# Patient Record
Sex: Male | Born: 1950 | Race: Black or African American | Hispanic: No | Marital: Married | Smoking: Former smoker
Health system: Southern US, Community
[De-identification: ages and names within clinical notes are randomized; demographics above are authoritative.]

## PROBLEM LIST (undated history)

## (undated) DIAGNOSIS — I10 Essential (primary) hypertension: Secondary | ICD-10-CM

## (undated) DIAGNOSIS — E119 Type 2 diabetes mellitus without complications: Secondary | ICD-10-CM

## (undated) DIAGNOSIS — I639 Cerebral infarction, unspecified: Secondary | ICD-10-CM

## (undated) DIAGNOSIS — R569 Unspecified convulsions: Secondary | ICD-10-CM

## (undated) HISTORY — PX: OTHER SURGICAL HISTORY: SHX169

---

## 2002-06-01 ENCOUNTER — Emergency Department (HOSPITAL_COMMUNITY): Admission: EM | Admit: 2002-06-01 | Discharge: 2002-06-01 | Payer: Self-pay | Admitting: Emergency Medicine

## 2002-06-11 ENCOUNTER — Emergency Department (HOSPITAL_COMMUNITY): Admission: EM | Admit: 2002-06-11 | Discharge: 2002-06-11 | Payer: Self-pay | Admitting: *Deleted

## 2002-06-22 ENCOUNTER — Emergency Department (HOSPITAL_COMMUNITY): Admission: EM | Admit: 2002-06-22 | Discharge: 2002-06-23 | Payer: Self-pay | Admitting: Emergency Medicine

## 2002-06-23 ENCOUNTER — Encounter: Payer: Self-pay | Admitting: Emergency Medicine

## 2002-06-26 ENCOUNTER — Ambulatory Visit (HOSPITAL_COMMUNITY): Admission: RE | Admit: 2002-06-26 | Discharge: 2002-06-26 | Payer: Self-pay | Admitting: Family Medicine

## 2002-06-26 ENCOUNTER — Encounter: Payer: Self-pay | Admitting: Family Medicine

## 2002-08-12 ENCOUNTER — Ambulatory Visit (HOSPITAL_COMMUNITY): Admission: RE | Admit: 2002-08-12 | Discharge: 2002-08-12 | Payer: Self-pay | Admitting: Gastroenterology

## 2003-06-23 ENCOUNTER — Emergency Department (HOSPITAL_COMMUNITY): Admission: EM | Admit: 2003-06-23 | Discharge: 2003-06-23 | Payer: Self-pay | Admitting: Emergency Medicine

## 2003-10-24 ENCOUNTER — Other Ambulatory Visit: Payer: Self-pay

## 2008-09-10 ENCOUNTER — Ambulatory Visit: Payer: Self-pay | Admitting: Gastroenterology

## 2009-10-30 ENCOUNTER — Emergency Department: Payer: Self-pay | Admitting: Internal Medicine

## 2013-02-26 ENCOUNTER — Ambulatory Visit: Payer: Self-pay | Admitting: Family

## 2013-05-16 ENCOUNTER — Ambulatory Visit: Payer: Self-pay | Admitting: Family

## 2013-09-10 ENCOUNTER — Ambulatory Visit: Payer: Self-pay | Admitting: Family

## 2013-10-28 DIAGNOSIS — R519 Headache, unspecified: Secondary | ICD-10-CM | POA: Insufficient documentation

## 2013-10-28 DIAGNOSIS — R55 Syncope and collapse: Secondary | ICD-10-CM | POA: Insufficient documentation

## 2013-10-28 DIAGNOSIS — Z8673 Personal history of transient ischemic attack (TIA), and cerebral infarction without residual deficits: Secondary | ICD-10-CM | POA: Insufficient documentation

## 2014-05-02 DIAGNOSIS — R569 Unspecified convulsions: Secondary | ICD-10-CM | POA: Insufficient documentation

## 2014-06-20 ENCOUNTER — Other Ambulatory Visit: Payer: Self-pay | Admitting: Family Medicine

## 2014-06-20 DIAGNOSIS — R55 Syncope and collapse: Secondary | ICD-10-CM

## 2014-06-23 ENCOUNTER — Other Ambulatory Visit: Payer: Self-pay

## 2014-06-23 ENCOUNTER — Ambulatory Visit: Payer: Medicare (Managed Care) | Attending: Family Medicine

## 2014-06-23 DIAGNOSIS — R55 Syncope and collapse: Secondary | ICD-10-CM | POA: Insufficient documentation

## 2015-06-14 ENCOUNTER — Inpatient Hospital Stay
Admission: EM | Admit: 2015-06-14 | Discharge: 2015-06-18 | DRG: 640 | Disposition: A | Discharge status: Home / Self Care | Payer: Medicare (Managed Care) | Attending: Internal Medicine | Admitting: Internal Medicine

## 2015-06-14 ENCOUNTER — Encounter: Payer: Self-pay | Admitting: Emergency Medicine

## 2015-06-14 ENCOUNTER — Emergency Department: Payer: Medicare (Managed Care)

## 2015-06-14 ENCOUNTER — Inpatient Hospital Stay: Payer: Medicare (Managed Care)

## 2015-06-14 DIAGNOSIS — I10 Essential (primary) hypertension: Secondary | ICD-10-CM | POA: Diagnosis present

## 2015-06-14 DIAGNOSIS — R0602 Shortness of breath: Secondary | ICD-10-CM

## 2015-06-14 DIAGNOSIS — N179 Acute kidney failure, unspecified: Secondary | ICD-10-CM | POA: Diagnosis present

## 2015-06-14 DIAGNOSIS — Z79899 Other long term (current) drug therapy: Secondary | ICD-10-CM | POA: Diagnosis not present

## 2015-06-14 DIAGNOSIS — R748 Abnormal levels of other serum enzymes: Secondary | ICD-10-CM | POA: Diagnosis present

## 2015-06-14 DIAGNOSIS — E119 Type 2 diabetes mellitus without complications: Secondary | ICD-10-CM | POA: Diagnosis present

## 2015-06-14 DIAGNOSIS — Z8673 Personal history of transient ischemic attack (TIA), and cerebral infarction without residual deficits: Secondary | ICD-10-CM

## 2015-06-14 DIAGNOSIS — Z809 Family history of malignant neoplasm, unspecified: Secondary | ICD-10-CM | POA: Diagnosis not present

## 2015-06-14 DIAGNOSIS — A419 Sepsis, unspecified organism: Secondary | ICD-10-CM | POA: Diagnosis present

## 2015-06-14 DIAGNOSIS — G8929 Other chronic pain: Secondary | ICD-10-CM | POA: Diagnosis present

## 2015-06-14 DIAGNOSIS — G9341 Metabolic encephalopathy: Secondary | ICD-10-CM | POA: Diagnosis present

## 2015-06-14 DIAGNOSIS — Z7901 Long term (current) use of anticoagulants: Secondary | ICD-10-CM | POA: Diagnosis not present

## 2015-06-14 DIAGNOSIS — I482 Chronic atrial fibrillation: Secondary | ICD-10-CM | POA: Diagnosis present

## 2015-06-14 DIAGNOSIS — Z833 Family history of diabetes mellitus: Secondary | ICD-10-CM

## 2015-06-14 DIAGNOSIS — G500 Trigeminal neuralgia: Secondary | ICD-10-CM | POA: Diagnosis present

## 2015-06-14 DIAGNOSIS — I4892 Unspecified atrial flutter: Secondary | ICD-10-CM | POA: Diagnosis present

## 2015-06-14 DIAGNOSIS — R410 Disorientation, unspecified: Secondary | ICD-10-CM

## 2015-06-14 DIAGNOSIS — R4182 Altered mental status, unspecified: Secondary | ICD-10-CM | POA: Diagnosis not present

## 2015-06-14 DIAGNOSIS — E860 Dehydration: Principal | ICD-10-CM | POA: Diagnosis present

## 2015-06-14 HISTORY — DX: Type 2 diabetes mellitus without complications: E11.9

## 2015-06-14 HISTORY — DX: Essential (primary) hypertension: I10

## 2015-06-14 HISTORY — DX: Cerebral infarction, unspecified: I63.9

## 2015-06-14 LAB — COMPREHENSIVE METABOLIC PANEL
ALBUMIN: 4 g/dL (ref 3.5–5.0)
ALK PHOS: 74 U/L (ref 38–126)
ALT: 13 U/L — AB (ref 17–63)
ALT: 15 U/L — AB (ref 17–63)
AST: 20 U/L (ref 15–41)
AST: 23 U/L (ref 15–41)
Albumin: 4.3 g/dL (ref 3.5–5.0)
Alkaline Phosphatase: 66 U/L (ref 38–126)
Anion gap: 12 (ref 5–15)
Anion gap: 11 (ref 5–15)
BILIRUBIN TOTAL: 0.5 mg/dL (ref 0.3–1.2)
BUN: 30 mg/dL — AB (ref 6–20)
BUN: 33 mg/dL — AB (ref 6–20)
CHLORIDE: 102 mmol/L (ref 101–111)
CO2: 27 mmol/L (ref 22–32)
CO2: 26 mmol/L (ref 22–32)
CREATININE: 1.57 mg/dL — AB (ref 0.61–1.24)
Calcium: 10 mg/dL (ref 8.9–10.3)
Calcium: 9.5 mg/dL (ref 8.9–10.3)
Chloride: 103 mmol/L (ref 101–111)
Creatinine, Ser: 1.49 mg/dL — ABNORMAL HIGH (ref 0.61–1.24)
GFR calc Af Amer: 52 mL/min — ABNORMAL LOW (ref >60)
GFR calc Af Amer: 55 mL/min — ABNORMAL LOW (ref >60)
GFR, EST NON AFRICAN AMERICAN: 45 mL/min — AB (ref >60)
GFR, EST NON AFRICAN AMERICAN: 48 mL/min — AB (ref >60)
Glucose, Bld: 178 mg/dL — ABNORMAL HIGH (ref 65–99)
Glucose, Bld: 196 mg/dL — ABNORMAL HIGH (ref 65–99)
POTASSIUM: 3.8 mmol/L (ref 3.5–5.1)
Potassium: 4.1 mmol/L (ref 3.5–5.1)
SODIUM: 139 mmol/L (ref 135–145)
Sodium: 142 mmol/L (ref 135–145)
TOTAL PROTEIN: 8.4 g/dL — AB (ref 6.5–8.1)
Total Bilirubin: 0.5 mg/dL (ref 0.3–1.2)
Total Protein: 7.8 g/dL (ref 6.5–8.1)

## 2015-06-14 LAB — LACTIC ACID, PLASMA
LACTIC ACID, VENOUS: 3.3 mmol/L — AB (ref 0.5–2.0)
Lactic Acid, Venous: 1.5 mmol/L (ref 0.5–2.0)

## 2015-06-14 LAB — CBC WITH DIFFERENTIAL/PLATELET
BASOS ABS: 0.1 10*3/uL (ref 0–0.1)
BASOS PCT: 1 %
Basophils Absolute: 0.1 10*3/uL (ref 0–0.1)
Basophils Relative: 1 %
EOS ABS: 0 10*3/uL (ref 0–0.7)
EOS PCT: 1 %
Eosinophils Absolute: 0.1 10*3/uL (ref 0–0.7)
Eosinophils Relative: 0 %
HCT: 45.8 % (ref 40.0–52.0)
HCT: 43.7 % (ref 40.0–52.0)
HEMOGLOBIN: 14.6 g/dL (ref 13.0–18.0)
Hemoglobin: 14 g/dL (ref 13.0–18.0)
LYMPHS ABS: 0.5 10*3/uL — AB (ref 1.0–3.6)
Lymphocytes Relative: 15 %
Lymphocytes Relative: 8 %
Lymphs Abs: 1.3 10*3/uL (ref 1.0–3.6)
MCH: 29.2 pg (ref 26.0–34.0)
MCH: 29.3 pg (ref 26.0–34.0)
MCHC: 31.8 g/dL — ABNORMAL LOW (ref 32.0–36.0)
MCHC: 32 g/dL (ref 32.0–36.0)
MCV: 91.3 fL (ref 80.0–100.0)
MCV: 92.3 fL (ref 80.0–100.0)
MONO ABS: 0.4 10*3/uL (ref 0.2–1.0)
MONO ABS: 0.9 10*3/uL (ref 0.2–1.0)
Monocytes Relative: 10 %
Monocytes Relative: 6 %
NEUTROS PCT: 85 %
Neutro Abs: 6.1 10*3/uL (ref 1.4–6.5)
Neutro Abs: 5.8 10*3/uL (ref 1.4–6.5)
Neutrophils Relative %: 73 %
PLATELETS: 162 10*3/uL (ref 150–440)
Platelets: 199 10*3/uL (ref 150–440)
RBC: 4.96 MIL/uL (ref 4.40–5.90)
RBC: 4.79 MIL/uL (ref 4.40–5.90)
RDW: 15.6 % — ABNORMAL HIGH (ref 11.5–14.5)
RDW: 15.7 % — AB (ref 11.5–14.5)
WBC: 6.8 10*3/uL (ref 3.8–10.6)
WBC: 8.5 10*3/uL (ref 3.8–10.6)

## 2015-06-14 LAB — BLOOD GAS, ARTERIAL
Acid-Base Excess: 0.9 mmol/L (ref 0.0–3.0)
Allens test (pass/fail): POSITIVE — AB
Bicarbonate: 24.2 mEq/L (ref 21.0–28.0)
FIO2: 0.21
O2 Saturation: 97.4 %
Patient temperature: 37
pCO2 arterial: 34 mmHg (ref 32.0–48.0)
pH, Arterial: 7.46 — ABNORMAL HIGH (ref 7.350–7.450)
pO2, Arterial: 90 mmHg (ref 83.0–108.0)

## 2015-06-14 LAB — URINE DRUG SCREEN, QUALITATIVE (ARMC ONLY)
AMPHETAMINES, UR SCREEN: NOT DETECTED
BARBITURATES, UR SCREEN: NOT DETECTED
BENZODIAZEPINE, UR SCRN: NOT DETECTED
CANNABINOID 50 NG, UR ~~LOC~~: NOT DETECTED
Cocaine Metabolite,Ur ~~LOC~~: POSITIVE — AB
MDMA (Ecstasy)Ur Screen: NOT DETECTED
Methadone Scn, Ur: NOT DETECTED
OPIATE, UR SCREEN: NOT DETECTED
PHENCYCLIDINE (PCP) UR S: NOT DETECTED
Tricyclic, Ur Screen: POSITIVE — AB

## 2015-06-14 LAB — URINALYSIS COMPLETE WITH MICROSCOPIC (ARMC ONLY)
BILIRUBIN URINE: NEGATIVE
GLUCOSE, UA: NEGATIVE mg/dL
Hgb urine dipstick: NEGATIVE
KETONES UR: NEGATIVE mg/dL
Nitrite: NEGATIVE
PROTEIN: 100 mg/dL — AB
SPECIFIC GRAVITY, URINE: 1.02 (ref 1.005–1.030)
pH: 5 (ref 5.0–8.0)

## 2015-06-14 LAB — PROTIME-INR
INR: 1.37
INR: 1.44
INR: 1.43
Prothrombin Time: 17 seconds — ABNORMAL HIGH (ref 11.4–15.0)
Prothrombin Time: 17.6 seconds — ABNORMAL HIGH (ref 11.4–15.0)
Prothrombin Time: 17.5 seconds — ABNORMAL HIGH (ref 11.4–15.0)

## 2015-06-14 LAB — APTT: APTT: 30 s (ref 24–36)

## 2015-06-14 LAB — PROCALCITONIN: PROCALCITONIN: 0.12 ng/mL

## 2015-06-14 LAB — ETHANOL: Alcohol, Ethyl (B): <5 mg/dL (ref <5)

## 2015-06-14 LAB — CK: Total CK: 54 U/L (ref 49–397)

## 2015-06-14 LAB — GLUCOSE, CAPILLARY: Glucose-Capillary: 177 mg/dL — ABNORMAL HIGH (ref 65–99)

## 2015-06-14 LAB — TROPONIN I: TROPONIN I: 0.09 ng/mL — AB (ref <0.031)

## 2015-06-14 MED ORDER — FELODIPINE ER 5 MG PO TB24
10.0000 mg | ORAL_TABLET | Freq: Two times a day (BID) | ORAL | Status: DC
  Administered 2015-06-16 – 2015-06-18 (×5): 10 mg via ORAL
  Filled 2015-06-14: qty 1
  Filled 2015-06-14 (×2): qty 2
  Filled 2015-06-14 (×3): qty 1
  Filled 2015-06-14 (×4): qty 2

## 2015-06-14 MED ORDER — SODIUM CHLORIDE 0.9 % IV SOLN
INTRAVENOUS | Status: DC
  Administered 2015-06-14 – 2015-06-17 (×7): via INTRAVENOUS

## 2015-06-14 MED ORDER — METOPROLOL SUCCINATE ER 50 MG PO TB24
150.0000 mg | ORAL_TABLET | Freq: Every day | ORAL | Status: DC
  Administered 2015-06-16 – 2015-06-17 (×2): 150 mg via ORAL
  Filled 2015-06-14 (×2): qty 1

## 2015-06-14 MED ORDER — ONDANSETRON HCL 4 MG/2ML IJ SOLN: 4.0000 mg | Freq: Four times a day (QID) | INTRAMUSCULAR | Status: DC | PRN

## 2015-06-14 MED ORDER — DEXTROSE 5 % IV SOLN
1.0000 g | INTRAVENOUS | Status: DC
  Administered 2015-06-14: 1 g via INTRAVENOUS
  Filled 2015-06-14 (×2): qty 10

## 2015-06-14 MED ORDER — DEXTROSE 5 % IV SOLN: 1.0000 g | INTRAVENOUS | Status: DC

## 2015-06-14 MED ORDER — HYDRALAZINE HCL 20 MG/ML IJ SOLN
10.0000 mg | Freq: Four times a day (QID) | INTRAMUSCULAR | Status: DC | PRN
  Administered 2015-06-14: 10 mg via INTRAVENOUS
  Filled 2015-06-14: qty 1

## 2015-06-14 MED ORDER — ISOSORBIDE DINITRATE 20 MG PO TABS
40.0000 mg | ORAL_TABLET | Freq: Two times a day (BID) | ORAL | Status: DC
  Administered 2015-06-15 – 2015-06-18 (×6): 40 mg via ORAL
  Filled 2015-06-14 (×10): qty 2

## 2015-06-14 MED ORDER — RENA-VITE PO TABS
1.0000 | ORAL_TABLET | Freq: Every day | ORAL | Status: DC
  Administered 2015-06-16 – 2015-06-18 (×3): 1 via ORAL
  Filled 2015-06-14 (×5): qty 1

## 2015-06-14 MED ORDER — LORAZEPAM 2 MG/ML IJ SOLN
1.0000 mg | Freq: Once | INTRAMUSCULAR | Status: AC
  Administered 2015-06-14: 1 mg via INTRAVENOUS
  Filled 2015-06-14: qty 1

## 2015-06-14 MED ORDER — LORAZEPAM 2 MG/ML IJ SOLN
2.0000 mg | Freq: Four times a day (QID) | INTRAMUSCULAR | Status: DC | PRN
  Administered 2015-06-14 – 2015-06-15 (×3): 2 mg via INTRAVENOUS
  Filled 2015-06-14 (×3): qty 1

## 2015-06-14 MED ORDER — SODIUM CHLORIDE 0.9 % IV BOLUS (SEPSIS): 500.0000 mL | INTRAVENOUS | Status: AC

## 2015-06-14 MED ORDER — CITALOPRAM HYDROBROMIDE 10 MG PO TABS
10.0000 mg | ORAL_TABLET | Freq: Every day | ORAL | Status: DC
  Administered 2015-06-16 – 2015-06-18 (×3): 10 mg via ORAL
  Filled 2015-06-14 (×3): qty 1

## 2015-06-14 MED ORDER — WARFARIN - PHARMACIST DOSING INPATIENT
Freq: Every day | Status: DC
  Administered 2015-06-15 – 2015-06-17 (×2)

## 2015-06-14 MED ORDER — VANCOMYCIN HCL IN DEXTROSE 1-5 GM/200ML-% IV SOLN
1000.0000 mg | Freq: Once | INTRAVENOUS | Status: AC
  Administered 2015-06-14: 1000 mg via INTRAVENOUS
  Filled 2015-06-14: qty 200

## 2015-06-14 MED ORDER — ACETAMINOPHEN 325 MG PO TABS
650.0000 mg | ORAL_TABLET | Freq: Four times a day (QID) | ORAL | Status: DC | PRN
  Administered 2015-06-15 – 2015-06-16 (×2): 650 mg via ORAL
  Filled 2015-06-14 (×2): qty 2

## 2015-06-14 MED ORDER — SODIUM CHLORIDE 0.9 % IV BOLUS (SEPSIS): 1000.0000 mL | INTRAVENOUS | Status: AC

## 2015-06-14 MED ORDER — HALOPERIDOL LACTATE 5 MG/ML IJ SOLN
5.0000 mg | Freq: Once | INTRAMUSCULAR | Status: AC
  Administered 2015-06-14: 5 mg via INTRAVENOUS

## 2015-06-14 MED ORDER — HYDRALAZINE HCL 50 MG PO TABS
75.0000 mg | ORAL_TABLET | Freq: Two times a day (BID) | ORAL | Status: DC
  Administered 2015-06-15 – 2015-06-18 (×6): 75 mg via ORAL
  Filled 2015-06-14 (×6): qty 1

## 2015-06-14 MED ORDER — DEXTROSE 5 % IV SOLN: 2.0000 g | Freq: Once | INTRAVENOUS | Status: DC

## 2015-06-14 MED ORDER — TIMOLOL MALEATE 0.5 % OP SOLN
1.0000 [drp] | Freq: Every day | OPHTHALMIC | Status: DC
  Administered 2015-06-14 – 2015-06-17 (×4): 1 [drp] via OPHTHALMIC
  Filled 2015-06-14: qty 5

## 2015-06-14 MED ORDER — MAGNESIUM OXIDE 400 (241.3 MG) MG PO TABS
400.0000 mg | ORAL_TABLET | Freq: Every day | ORAL | Status: DC
  Administered 2015-06-16 – 2015-06-18 (×3): 400 mg via ORAL
  Filled 2015-06-14 (×3): qty 1

## 2015-06-14 MED ORDER — HALOPERIDOL LACTATE 5 MG/ML IJ SOLN: 1.0000 mg | Freq: Four times a day (QID) | INTRAMUSCULAR | Status: DC | PRN

## 2015-06-14 MED ORDER — ISOSORB DINITRATE-HYDRALAZINE 20-37.5 MG PO TABS
2.0000 | ORAL_TABLET | Freq: Two times a day (BID) | ORAL | Status: DC
  Filled 2015-06-14 (×3): qty 2

## 2015-06-14 MED ORDER — ACETAMINOPHEN 650 MG RE SUPP: 650.0000 mg | Freq: Four times a day (QID) | RECTAL | Status: DC | PRN

## 2015-06-14 MED ORDER — NORTRIPTYLINE HCL 10 MG PO CAPS
20.0000 mg | ORAL_CAPSULE | Freq: Every day | ORAL | Status: DC
  Administered 2015-06-16 – 2015-06-17 (×2): 20 mg via ORAL
  Filled 2015-06-14 (×5): qty 2

## 2015-06-14 MED ORDER — HALOPERIDOL LACTATE 5 MG/ML IJ SOLN
2.5000 mg | Freq: Four times a day (QID) | INTRAMUSCULAR | Status: DC | PRN
  Administered 2015-06-14: 2.5 mg via INTRAVENOUS
  Filled 2015-06-14: qty 1

## 2015-06-14 MED ORDER — ONDANSETRON HCL 4 MG PO TABS: 4.0000 mg | ORAL_TABLET | Freq: Four times a day (QID) | ORAL | Status: DC | PRN

## 2015-06-14 MED ORDER — AMLODIPINE BESYLATE 5 MG PO TABS
5.0000 mg | ORAL_TABLET | Freq: Every day | ORAL | Status: DC
  Administered 2015-06-16 – 2015-06-17 (×2): 5 mg via ORAL
  Filled 2015-06-14 (×2): qty 1

## 2015-06-14 MED ORDER — LORAZEPAM 2 MG/ML IJ SOLN
1.0000 mg | Freq: Once | INTRAMUSCULAR | Status: AC
  Administered 2015-06-14: 1 mg via INTRAVENOUS

## 2015-06-14 MED ORDER — CARBAMAZEPINE 200 MG PO TABS
200.0000 mg | ORAL_TABLET | Freq: Three times a day (TID) | ORAL | Status: DC
  Administered 2015-06-15 – 2015-06-18 (×8): 200 mg via ORAL
  Filled 2015-06-14 (×15): qty 1

## 2015-06-14 MED ORDER — ACETAMINOPHEN 650 MG RE SUPP
975.0000 mg | Freq: Once | RECTAL | Status: AC
  Administered 2015-06-14: 975 mg via RECTAL
  Filled 2015-06-14: qty 1

## 2015-06-14 MED ORDER — PIPERACILLIN-TAZOBACTAM 3.375 G IVPB
3.3750 g | Freq: Three times a day (TID) | INTRAVENOUS | Status: DC
  Administered 2015-06-14 – 2015-06-16 (×7): 3.375 g via INTRAVENOUS
  Filled 2015-06-14 (×9): qty 50

## 2015-06-14 MED ORDER — SODIUM CHLORIDE 0.9 % IV BOLUS (SEPSIS)
1000.0000 mL | INTRAVENOUS | Status: AC
  Administered 2015-06-14 (×2): 1000 mL via INTRAVENOUS

## 2015-06-14 MED ORDER — ATORVASTATIN CALCIUM 20 MG PO TABS
40.0000 mg | ORAL_TABLET | Freq: Every day | ORAL | Status: DC
  Administered 2015-06-16 – 2015-06-18 (×3): 40 mg via ORAL
  Filled 2015-06-14 (×3): qty 2

## 2015-06-14 MED ORDER — SODIUM CHLORIDE 0.9% FLUSH
3.0000 mL | Freq: Two times a day (BID) | INTRAVENOUS | Status: DC
  Administered 2015-06-14 – 2015-06-18 (×5): 3 mL via INTRAVENOUS

## 2015-06-14 MED ORDER — HALOPERIDOL LACTATE 5 MG/ML IJ SOLN
INTRAMUSCULAR | Status: AC
  Administered 2015-06-14: 5 mg via INTRAVENOUS
  Filled 2015-06-14: qty 1

## 2015-06-14 MED ORDER — WARFARIN SODIUM 5 MG PO TABS
11.5000 mg | ORAL_TABLET | Freq: Every day | ORAL | Status: DC
  Administered 2015-06-15: 11.5 mg via ORAL
  Filled 2015-06-14: qty 2

## 2015-06-14 MED ORDER — MORPHINE SULFATE (PF) 2 MG/ML IV SOLN
2.0000 mg | INTRAVENOUS | Status: DC | PRN
  Administered 2015-06-14 – 2015-06-15 (×5): 2 mg via INTRAVENOUS
  Filled 2015-06-14 (×5): qty 1

## 2015-06-14 MED ORDER — VANCOMYCIN HCL IN DEXTROSE 1-5 GM/200ML-% IV SOLN
1000.0000 mg | INTRAVENOUS | Status: DC
  Administered 2015-06-14 – 2015-06-16 (×3): 1000 mg via INTRAVENOUS
  Filled 2015-06-14 (×4): qty 200

## 2015-06-14 NOTE — H&P (Signed)
[facility] - [place] at [facility]   PATIENT NAME: Lee Gonzalez    MR#:  [id number]  DATE OF BIRTH:  September 15, 1950  DATE OF ADMISSION:  06/14/2015  PRIMARY CARE PHYSICIAN: No PCP Per Patient   REQUESTING/REFERRING PHYSICIAN:   CHIEF COMPLAINT:   Chief Complaint  Patient presents with  . Altered Mental Status    HISTORY OF PRESENT ILLNESS: Lee Gonzalez  is a 65 y.o. male with a known history of cva,diabetes mellitus,hypertension on oral coumadin as outpatient presented to ER with confusion and change in mental status.This has been going on since yesterday.Patient is disoriented to time,place and person.Responds to painful commands.He was given iv ativan and haldol in ER for agitation.Not much history could be obtained from the patient.He was worked with CT head which showed old cva but no new intracranial abnormalities.In the ER patient was febrile and appeared dehydrated.According to family members he is not drinking any fluids and oral intake is poor too.Sepsis protocol started in ER.  PAST MEDICAL HISTORY:   Past Medical History  Diagnosis Date  . Stroke (HCC)   . Diabetes mellitus without complication (HCC)   . Hypertension     PAST SURGICAL HISTORY: Past Surgical History  Procedure Laterality Date  . None      SOCIAL HISTORY:  Social History  Substance Use Topics  . Smoking status: Never Smoker   . Smokeless tobacco: Not on file  . Alcohol Use: No    FAMILY HISTORY:  Family History  Problem Relation Age of Onset  . Diabetes Mellitus II Father   . Cancer Mother     DRUG ALLERGIES: No Known Allergies  REVIEW OF SYSTEMS:  Could not be obtained secondary to confusion and altered mental state.  MEDICATIONS AT HOME:  Prior to Admission medications   Medication Sig Start Date End Date Taking? Authorizing Provider  amLODipine (NORVASC) 5 MG tablet Take 5 mg by mouth daily.   Yes Historical Provider, MD  atorvastatin (LIPITOR) 40 MG  tablet Take 40 mg by mouth daily.   Yes Historical Provider, MD  b complex vitamins capsule Take 1 capsule by mouth daily.   Yes Historical Provider, MD  carbamazepine (TEGRETOL) 200 MG tablet Take 200 mg by mouth 3 (three) times daily.   Yes Historical Provider, MD  citalopram (CELEXA) 10 MG tablet Take 10 mg by mouth daily.   Yes Historical Provider, MD  clonazePAM (KLONOPIN) 0.5 MG tablet Take 0.5 mg by mouth at bedtime.   Yes Historical Provider, MD  felodipine (PLENDIL) 10 MG 24 hr tablet Take 10 mg by mouth 2 (two) times daily.   Yes Historical Provider, MD  furosemide (LASIX) 20 MG tablet Take 20 mg by mouth every Monday, Wednesday, and Friday.   Yes Historical Provider, MD  isosorbide-hydrALAZINE (BIDIL) 20-37.5 MG tablet Take 2 tablets by mouth 2 (two) times daily.   Yes Historical Provider, MD  loratadine (CLARITIN) 10 MG tablet Take 10 mg by mouth daily.   Yes Historical Provider, MD  magnesium oxide (MAG-OX) 400 (241.3 Mg) MG tablet Take 400 mg by mouth daily.   Yes Historical Provider, MD  metoprolol succinate (TOPROL-XL) 50 MG 24 hr tablet Take 150 mg by mouth daily. Take with or immediately following a meal.   Yes Historical Provider, MD  nortriptyline (PAMELOR) 10 MG capsule Take 20 mg by mouth at bedtime.   Yes Historical Provider, MD  quinapril-hydrochlorothiazide (ACCURETIC) 20-12.5 MG tablet Take 3 tablets by mouth daily.   Yes  Historical Provider, MD  sitaGLIPtin-metformin (JANUMET) 50-1000 MG tablet Take 1 tablet by mouth daily.   Yes Historical Provider, MD  timolol (TIMOPTIC) 0.5 % ophthalmic solution Place 1 drop into both eyes at bedtime.   Yes Historical Provider, MD  warfarin (COUMADIN) 4 MG tablet Take 4 mg by mouth daily. Pt takes with a 7.5mg  tablet.   Yes Historical Provider, MD  warfarin (COUMADIN) 7.5 MG tablet Take 7.5 mg by mouth daily. Pt takes with a  tablet.   Yes Historical Provider, MD      PHYSICAL EXAMINATION:   VITAL SIGNS: Blood pressure 174/94,  pulse 78, temperature 100.8 F (38.2 C), temperature source Rectal, resp. rate 16, height  (1.727 m), weight 68.04 kg (150 lb), SpO2 91 %.  GENERAL:  65 y.o.-year-old patient lying in the bed with decreased responsiveness.  EYES: Pupils equal, round, reactive to light and accommodation. No scleral icterus. Extraocular muscles intact.  HEENT: Head atraumatic, normocephalic. Oropharynx dry and nasopharynx clear.  NECK:  Supple, no jugular venous distention. No thyroid enlargement, no tenderness.  LUNGS: Normal breath sounds bilaterally, scattered rales noted. No use of accessory muscles of respiration.  CARDIOVASCULAR: S1, S2 normal. No murmurs, rubs, or gallops.  ABDOMEN: Soft, nontender, nondistended. Bowel sounds present. No organomegaly or mass.  EXTREMITIES: No pedal edema, cyanosis, or clubbing.  NEUROLOGIC: Not oriented to time,place and person PSYCHIATRIC: Mood could not be assessed. SKIN: No obvious rash, lesion, or ulcer.   LABORATORY PANEL:   CBC  Recent Labs Lab 06/14/15 0047  WBC 8.5  HGB 14.6  HCT 45.8  PLT 199  MCV 92.3  MCH 29.3  MCHC 31.8*  RDW 15.6*  LYMPHSABS 1.3  MONOABS 0.9  EOSABS 0.1  BASOSABS 0.1   ------------------------------------------------------------------------------------------------------------------  Chemistries   Recent Labs Lab 06/14/15 0047  NA 142  K 4.1  CL 103  CO2 27  GLUCOSE 178*  BUN 33*  CREATININE 1.57*  CALCIUM 10.0  AST 23  ALT 15*  ALKPHOS 74  BILITOT 0.5   ------------------------------------------------------------------------------------------------------------------ estimated creatinine clearance is 45.1 mL/min (by C-G formula based on Cr of 1.57). ------------------------------------------------------------------------------------------------------------------ No results for input(s): TSH, T4TOTAL, T3FREE, THYROIDAB in the last 72 hours.  Invalid input(s): FREET3   Coagulation profile  Recent  Labs Lab 06/14/15 0047  INR 1.37   ------------------------------------------------------------------------------------------------------------------- No results for input(s): DDIMER in the last 72 hours. -------------------------------------------------------------------------------------------------------------------  Cardiac Enzymes  Recent Labs Lab 06/14/15 0047  TROPONINI 0.09*   ------------------------------------------------------------------------------------------------------------------ Invalid input(s): POCBNP  ---------------------------------------------------------------------------------------------------------------  Urinalysis    Component Value Date/Time   COLORURINE YELLOW* 06/14/2015 0105   APPEARANCEUR CLEAR* 06/14/2015 0105   LABSPEC 1.020 06/14/2015 0105   PHURINE 5.0 06/14/2015 0105   GLUCOSEU NEGATIVE 06/14/2015 0105   HGBUR NEGATIVE 06/14/2015 0105   BILIRUBINUR NEGATIVE 06/14/2015 0105   KETONESUR NEGATIVE 06/14/2015 0105   PROTEINUR 100* 06/14/2015 0105   NITRITE NEGATIVE 06/14/2015 0105   LEUKOCYTESUR TRACE* 06/14/2015 0105     RADIOLOGY: Ct Head Wo Contrast  06/14/2015  CLINICAL DATA:  Acute onset of altered mental status. Initial encounter. EXAM: CT HEAD WITHOUT CONTRAST TECHNIQUE: Contiguous axial images were obtained from the base of the skull through the vertex without intravenous contrast. COMPARISON:  CT of the head performed 10/30/2009, and MRI of the brain performed 02/26/2013 FINDINGS: There is no evidence of acute infarction, mass lesion, or intra- or extra-axial hemorrhage on CT. Prominence of the ventricles and sulci reflects moderate cortical volume loss. Chronic infarcts are noted at the left  parietal lobe, with associated encephalomalacia. A small chronic lacunar infarct is noted at the left basal ganglia. Mild cerebellar atrophy is noted. The brainstem and fourth ventricle are within normal limits. No mass effect or midline shift  is seen. There is no evidence of fracture; visualized osseous structures are unremarkable in appearance. The orbits are within normal limits. The paranasal sinuses and mastoid air cells are well-aerated. No significant soft tissue abnormalities are seen. IMPRESSION: 1. No acute intracranial pathology seen on CT. 2. Moderate cortical volume loss. 3. Chronic infarcts at the left parietal lobe, with associated encephalomalacia. 4. Small chronic lacunar infarct at the left basal ganglia. Electronically Signed   By: Roanna RaiderJeffery  Chang M.D.   On: 06/14/2015 01:59    EKG: Orders placed or performed during the hospital encounter of 06/14/15  . ED EKG  . ED EKG  . EKG 12-Lead  . EKG 12-Lead    IMPRESSION AND PLAN: 65 yr old male patient with history of cva,hypertension presented to ER with confusion and fever. Admitting Diagnosis 1.Sepsis 2.Altered mental status could be from sepsis 3.Dehydration 4.Mildly elevated troponin could be secondary to demand ischemia 5.Hypertension 6.H/O CVA Treatment Plan : Admit patient to telemetry IV fluid hydration Hold diuretics Start patient on iv vancomycin and iv zosyn antibiotics Cycle troponin to check for ischemia Resume coumadin for anticoagulation Supportive care  All the records are reviewed and case discussed with ED provider. Management plans discussed with the patient, family and they are in agreement.  CODE STATUS:FULL    Code Status Orders        Start     Ordered   06/14/15 0258  Full code   Continuous     06/14/15 0258    Code Status History    Date Active Date Inactive Code Status Order ID Comments User Context   This patient has a current code status but no historical code status.    Advance Directive Documentation        Most Recent Value   Type of Advance Directive  Advance instruction for mental health treatment, Healthcare Power of Attorney   Pre-existing out of facility DNR order (yellow form or pink MOST form)     "MOST"  Form in Place?         TOTAL TIME TAKING CARE OF THIS PATIENT: 53 minutes.    Ihor AustinPavan Ankith Edmonston M.D on 06/14/2015 at 3:00 AM  Between 7am to 6pm - Pager - [phone]  After 6pm go to [email or web address] - password [email or web address] [facility]  [place] [facility] [place] [facility]  Office  [phone]  CC: Primary care physician; No PCP Per Patient

## 2015-06-14 NOTE — Progress Notes (Signed)
Patient admitted from home, with family at side. AMS. Patient non-verbal at this time. Only responding to painful stimuli. Tele verified with Marchelle FolksAmanda, CNA. Skin verified with [place]. On RA. Agitated at times, in relation to excess stimuli (Vital signs being taken, blood draws). Usually calms down when left alone. Dr. Tobi BastosPyreddy notified of continued agitation. Ativan ordered PRN. IV fluids infusing. IV antibiotics given.

## 2015-06-14 NOTE — Progress Notes (Signed)
Current EKG shows QTc of 456. Dr. Loretha BrasilZeylikman notified and stated okay to go forward with haldol as long as QTc below 500. Will recheck EKG daily per MD. MD stated that if 2.5mg  of haldol does not work, change order to 5mg  Q6 hours. Patient is currently resting after ativan, so will wait until haldol is needed.

## 2015-06-14 NOTE — Progress Notes (Signed)
Patient's breathing status has changed in the last 10 minutes. Respirations are in the upper 20's at times and patient's is having apneic periods in breathing. Oxygen saturation is 99-100%, heart rate in the 80's. Lungs sound the same as this AM. Dr. Allena KatzPatel paged and stated to do a stat chest xray and ABG.

## 2015-06-14 NOTE — Progress Notes (Addendum)
Pharmacy Antibiotic Note  Lee Gonzalez is a 65 y.o. male admitted on 06/14/2015 with UTI/urosepsis.  Pharmacy has been consulted for vancomycin and Zosyn dosing. Ceftriaxone was discontinued. Plan: 1. Zosyn 3.375 gm IV Q8H EI.  2. Vancomycin 1 gm IV Q18H with stacked dosing, second dose approximately 9 hours after first, predicted trough 19 mcg/mL. Pharmacy will continue to follow and adjust as needed to maintain trough 15 to 20 mcg/mL.  Vd 47 L, Ke 0.041 hr-1, T1/2 16.7 hr  Height: 5\' 8"  (172.7 cm) Weight: 150 lb (68.04 kg) IBW/kg (Calculated) : 68.4  Temp (24hrs), Avg:100.8 F (38.2 C), Min:100.8 F (38.2 C), Max:100.8 F (38.2 C)   Recent Labs Lab 06/14/15 0047 06/14/15 0112  WBC 8.5  --   CREATININE 1.57*  --   LATICACIDVEN  --  3.3*    Estimated Creatinine Clearance: 45.1 mL/min (by C-G formula based on Cr of 1.57).    No Known Allergies  Thank you for allowing pharmacy to be a part of this patient's care.  Carola FrostNathan A Redina Zeller, Pharm.D., BCPS Clinical Pharmacist 06/14/2015 3:00 AM

## 2015-06-14 NOTE — Progress Notes (Signed)
Patient ID: Girtha Hake, male   DOB: Feb 06, 1951, 65 y.o.   MRN: [id number] [facility] - [place] at [facility]   PATIENT NAME: Lee Gonzalez    MR#:  [id number]  DATE OF BIRTH:  [birth year]  SUBJECTIVE:   Came in with Altered mental status REVIEW OF SYSTEMS:   Review of Systems  Unable to perform ROS: mental acuity   Tolerating Diet:npo Tolerating PT: pending  DRUG ALLERGIES:  No Known Allergies  VITALS:  Blood pressure 193/166, pulse 96, temperature 98.7 F (37.1 C), temperature source Oral, resp. rate 22, height  (1.727 m), weight 65.363 kg (144 lb 1.6 oz), SpO2 98 %.  PHYSICAL EXAMINATION:   Physical Exam  GENERAL:  65 y.o.-year-old patient lying in the bed with no acute distress.  EYES: Pupils equal, round, reactive to light and accommodation. No scleral icterus. Extraocular muscles intact.  HEENT: Head atraumatic, normocephalic. Oropharynx and nasopharynx clear.  NECK:  Supple, no jugular venous distention. No thyroid enlargement, no tenderness.  LUNGS: Normal breath sounds bilaterally, no wheezing, rales, rhonchi. No use of accessory muscles of respiration.  CARDIOVASCULAR: S1, S2 normal. No murmurs, rubs, or gallops.  ABDOMEN: Soft, nontender, nondistended. Bowel sounds present. No organomegaly or mass.  EXTREMITIES: No cyanosis, clubbing or edema b/l.    NEUROLOGIC: Unable to assess due to agitation PSYCHIATRIC:  patient is agitated and restless earlier SKIN: No obvious rash, lesion, or ulcer.   LABORATORY PANEL:  CBC  Recent Labs Lab 06/14/15 0523  WBC 6.8  HGB 14.0  HCT 43.7  PLT 162    Chemistries   Recent Labs Lab 06/14/15 0523  NA 139  K 3.8  CL 102  CO2 26  GLUCOSE 196*  BUN 30*  CREATININE 1.49*  CALCIUM 9.5  AST 20  ALT 13*  ALKPHOS 66  BILITOT 0.5   Cardiac Enzymes  Recent Labs Lab 06/14/15 0047  TROPONINI 0.09*   RADIOLOGY:  Ct Head Wo Contrast  06/14/2015  CLINICAL DATA:  Acute  onset of altered mental status. Initial encounter. EXAM: CT HEAD WITHOUT CONTRAST TECHNIQUE: Contiguous axial images were obtained from the base of the skull through the vertex without intravenous contrast. COMPARISON:  CT of the head performed 10/30/2009, and MRI of the brain performed 02/26/2013 FINDINGS: There is no evidence of acute infarction, mass lesion, or intra- or extra-axial hemorrhage on CT. Prominence of the ventricles and sulci reflects moderate cortical volume loss. Chronic infarcts are noted at the left parietal lobe, with associated encephalomalacia. A small chronic lacunar infarct is noted at the left basal ganglia. Mild cerebellar atrophy is noted. The brainstem and fourth ventricle are within normal limits. No mass effect or midline shift is seen. There is no evidence of fracture; visualized osseous structures are unremarkable in appearance. The orbits are within normal limits. The paranasal sinuses and mastoid air cells are well-aerated. No significant soft tissue abnormalities are seen. IMPRESSION: 1. No acute intracranial pathology seen on CT. 2. Moderate cortical volume loss. 3. Chronic infarcts at the left parietal lobe, with associated encephalomalacia. 4. Small chronic lacunar infarct at the left basal ganglia. Electronically Signed   By: Roanna Raider M.D.   On: 06/14/2015 01:59   Dg Chest Port 1 View  06/14/2015  CLINICAL DATA:  Acute onset of agitation and altered mental status. Initial encounter. EXAM: PORTABLE CHEST 1 VIEW COMPARISON:  None. FINDINGS: The lungs are well-aerated. Vascular congestion is noted. Mild bibasilar opacities may reflect mild interstitial edema. There is  no evidence of pleural effusion or pneumothorax. The cardiomediastinal silhouette is borderline enlarged. No acute osseous abnormalities are seen. IMPRESSION: Vascular congestion and borderline cardiomegaly. Mild bibasilar opacities may reflect mild interstitial edema. Electronically Signed   By: Roanna RaiderJeffery   Chang M.D.   On: 06/14/2015 03:18   ASSESSMENT AND PLAN:   65 yr old male patient with history of cva,hypertension presented to ER with confusion and fever.  1.Sepsis unclear etiology. -Patient's chest x-ray negative. UA negative. -CT head shows old stroke -Neurology consultation for possible LP to rule out meningitis -Empiric Vanco and Zosyn -Lactic acid improved -Continue IV hydration  2.Altered mental status could be from sepsis  3.Dehydration -IV fluids  4.Mildly elevated troponin could be secondary to demand ischemia -No cardiac history  5.Hypertension -When necessary hydralazine  6.H/O CVA Per rectal aspirin Case discussed with Care Management/Social Worker. Management plans discussed with the  family and they are in agreement.  CODE STATUS: DO NOT RESUSCITATE (this [facility] who reviewed the notes and inform me about patient's CODE STATUS )  DVT Prophylaxis: Lovenox  TOTAL TIME TAKING CARE OF THIS PATIENT:40* minutes.  >50% time spent on counselling and coordination of care with neuro, pt's wife and [facility] MD  POSSIBLE D/C IN 2-3 DAYS, DEPENDING ON CLINICAL CONDITION.  Note: This dictation was prepared with Dragon dictation along with smaller phrase technology. Any transcriptional errors that result from this process are unintentional.  Chanler Schreiter M.D on 06/14/2015 at 11:09 AM  Between 7am to 6pm - Pager - [phone]  After 6pm go to [email or web address] - password [email or web address] [email or web address]  [place] [place] [facility]  Office  [phone]  CC: Primary care physician; No PCP Per Patient

## 2015-06-14 NOTE — Progress Notes (Signed)
ANTICOAGULATION CONSULT NOTE - Initial Consult  Pharmacy Consult warfarin Indication: history of CVA  No Known Allergies  Patient Measurements: Height: 5\' 8"  (172.7 cm) Weight: 150 lb (68.04 kg) IBW/kg (Calculated) : 68.4 Heparin Dosing Weight:   Vital Signs: Temp: 100.8 F (38.2 C) (04/30 0116) Temp Source: Rectal (04/30 0116) BP: 174/94 mmHg (04/30 0230) Pulse Rate: 78 (04/30 0200)  Labs:  Recent Labs  06/14/15 0047  HGB 14.6  HCT 45.8  PLT 199  LABPROT 17.0*  INR 1.37  CREATININE 1.57*  CKTOTAL 54  TROPONINI 0.09*    Estimated Creatinine Clearance: 45.1 mL/min (by C-G formula based on Cr of 1.57).   Medical History: Past Medical History  Diagnosis Date  . Stroke (HCC)   . Diabetes mellitus without complication (HCC)   . Hypertension     Medications:  Infusions:  . sodium chloride    . cefTRIAXone (ROCEPHIN)  IV    . sodium chloride 1,000 mL (06/14/15 0211)   Followed by  . sodium chloride      Assessment: 64 yom with PMH CVA on VKA, INR 1.37.  Goal of Therapy:  INR 2-3 Monitor platelets by anticoagulation protocol: Yes   Plan:  Resume home dose warfarin 11.5 mg po daily. Pharmacy will monitor INR daily.  Carola FrostNathan A Jalessa Peyser, Pharm.D., BCPS Clinical Pharmacist 06/14/2015,3:04 AM

## 2015-06-14 NOTE — ED Provider Notes (Signed)
[facility] Emergency Department Provider Note   ____________________________________________  Time seen: Approximately 1:03 AM  I have reviewed the triage vital signs and the nursing notes.   HISTORY  Chief Complaint Altered Mental Status  Limited secondary to altered mental status  HPI Lee Gonzalez is a 65 y.o. male who presents to the ED from home via EMS with a chief complaint of altered mental status. Patient has a history of stroke and atrial fibrillation, on warfarin. Wife states patient takes "pain medicine" (nortriptyline) for chronic pain secondary to trigeminal neuralgia. Wife reports she gave him his nighttime pain medicine (nortriptyline) approximately 9 PM and states he "went crazy". She finally called her children prior to arrival because she was exhausted and could no longer take care of him secondary to his agitated state. States patient was in his usual state of health and has had no recent illnesses. Specifically denies recent fever, chills, chest pain, shortness of breath, abdominal pain, nausea, vomiting, diarrhea, dysuria. Recent travel or trauma.   Past Medical History  Diagnosis Date  . Stroke (HCC)   . Diabetes mellitus without complication (HCC)   . Hypertension     There are no active problems to display for this patient.   History reviewed. No pertinent past surgical history.  Current Outpatient Rx  Name  Route  Sig  Dispense  Refill  . amLODipine (NORVASC) 5 MG tablet   Oral   Take 5 mg by mouth daily.         Marland Kitchen atorvastatin (LIPITOR) 40 MG tablet   Oral   Take 40 mg by mouth daily.         Marland Kitchen b complex vitamins capsule   Oral   Take 1 capsule by mouth daily.         . carbamazepine (TEGRETOL) 200 MG tablet   Oral   Take 200 mg by mouth 3 (three) times daily.         . citalopram (CELEXA) 10 MG tablet   Oral   Take 10 mg by mouth daily.         . clonazePAM (KLONOPIN) 0.5 MG tablet    Oral   Take 0.5 mg by mouth at bedtime.         . felodipine (PLENDIL) 10 MG 24 hr tablet   Oral   Take 10 mg by mouth 2 (two) times daily.         . furosemide (LASIX) 20 MG tablet   Oral   Take 20 mg by mouth every Monday, Wednesday, and Friday.         . isosorbide-hydrALAZINE (BIDIL) 20-37.5 MG tablet   Oral   Take 2 tablets by mouth 2 (two) times daily.         Marland Kitchen loratadine (CLARITIN) 10 MG tablet   Oral   Take 10 mg by mouth daily.         . magnesium oxide (MAG-OX) 400 (241.3 Mg) MG tablet   Oral   Take 400 mg by mouth daily.         . metoprolol succinate (TOPROL-XL) 50 MG 24 hr tablet   Oral   Take 150 mg by mouth daily. Take with or immediately following a meal.         . nortriptyline (PAMELOR) 10 MG capsule   Oral   Take 20 mg by mouth at bedtime.         . quinapril-hydrochlorothiazide (ACCURETIC) 20-12.5 MG tablet  Oral   Take 3 tablets by mouth daily.         . sitaGLIPtin-metformin (JANUMET) 50-1000 MG tablet   Oral   Take 1 tablet by mouth daily.         . timolol (TIMOPTIC) 0.5 % ophthalmic solution   Both Eyes   Place 1 drop into both eyes at bedtime.         Marland Kitchen warfarin (COUMADIN) 4 MG tablet   Oral   Take 4 mg by mouth daily. Pt takes with a 7.5mg  tablet.         . warfarin (COUMADIN) 7.5 MG tablet   Oral   Take 7.5 mg by mouth daily. Pt takes with a  tablet.           Allergies Review of patient's allergies indicates no known allergies.  No family history on file.  Social History Social History  Substance Use Topics  . Smoking status: Never Smoker   . Smokeless tobacco: None  . Alcohol Use: No    Review of Systems  Constitutional: No fever/chills. Eyes: No visual changes. ENT: No sore throat. Cardiovascular: Denies chest pain. Respiratory: Denies shortness of breath. Gastrointestinal: No abdominal pain.  No nausea, no vomiting.  No diarrhea.  No constipation. Genitourinary: Negative for  dysuria. Musculoskeletal: Negative for back pain. Skin: Negative for rash. Neurological: Positive for altered mental status. Negative for headaches, focal weakness or numbness.  10-point ROS otherwise negative.  ____________________________________________   PHYSICAL EXAM:  VITAL SIGNS: ED Triage Vitals  Enc Vitals Group     BP 06/14/15 0053 148/118 mmHg     Pulse Rate 06/14/15 0053 96     Resp 06/14/15 0053 30     Temp --      Temp src --      SpO2 06/14/15 0053 99 %     Weight 06/14/15 0053 150 lb (68.04 kg)     Height 06/14/15 0053  (1.727 m)     Head Cir --      Peak Flow --      Pain Score --      Pain Loc --      Pain Edu? --      Excl. in GC? --     Constitutional: Alert and oriented. Cachectic appearing and in moderate acute distress. Agitated and combative. Eyes: Conjunctivae are normal. PERRL. EOMI. Head: Atraumatic. Nose: No congestion/rhinnorhea. Mouth/Throat: Mucous membranes are moist.  Oropharynx non-erythematous. Neck: No stridor.  Supple neck without meningismus. Cardiovascular: Normal rate, irregular rhythm. Grossly normal heart sounds.  Good peripheral circulation. Respiratory: Normal respiratory effort.  No retractions. Lungs CTAB. Gastrointestinal: Soft and nontender. No distention. No abdominal bruits. No CVA tenderness. Musculoskeletal: No lower extremity tenderness nor edema.  No joint effusions. Neurologic:  Unable to assess secondary to agitation and combativeness. Skin:  Skin is warm, dry and intact. No rash noted. Psychiatric: Unable to assess.  ____________________________________________   LABS (all labs ordered are listed, but only abnormal results are displayed)  Labs Reviewed  CBC WITH DIFFERENTIAL/PLATELET - Abnormal; Notable for the following:    MCHC 31.8 (*)    RDW 15.6 (*)    All other components within normal limits  COMPREHENSIVE METABOLIC PANEL - Abnormal; Notable for the following:    Glucose, Bld 178 (*)    BUN 33  (*)    Creatinine, Ser 1.57 (*)    Total Protein 8.4 (*)    ALT 15 (*)    GFR calc  non Af Amer 45 (*)    GFR calc Af Amer 52 (*)    All other components within normal limits  TROPONIN I - Abnormal; Notable for the following:    Troponin I 0.09 (*)    All other components within normal limits  GLUCOSE, CAPILLARY - Abnormal; Notable for the following:    Glucose-Capillary 177 (*)    All other components within normal limits  PROTIME-INR - Abnormal; Notable for the following:    Prothrombin Time 17.0 (*)    All other components within normal limits  CULTURE, BLOOD (ROUTINE X 2)  CULTURE, BLOOD (ROUTINE X 2)  ETHANOL  LACTIC ACID, PLASMA  LACTIC ACID, PLASMA  URINE DRUG SCREEN, QUALITATIVE ([facility] ONLY)  URINALYSIS COMPLETEWITH MICROSCOPIC ([facility] ONLY)   ____________________________________________  EKG  ED ECG REPORT I, SUNG,JADE J, the attending physician, personally viewed and interpreted this ECG.   Date: 06/14/2015  EKG Time: 0100  Rate: 105  Rhythm: atrial fibrillation, rate 105  Axis: Normal  Intervals:none  ST&T Change: Nonspecific  ____________________________________________  RADIOLOGY  CT head without contrast interpreted per Dr. Cherly Hensenhang: 1. No acute intracranial pathology seen on CT. 2. Moderate cortical volume loss. 3. Chronic infarcts at the left parietal lobe, with associated encephalomalacia. 4. Small chronic lacunar infarct at the left basal ganglia.  Portable chest x-ray (viewed by me, interpreted per Dr. Cherly Hensenhang): Vascular congestion and borderline cardiomegaly. Mild bibasilar opacities may reflect mild interstitial edema. ____________________________________________   PROCEDURES  Procedure(s) performed: None  Critical Care performed: Yes, see critical care note(s)   CRITICAL CARE Performed by: Irean HongSUNG,JADE J   Total critical care time: 30 minutes  Critical care time was exclusive of separately billable procedures and treating other  patients.  Critical care was necessary to treat or prevent imminent or life-threatening deterioration.  Critical care was time spent personally by me on the following activities: development of treatment plan with patient and/or surrogate as well as nursing, discussions with consultants, evaluation of patient's response to treatment, examination of patient, obtaining history from patient or surrogate, ordering and performing treatments and interventions, ordering and review of laboratory studies, ordering and review of radiographic studies, pulse oximetry and re-evaluation of patient's condition.  ____________________________________________   INITIAL IMPRESSION / ASSESSMENT AND PLAN / ED COURSE  Pertinent labs & imaging results that were available during my care of the patient were reviewed by me and considered in my medical decision making (see chart for details).  65 year old male with a history of stroke on warfarin who presents with altered mental status. He is acutely agitated and combative requiring sedation in order to obtain CT head. Will obtain screening lab work including toxicological screen  ----------------------------------------- 2:22 AM on 06/14/2015 -----------------------------------------  Updated spouse of laboratory and imaging results. Will add CK given positive cocaine metabolites in urine drug screen. Discussed with hospitalist to evaluate in the emergency department for admission. Lactate noted; code sepsis was called. We will order antibiotic depending on chest x-ray and urinalysis results. ____________________________________________   FINAL CLINICAL IMPRESSION(S) / ED DIAGNOSES  Final diagnoses:  Sepsis, due to unspecified organism (HCC)  Fever Elevated troponin Altered mental status Cocaine use    NEW MEDICATIONS STARTED DURING THIS VISIT:  New Prescriptions   No medications on file     Note:  This document was prepared using Dragon voice  recognition software and may include unintentional dictation errors.    Irean HongJade J Sung, MD 06/14/15 [phone]

## 2015-06-14 NOTE — Progress Notes (Signed)
Patient has rested at times and has also been agitated in between. Family has been at bedside all day and is able to keep him somewhat calm. NT safety rounder also spent time with the patient keeping him calm. Heart rate has stayed in the 80's to 90's, afib. Given ativan twice and haldol once this shift. EKG will be checked again in the AM for QTc interval. Patient's breathing status has improved since this afternoon. Will continue to monitor.

## 2015-06-14 NOTE — Progress Notes (Addendum)
Dr. Loretha BrasilZeylikman rounded on patient just now. MD stated to try and give haldol instead of ativan. Order changed to 2.5mg . Ordered not to give if QTc over 500 by Dr. Loretha BrasilZeylikman. Patient is currently in afib/aflutter, rate is controlled, but unable to obtain a current QTc. EKG from ED at 1AM showed QTc of 527. Dr. Loretha BrasilZeylikman paged and stated not to give haldol, but okay to give ativan again now due to patient's current agitation. Patient is currently attempting to climb out of bed and is pulling at everything. MD ordered to run another EKG and stated he would be in touch with Dr. Allena KatzPatel. Patient may potentially need to go to step down on precedex. Ativan given now early and will continue to monitor. Respiratory called for EKG.

## 2015-06-14 NOTE — Consult Note (Signed)
CC: confusion.   HPI: Lee Gonzalez is an 65 y.o. male known history of cva,diabetes mellitus,hypertension on oral coumadin (not sure if compliant as subtherapeutic INR) brought in to to ER with confusion and change in mental status.  As per family this has been going on for about 5 days and worsening in the past day.  Pt stopped following commands and is severely agitated.  Febrile on admission but now last temp of 98.7.     Past Medical History  Diagnosis Date  . Stroke (HCC)   . Diabetes mellitus without complication (HCC)   . Hypertension     Past Surgical History  Procedure Laterality Date  . None      Family History  Problem Relation Age of Onset  . Diabetes Mellitus II Father   . Cancer Mother     Social History:  reports that he has never smoked. He does not have any smokeless tobacco history on file. He reports that he does not drink alcohol or use illicit drugs.  No Known Allergies  Medications: I have reviewed the patient's current medications.  ROS: Unable to obtain as not following commands.   Physical Examination: Blood pressure 146/98, pulse 62, temperature 98.7 F (37.1 C), temperature source Oral, resp. rate 22, height  (1.727 m), weight 144 lb 1.6 oz (65.363 kg), SpO2 98 %.  Pt is agitated, not following commands.  Symmetrically moves all his extremities  Pupils 4 mm and symmetrical Positive corneal reflex.    Laboratory Studies:   Basic Metabolic Panel:  Recent Labs Lab 06/14/15 0047 06/14/15 0523  NA 142 139  K 4.1 3.8  CL 103 102  CO2 27 26  GLUCOSE 178* 196*  BUN 33* 30*  CREATININE 1.57* 1.49*  CALCIUM 10.0 9.5    Liver Function Tests:  Recent Labs Lab 06/14/15 0047 06/14/15 0523  AST 23 20  ALT 15* 13*  ALKPHOS 74 66  BILITOT 0.5 0.5  PROT 8.4* 7.8  ALBUMIN 4.3 4.0   No results for input(s): LIPASE, AMYLASE in the last 168 hours. No results for input(s): AMMONIA in the last 168 hours.  CBC:  Recent  Labs Lab 06/14/15 0047 06/14/15 0523  WBC 8.5 6.8  NEUTROABS 6.1 5.8  HGB 14.6 14.0  HCT 45.8 43.7  MCV 92.3 91.3  PLT 199 162    Cardiac Enzymes:  Recent Labs Lab 06/14/15 0047  CKTOTAL 54  TROPONINI 0.09*    BNP: Invalid input(s): POCBNP  CBG:  Recent Labs Lab 06/14/15 0056  GLUCAP 177*    Microbiology: No results found for this or any previous visit.  Coagulation Studies:  Recent Labs  06/14/15 0047 06/14/15 0357 06/14/15 0523  LABPROT 17.0* 17.6* 17.5*  INR 1.37 1.44 1.43    Urinalysis:  Recent Labs Lab 06/14/15 0105  COLORURINE YELLOW*  LABSPEC 1.020  PHURINE 5.0  GLUCOSEU NEGATIVE  HGBUR NEGATIVE  BILIRUBINUR NEGATIVE  KETONESUR NEGATIVE  PROTEINUR 100*  NITRITE NEGATIVE  LEUKOCYTESUR TRACE*    Lipid Panel:  No results found for: CHOL, TRIG, HDL, CHOLHDL, VLDL, LDLCALC  HgbA1C: No results found for: HGBA1C  Urine Drug Screen:     Component Value Date/Time   LABOPIA NONE DETECTED 06/14/2015 0105   COCAINSCRNUR POSITIVE* 06/14/2015 0105   LABBENZ NONE DETECTED 06/14/2015 0105   AMPHETMU NONE DETECTED 06/14/2015 0105   THCU NONE DETECTED 06/14/2015 0105   LABBARB NONE DETECTED 06/14/2015 0105    Alcohol Level:  Recent Labs Lab 06/14/15 0047  ETH <5    Other results: EKG: afib rates 80s, qtc 527.  Imaging: Ct Head Wo Contrast  06/14/2015  CLINICAL DATA:  Acute onset of altered mental status. Initial encounter. EXAM: CT HEAD WITHOUT CONTRAST TECHNIQUE: Contiguous axial images were obtained from the base of the skull through the vertex without intravenous contrast. COMPARISON:  CT of the head performed 10/30/2009, and MRI of the brain performed 02/26/2013 FINDINGS: There is no evidence of acute infarction, mass lesion, or intra- or extra-axial hemorrhage on CT. Prominence of the ventricles and sulci reflects moderate cortical volume loss. Chronic infarcts are noted at the left parietal lobe, with associated encephalomalacia. A  small chronic lacunar infarct is noted at the left basal ganglia. Mild cerebellar atrophy is noted. The brainstem and fourth ventricle are within normal limits. No mass effect or midline shift is seen. There is no evidence of fracture; visualized osseous structures are unremarkable in appearance. The orbits are within normal limits. The paranasal sinuses and mastoid air cells are well-aerated. No significant soft tissue abnormalities are seen. IMPRESSION: 1. No acute intracranial pathology seen on CT. 2. Moderate cortical volume loss. 3. Chronic infarcts at the left parietal lobe, with associated encephalomalacia. 4. Small chronic lacunar infarct at the left basal ganglia. Electronically Signed   By: Roanna RaiderJeffery  Chang M.D.   On: 06/14/2015 01:59   Dg Chest Port 1 View  06/14/2015  CLINICAL DATA:  Acute onset of agitation and altered mental status. Initial encounter. EXAM: PORTABLE CHEST 1 VIEW COMPARISON:  None. FINDINGS: The lungs are well-aerated. Vascular congestion is noted. Mild bibasilar opacities may reflect mild interstitial edema. There is no evidence of pleural effusion or pneumothorax. The cardiomediastinal silhouette is borderline enlarged. No acute osseous abnormalities are seen. IMPRESSION: Vascular congestion and borderline cardiomegaly. Mild bibasilar opacities may reflect mild interstitial edema. Electronically Signed   By: Roanna RaiderJeffery  Chang M.D.   On: 06/14/2015 03:18     Assessment/Plan:  65 y.o. male known history of cva,diabetes mellitus,hypertension on oral coumadin (not sure if compliant as subtherapeutic INR) brought in to to ER with confusion and change in mental status.  As per family this has been going on for about 5 days and worsening in the past day.  Pt stopped following commands and is severely agitated.  Febrile on admission but now last temp of 98.7.    1. Agitation - Pt does not live with wife and according to her, he has his own apartment.  Not sure if taking home  medications - Positive for THC and cocaine. This could be withdrawal as unclear the frequency of use - currently on Haldol/ atvian.   - Last QTC is 527. Unable to use dopamine blockers at this point so Haldol held until qtc below 500.  - Seldom ativan - only one peripheral. If continues to be agitated would consider ICU transfer for precedex gtt and PICC line for ? Withdrawal - dehydration is a component of this - Not convinced this is meningitis as no neck stiffness and no fevers at this point.  No other clear source of infection. - elevated BP, ? PRESS (posterior reversible encephalopathy syndrome)  - EEG in AM - MRI brain ordered - LP tomorrow as well  2. A-fib/A-flutter - coumadin on medication list but not sure is compliant due to subtherapeutic INR.  - Currently rate controlled.  - would hold of anticoagulation today for LP tomorrow. After LP would consider heparin GTT - pt does have significant small vessel dz on CTH  as well as lacunar strokes.  - EKG repeat today for QTC    Significant time spent speaking to family.  D/w primary team and nursing Will follow   06/14/2015, 11:59 AM

## 2015-06-14 NOTE — Progress Notes (Signed)
Patient has been agitated this morning. Still only responding to pain. Patient looked visibly in pain and pulls away when he is touched. BP was >180 systolic due to agitation. Dr. Allena KatzPatel evaluated patient and stated to go ahead and give the 2mg  of ativan ordered. Given and patient is now resting and BP is down to 160's systolic. Wife and granddaughter are at bedside currently and state someone will be with him for most of the day. Patient still not alert enough to take PO medications. MD is aware.

## 2015-06-14 NOTE — Progress Notes (Signed)
Chest xray did not show any change from previous one on admission. ABG was okay. Patient's breathing has improved some now that he has calmed down a little. Still has periods of apnea. Patient appears to be having withdrawal symptoms. He is calling out constantly with incomprehensible speech, breathing faster, and is agitated when touched. When this was mentioned to patient's wife, she was offended and stated "He stays at his apartment sometimes, but he's mostly with me, and he does not do drugs every day. I don't want you mentioning that to anyone." Have not talked about cocaine use around any other family members but the wife, who is aware. Apologized to the wife and updated her on the ABG and chest xray. Will continue to closely monitor.

## 2015-06-15 ENCOUNTER — Inpatient Hospital Stay: Payer: Medicare (Managed Care)

## 2015-06-15 DIAGNOSIS — R4182 Altered mental status, unspecified: Secondary | ICD-10-CM

## 2015-06-15 LAB — PROTIME-INR
INR: 1.5
PROTHROMBIN TIME: 18.2 s — AB (ref 11.4–15.0)

## 2015-06-15 MED ORDER — CHLORHEXIDINE GLUCONATE 0.12 % MT SOLN
15.0000 mL | Freq: Two times a day (BID) | OROMUCOSAL | Status: DC
  Administered 2015-06-15 – 2015-06-17 (×4): 15 mL via OROMUCOSAL
  Filled 2015-06-15 (×4): qty 15

## 2015-06-15 MED ORDER — CETYLPYRIDINIUM CHLORIDE 0.05 % MT LIQD
7.0000 mL | Freq: Two times a day (BID) | OROMUCOSAL | Status: DC
  Administered 2015-06-15: 7 mL via OROMUCOSAL

## 2015-06-15 NOTE — Care Management (Signed)
Found that patient is followed by [facility].  Lee Gonzalez- a nurse from [facility] spoke with CM of attending's concern patient may needs skilled nursing.  Update Lee Sheldonshley on plans for MRI and physical therapy evaluation.  She confirms that patient and his wife live in separate apartments but she is very supportive.  It is reported that patient can be very difficult to live with at times and "pushes family members away."

## 2015-06-15 NOTE — Evaluation (Signed)
Clinical/Bedside Swallow Evaluation Patient Details  Name: Lee Gonzalez MRN: [id number] Date of Birth: [birth year]  Today's Date: 06/15/2015 Time: SLP Start Time (ACUTE ONLY): 1530 SLP Stop Time (ACUTE ONLY): 1630 SLP Time Calculation (min) (ACUTE ONLY): 60 min  Past Medical History:  Past Medical History  Diagnosis Date  . Stroke (HCC)   . Diabetes mellitus without complication (HCC)   . Hypertension    Past Surgical History:  Past Surgical History  Procedure Laterality Date  . None     HPI:      Assessment / Plan / Recommendation Clinical Impression  Pt appeared to adequately tolerate trials of thin liquids and purees w/ no overt s/s of aspiration noted; no oral phase deficits noted w/ these trials. Pt presented w/ increased drowsiness and required min. verbal/tactile cues intermittently to remain fully alert to po trials during session. Due to this and the fact that pt does not wear his dentures while eating, recommend a puree diet consistency at this time until more fully awake at his baseline; noted MRI results. Wife stated pt did not have any s/s of dysphagia at home. Pt at reduced risk for aspiration when fully awake/alert during po's and following general aspiration precautions; recommend meds in Puree w/ NSG. ST will f/u w/ toleration of diet and trials to upgrade tomorrow as appropriate. Wife and pt agreed. Of note, pt ehxibited min. discomfort w/ trials of ice water sec. to oral "nerve pain" resulting from previous CVAs, per Wife.     Aspiration Risk   (reduced w/ precautions and when fully awake)    Diet Recommendation  Dys. 1(puree), thin liquids; aspiration precautions; feeding assistance and monitoring. Must remain fully awake during any po's/meals.  Medication Administration: Whole meds with puree (crushed if necessary/able)    Other  Recommendations Recommended Consults:  (Dietician following) Oral Care Recommendations: Oral care BID;Staff/trained caregiver  to provide oral care   Follow up Recommendations  None (TBD)    Frequency and Duration min 2x/week  1 week       Prognosis Prognosis for Safe Diet Advancement: Good Barriers to Reach Goals:  (drowsy)      Swallow Study   General Date of Onset: 06/14/15 Type of Study: Bedside Swallow Evaluation Previous Swallow Assessment: none Diet Prior to this Study: Regular;Thin liquids (soft foods at home sec. to not wearing dentures) Temperature Spikes Noted: No (wbc 6.8) Respiratory Status: Room air History of Recent Intubation: No Behavior/Cognition: Alert;Cooperative;Pleasant mood;Lethargic/Drowsy (min. ) Oral Cavity Assessment: Dry Oral Care Completed by SLP: Yes Oral Cavity - Dentition: Missing dentition (takes dentures out to eat per Wife) Vision: Functional for self-feeding Self-Feeding Abilities: Able to feed self;Needs assist;Needs set up (assist while drowsy) Patient Positioning: Upright in bed Baseline Vocal Quality: Low vocal intensity;Normal Volitional Cough: Strong Volitional Swallow: Able to elicit    Oral/Motor/Sensory Function Overall Oral Motor/Sensory Function: Within functional limits   Ice Chips Ice chips: Within functional limits Presentation: Spoon (fed; 2 trials)   Thin Liquid Thin Liquid: Within functional limits Presentation: Cup;Self Fed (assisted by SLP; 8 trials)    Nectar Thick Nectar Thick Liquid: Not tested   Honey Thick Honey Thick Liquid: Not tested   Puree Puree: Within functional limits Presentation: Spoon (fed; 5 trials) Other Comments: declined further   Solid   GO   Solid: Not tested Other Comments: too drowsy       Jerilynn SomKatherine Clay Solum, MS, CCC-SLP  Riona Lahti 06/15/2015,4:40 PM

## 2015-06-15 NOTE — Progress Notes (Addendum)
Patient ID: Lee Gonzalez, male   DOB: 03-29-1950, 65 y.o.   MRN: [id number] [facility] - [place] at [facility]   PATIENT NAME: Lee Gonzalez    MR#:  [id number]  DATE OF BIRTH:  18-Jul-1950  SUBJECTIVE:   The patient was admitted for altered mental status. He is more awake today and knows his name only. REVIEW OF SYSTEMS:   Review of Systems  Unable to perform ROS: mental acuity    DRUG ALLERGIES:  No Known Allergies  VITALS:  Blood pressure 128/92, pulse 67, temperature 97.8 F (36.6 C), temperature source Oral, resp. rate 17, height  (1.727 m), weight 65.363 kg (144 lb 1.6 oz), SpO2 98 %.  PHYSICAL EXAMINATION:   Physical Exam  GENERAL:  65 y.o.-year-old patient lying in the bed with no acute distress.  EYES: Pupils equal, round, reactive to light and accommodation. No scleral icterus. Extraocular muscles intact.  HEENT: Head atraumatic, normocephalic. Oropharynx and nasopharynx clear.  NECK:  Supple, no jugular venous distention. No thyroid enlargement, no tenderness.  LUNGS: Normal breath sounds bilaterally, no wheezing, rales, rhonchi. No use of accessory muscles of respiration.  CARDIOVASCULAR: S1, S2 normal. No murmurs, rubs, or gallops.  ABDOMEN: Soft, nontender, nondistended. Bowel sounds present. No organomegaly or mass.  EXTREMITIES: No cyanosis, clubbing or edema b/l.    NEUROLOGIC: Cranial nerves II through XII are intact. Muscle strength 3-4/5 in all extremities. Sensation intact. Gait not checked.  PSYCHIATRIC:  patient is awake but confused. SKIN: No obvious rash, lesion, or ulcer.   LABORATORY PANEL:  CBC  Recent Labs Lab 06/14/15 0523  WBC 6.8  HGB 14.0  HCT 43.7  PLT 162    Chemistries   Recent Labs Lab 06/14/15 0523  NA 139  K 3.8  CL 102  CO2 26  GLUCOSE 196*  BUN 30*  CREATININE 1.49*  CALCIUM 9.5  AST 20  ALT 13*  ALKPHOS 66  BILITOT 0.5   Cardiac Enzymes  Recent Labs Lab  06/14/15 0047  TROPONINI 0.09*   RADIOLOGY:  Dg Chest 1 View  06/14/2015  CLINICAL DATA:  Shortness of breath. EXAM: CHEST 1 VIEW COMPARISON:  June 14, 2015. FINDINGS: Stable cardiomediastinal silhouette. No pneumothorax is noted. Right lung is clear. Mild left basilar opacity is noted concerning for atelectasis or infiltrate with possible mild associated pleural effusion. Bony thorax is unremarkable. IMPRESSION: Mild left basilar opacity is noted concerning for atelectasis or infiltrate with mild associated pleural effusion. Electronically Signed   By: Lupita Raider, M.D.   On: 06/14/2015 15:14   Ct Head Wo Contrast  06/14/2015  CLINICAL DATA:  Acute onset of altered mental status. Initial encounter. EXAM: CT HEAD WITHOUT CONTRAST TECHNIQUE: Contiguous axial images were obtained from the base of the skull through the vertex without intravenous contrast. COMPARISON:  CT of the head performed 10/30/2009, and MRI of the brain performed 02/26/2013 FINDINGS: There is no evidence of acute infarction, mass lesion, or intra- or extra-axial hemorrhage on CT. Prominence of the ventricles and sulci reflects moderate cortical volume loss. Chronic infarcts are noted at the left parietal lobe, with associated encephalomalacia. A small chronic lacunar infarct is noted at the left basal ganglia. Mild cerebellar atrophy is noted. The brainstem and fourth ventricle are within normal limits. No mass effect or midline shift is seen. There is no evidence of fracture; visualized osseous structures are unremarkable in appearance. The orbits are within normal limits. The paranasal sinuses and mastoid air cells  are well-aerated. No significant soft tissue abnormalities are seen. IMPRESSION: 1. No acute intracranial pathology seen on CT. 2. Moderate cortical volume loss. 3. Chronic infarcts at the left parietal lobe, with associated encephalomalacia. 4. Small chronic lacunar infarct at the left basal ganglia. Electronically  Signed   By: Roanna RaiderJeffery  Chang M.D.   On: 06/14/2015 01:59   Dg Chest Port 1 View  06/14/2015  CLINICAL DATA:  Acute onset of agitation and altered mental status. Initial encounter. EXAM: PORTABLE CHEST 1 VIEW COMPARISON:  None. FINDINGS: The lungs are well-aerated. Vascular congestion is noted. Mild bibasilar opacities may reflect mild interstitial edema. There is no evidence of pleural effusion or pneumothorax. The cardiomediastinal silhouette is borderline enlarged. No acute osseous abnormalities are seen. IMPRESSION: Vascular congestion and borderline cardiomegaly. Mild bibasilar opacities may reflect mild interstitial edema. Electronically Signed   By: Roanna RaiderJeffery  Chang M.D.   On: 06/14/2015 03:18   ASSESSMENT AND PLAN:   65 yr old male patient with history of cva,hypertension presented to ER with confusion and fever.  1.Sepsis,  unclear etiology. -Patient's chest x-ray negative. UA negative. -CT head shows old stroke -Neurology consultation for possible LP to rule out meningitis. No LP will be performed this time per Dr. Jodi Mourningeynold, neurologist. Continue Empiric Vanco and Zosyn, follow-up blood cultures. -Lactic acid improved   2.Altered mental status, acute metabolic encephalopath, possible due to sepsis and dehydration.  Improving Aspiration and fall precaution. Follow up MRI of the brain.  3.acute kidney injury with Dehydration -Continue IV hydration, follow-up BMP.  4.Mildly elevated troponin could be secondary to demand ischemia -No cardiac history  5.Hypertension. Resume home HTN medication. -When necessary hydralazine  6.H/O CVA Continue aspirin and Lipitor.  History of A. fib. Coumadin is on hold for lumbar puncture. Resume PTD.  Drug abuse. Toxicology showed cocaine positive.  Case discussed with Care Management/Social Worker. Management plans discussed with his wife and they are in agreement.  CODE STATUS: DO NOT RESUSCITATE (this [facility] who reviewed the notes and inform me about patient's CODE STATUS )   TOTAL TIME TAKING CARE OF THIS PATIENT: 39 minutes.  >50% time spent on counselling and coordination of care with  pt's wife. POSSIBLE D/C IN 2 DAYS, DEPENDING ON CLINICAL CONDITION.  Note: This dictation was prepared with Dragon dictation along with smaller phrase technology. Any transcriptional errors that result from this process are unintentional.  Shaune Pollackhen, Cheila Wickstrom M.D on 06/15/2015 at 1:18 PM  Between 7am to 6pm - Pager - [phone]  After 6pm go to [email or web address] - password [email or web address] [facility]  [facility]  Office  [phone]  CC: Primary care physician; No PCP Per Patient

## 2015-06-15 NOTE — Progress Notes (Signed)
Initial Nutrition Assessment  DOCUMENTATION CODES:   Not applicable  INTERVENTION:  -Await diet progression as medically able -Pt may benefit from addition of nutritional supplement once diet advanced  NUTRITION DIAGNOSIS:   Inadequate oral intake related to acute illness as evidenced by NPO status.  GOAL:   Patient will meet greater than or equal to 90% of their needs  MONITOR:   Diet advancement, Labs, Weight trends  REASON FOR ASSESSMENT:   Consult Assessment of nutrition requirement/status  ASSESSMENT:    65 yo male admitted with AMS, severe agitated and not following commands. Febrile on admission, sepsis with unknown etiology, AKI with dehydration.   Neurology following, CT head negative, MRI and LP pending. Pt not in room on visit today. Per MD notes, mental status improving but pt only knows his name at present. No po intake since admission as pt has been NPO   Past Medical History  Diagnosis Date  . Stroke (HCC)   . Diabetes mellitus without complication (HCC)   . Hypertension    Diet Order:  Diet NPO time specified  Skin:  Reviewed, no issues  Last BM:  06/13/15  Labs: reviewed  Meds: NS at 100 ml/hr, MVI, MagOx   Unable to complete Nutrition-Focused physical exam at this time.   Height:   Ht Readings from Last 1 Encounters:  06/14/15 5\' 8"  (1.727 m)    Weight:   Wt Readings from Last 1 Encounters:  06/14/15 144 lb 1.6 oz (65.363 kg)    BMI:  Body mass index is 21.92 kg/(m^2).  Estimated Nutritional Needs:   Kcal:  1950-2275 kcals   Protein:  72-91 g (1.1-1.4 g/kg)   Fluid:  >2 L per day  EDUCATION NEEDS:   Education needs no appropriate at this time  Romelle StarcherCate Selah Zelman MS, RD, LDN [phone] Pager  [phone] Weekend/On-Call Pager

## 2015-06-15 NOTE — Care Management Important Message (Signed)
Important Message  Patient Details  Name: Lee Gonzalez MRN: [id number] Date of Birth: 11-15-1950   Medicare Important Message Given:  Yes    Marily MemosLisa M Beola Vasallo, RN 06/15/2015, 12:44 PM

## 2015-06-15 NOTE — Progress Notes (Signed)
Patient agitated at times throughout the shift. APpears to be in pain. Notified Dr. Anne HahnWillis early in the shift and got an order for morphine. Medicated multiple times with apparent relief. Wife at side. Patient has been incontinent and still delirious, not following commands or speaking coherently.

## 2015-06-15 NOTE — Clinical Documentation Improvement (Signed)
Internal Medicine Please update your documentation within the medical record to reflect your response to this query. Thank you  Can the diagnosis of altered mental status be further specified?   Encephalopathy - Alcoholic, Anoxic/Hypoxia, Drug Induced/Toxic (specify drug), Hepatic, Hypertensive, Hypoglycemic, Metabolic/Septic, Traumatic/post concussive, Wernicke, Other  Other  Clinically Undetermined  Document any associated diagnoses/conditions.  Supporting Information: 06/14/15 progr note.Marland Kitchen.Marland Kitchen."Came in with Altered mental status..."..."NEUROLOGIC: Unable to assess due to agitation PSYCHIATRIC: patient is agitated and restless earlie"...r  Please exercise your independent, professional judgment when responding. A specific answer is not anticipated or expected.  Thank You,  Toribio Harbourphelia R Syble Picco, RN, BSN, CCDS Certified Clinical Documentation Specialist [place]: Health Information Management [phone]

## 2015-06-15 NOTE — Progress Notes (Signed)
Subjective: Patient improved today.  Still not back to baseline.    Objective: Current vital signs: BP 128/92 mmHg  Pulse 67  Temp(Src) 97.8 F (36.6 C) (Oral)  Resp 17  Ht 5\' 8"  (1.727 m)  Wt 65.363 kg (144 lb 1.6 oz)  BMI 21.92 kg/m2  SpO2 98% Vital signs in last 24 hours: Temp:  [97.8 F (36.6 C)] 97.8 F (36.6 C) (05/01 0347) Pulse Rate:  [60-71] 67 (05/01 0857) Resp:  [17-18] 17 (05/01 0857) BP: (128-165)/(73-98) 128/92 mmHg (05/01 0857) SpO2:  [98 %-100 %] 98 % (05/01 0857)  Intake/Output from previous day: 04/30 0701 - 05/01 0700 In: 3368.3 [I.V.:3018.3; IV Piggyback:350] Out: 0  Intake/Output this shift:   Nutritional status: Diet NPO time specified  Neurologic Exam: Mental Status: Lethargic.  Oriented to place but not year. Follows simple commands, better using left than right.  Speech fluent.   Cranial Nerves: II: Discs flat bilaterally; Seems to have difficulty with right visual field, pupils equal, round, reactive to light and accommodation III,IV, VI: ptosis not present, extra-ocular motions intact bilaterally V,VII: decrease in left NLF, facial light touch sensation normal bilaterally VIII: hearing normal bilaterally IX,X: gag reflex present XI: bilateral shoulder shrug XII: midline tongue extension Motor: Moves all extremities strongly against gravity but requires more prompting to use the right. Sensory: Pinprick and light touch intact throughout, bilaterally Deep Tendon Reflexes: 2+ and symmetric with absent AJ's bilaterally Plantars: Right: upgoing   Left: upgoing Cerebellar: Normal finger-to-nose testing bilaterally Gait: not tested due to safety concerns   Lab Results: Basic Metabolic Panel:  Recent Labs Lab 06/14/15 0047 06/14/15 0523  NA 142 139  K 4.1 3.8  CL 103 102  CO2 27 26  GLUCOSE 178* 196*  BUN 33* 30*  CREATININE 1.57* 1.49*  CALCIUM 10.0 9.5    Liver Function Tests:  Recent Labs Lab 06/14/15 0047 06/14/15 0523   AST 23 20  ALT 15* 13*  ALKPHOS 74 66  BILITOT 0.5 0.5  PROT 8.4* 7.8  ALBUMIN 4.3 4.0   No results for input(s): LIPASE, AMYLASE in the last 168 hours. No results for input(s): AMMONIA in the last 168 hours.  CBC:  Recent Labs Lab 06/14/15 0047 06/14/15 0523  WBC 8.5 6.8  NEUTROABS 6.1 5.8  HGB 14.6 14.0  HCT 45.8 43.7  MCV 92.3 91.3  PLT 199 162    Cardiac Enzymes:  Recent Labs Lab 06/14/15 0047  CKTOTAL 54  TROPONINI 0.09*    Lipid Panel: No results for input(s): CHOL, TRIG, HDL, CHOLHDL, VLDL, LDLCALC in the last 168 hours.  CBG:  Recent Labs Lab 06/14/15 0056  GLUCAP 177*    Microbiology: Results for orders placed or performed during the hospital encounter of 06/14/15  Blood Culture (routine x 2)     Status: None (Preliminary result)   Collection Time: 06/14/15  2:22 AM  Result Value Ref Range Status   Specimen Description BLOOD LEFT ASSIST CONTROL  Final   Special Requests BOTTLES DRAWN AEROBIC AND ANAEROBIC 5CCAERO,5CCANA  Final   Culture NO GROWTH < 12 HOURS  Final   Report Status PENDING  Incomplete  Blood Culture (routine x 2)     Status: None (Preliminary result)   Collection Time: 06/14/15  2:30 AM  Result Value Ref Range Status   Specimen Description BLOOD RIGHT HAND  Final   Special Requests BOTTLES DRAWN AEROBIC AND ANAEROBIC 5CCAERO,5CCANA  Final   Culture NO GROWTH < 12 HOURS  Final  Report Status PENDING  Incomplete    Coagulation Studies:  Recent Labs  06/14/15 0047 06/14/15 0357 06/14/15 0523 06/15/15 0430  LABPROT 17.0* 17.6* 17.5* 18.2*  INR 1.37 1.44 1.43 1.50    Imaging: Dg Chest 1 View  06/14/2015  CLINICAL DATA:  Shortness of breath. EXAM: CHEST 1 VIEW COMPARISON:  June 14, 2015. FINDINGS: Stable cardiomediastinal silhouette. No pneumothorax is noted. Right lung is clear. Mild left basilar opacity is noted concerning for atelectasis or infiltrate with possible mild associated pleural effusion. Bony thorax is  unremarkable. IMPRESSION: Mild left basilar opacity is noted concerning for atelectasis or infiltrate with mild associated pleural effusion. Electronically Signed   By: Lupita Raider, M.D.   On: 06/14/2015 15:14   Ct Head Wo Contrast  06/14/2015  CLINICAL DATA:  Acute onset of altered mental status. Initial encounter. EXAM: CT HEAD WITHOUT CONTRAST TECHNIQUE: Contiguous axial images were obtained from the base of the skull through the vertex without intravenous contrast. COMPARISON:  CT of the head performed 10/30/2009, and MRI of the brain performed 02/26/2013 FINDINGS: There is no evidence of acute infarction, mass lesion, or intra- or extra-axial hemorrhage on CT. Prominence of the ventricles and sulci reflects moderate cortical volume loss. Chronic infarcts are noted at the left parietal lobe, with associated encephalomalacia. A small chronic lacunar infarct is noted at the left basal ganglia. Mild cerebellar atrophy is noted. The brainstem and fourth ventricle are within normal limits. No mass effect or midline shift is seen. There is no evidence of fracture; visualized osseous structures are unremarkable in appearance. The orbits are within normal limits. The paranasal sinuses and mastoid air cells are well-aerated. No significant soft tissue abnormalities are seen. IMPRESSION: 1. No acute intracranial pathology seen on CT. 2. Moderate cortical volume loss. 3. Chronic infarcts at the left parietal lobe, with associated encephalomalacia. 4. Small chronic lacunar infarct at the left basal ganglia. Electronically Signed   By: Roanna Raider M.D.   On: 06/14/2015 01:59   Dg Chest Port 1 View  06/14/2015  CLINICAL DATA:  Acute onset of agitation and altered mental status. Initial encounter. EXAM: PORTABLE CHEST 1 VIEW COMPARISON:  None. FINDINGS: The lungs are well-aerated. Vascular congestion is noted. Mild bibasilar opacities may reflect mild interstitial edema. There is no evidence of pleural effusion or  pneumothorax. The cardiomediastinal silhouette is borderline enlarged. No acute osseous abnormalities are seen. IMPRESSION: Vascular congestion and borderline cardiomegaly. Mild bibasilar opacities may reflect mild interstitial edema. Electronically Signed   By: Roanna Raider M.D.   On: 06/14/2015 03:18    Medications:  Scheduled: . amLODipine  5 mg Oral Daily  . atorvastatin  40 mg Oral Daily  . carbamazepine  200 mg Oral TID  . citalopram  10 mg Oral Daily  . felodipine  10 mg Oral BID  . isosorbide dinitrate  40 mg Oral BID   And  . hydrALAZINE  75 mg Oral BID  . magnesium oxide  400 mg Oral Daily  . metoprolol succinate  150 mg Oral Daily  . multivitamin  1 tablet Oral Daily  . nortriptyline  20 mg Oral QHS  . piperacillin-tazobactam  3.375 g Intravenous Q8H  . sodium chloride flush  3 mL Intravenous Q12H  . timolol  1 drop Both Eyes QHS  . vancomycin  1,000 mg Intravenous Q18H  . warfarin  11.5 mg Oral q1800  . Warfarin - Pharmacist Dosing Inpatient   Does not apply q1800    Assessment/Plan: Patient  improved.  On Zosyn and Vancomycin.  To have LP today but PT elevated and with clinical improvement would not perform at this time.  Head CT personally reviewed and shows no acute changes.  Multiple chronic infarcts noted.    Recommendations: 1.  MRI brain without contrast   LOS: 1 day   Thana Farr, MD Neurology [phone] 06/15/2015  11:17 AM

## 2015-06-15 NOTE — Progress Notes (Signed)
ANTICOAGULATION CONSULT NOTE - Initial Consult  Pharmacy Consult warfarin Indication: history of CVA, a fib  No Known Allergies  Patient Measurements: Height: 5\' 8"  (172.7 cm) Weight: 144 lb 1.6 oz (65.363 kg) IBW/kg (Calculated) : 68.4 Heparin Dosing Weight:   Vital Signs: Temp: 97.8 F (36.6 C) (05/01 0347) Temp Source: Oral (05/01 0347) BP: 128/92 mmHg (05/01 0857) Pulse Rate: 67 (05/01 0857)  Labs:  Recent Labs  06/14/15 0047 06/14/15 0357 06/14/15 0523 06/15/15 0430  HGB 14.6  --  14.0  --   HCT 45.8  --  43.7  --   PLT 199  --  162  --   APTT  --   --  30  --   LABPROT 17.0* 17.6* 17.5* 18.2*  INR 1.37 1.44 1.43 1.50  CREATININE 1.57*  --  1.49*  --   CKTOTAL 54  --   --   --   TROPONINI 0.09*  --   --   --     Estimated Creatinine Clearance: 45.7 mL/min (by C-G formula based on Cr of 1.49).   Medical History: Past Medical History  Diagnosis Date  . Stroke (HCC)   . Diabetes mellitus without complication (HCC)   . Hypertension     Medications:  Infusions:  . sodium chloride 100 mL/hr at 06/15/15 1420    Assessment: 64 yom with PMH CVA on VKA, INR 1.37.  Patient prescribed warfarin 11.5mg  daily at home. Previously on hold for LP, will resume at this time.  Goal of Therapy:  INR 2-3 Monitor platelets by anticoagulation protocol: Yes   Plan:  Resume home dose warfarin 11.5 mg po daily. Pharmacy will monitor INR daily.  Garlon HatchetJody Jakeira Seeman, PharmD Clinical Pharmacist   06/15/2015,3:21 PM

## 2015-06-15 NOTE — Evaluation (Signed)
Physical Therapy Evaluation Patient Details Name: Lee Gonzalez MRN: [id number] DOB: 1950/08/20 Today's Date: 06/15/2015   History of Present Illness  Patient is a 65 y/o male that presents with AMS, he was found to test positive for illicit substances. 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   Clinical Impression  Patient noted to have decreased responsiveness and orientation at home and was brought to [facility]. Thus far imaging has not revealed any acute changes, patient more oriented today however still quite lethargic (eyes half open throughout). He is able to follow simple commands, but does not make any attempts at stepping forward when prompted. Patient also noted to have increasing physical assistance needs as he is typically able to ambulate without assistance. No sensory deficits identified, nor any focal motor deficits though this was limited given his cognitive status. He appears to require SNF at this time to increase his independence with mobility.     Follow Up Recommendations SNF    Equipment Recommendations       Recommendations for Other Services       Precautions / Restrictions Precautions Precautions: Fall Restrictions Weight Bearing Restrictions: No      Mobility  Bed Mobility Overal bed mobility: Needs Assistance Bed Mobility: Supine to Sit;Sit to Supine     Supine to sit: Min assist Sit to supine: Min guard   General bed mobility comments: Patient is rather slow with transfer, requires some assistance to manage LEs.   Transfers Overall transfer level: Needs assistance Equipment used: Rolling walker (2 wheeled) Transfers: Sit to/from Stand Sit to Stand: Min assist         General transfer comment: Patient performed slow transfer with cuing for hand placement. He required 2 attempts and physical assist on second attempt.   Ambulation/Gait             General Gait Details:  Asked patient to ambulate, however he marched in place even with physical pull provided to RW.   Stairs            Wheelchair Mobility    Modified Rankin (Stroke Patients Only)       Balance Overall balance assessment: Needs assistance Sitting-balance support: Bilateral upper extremity supported Sitting balance-Leahy Scale: Fair   Postural control: Posterior lean Standing balance support: Bilateral upper extremity supported Standing balance-Leahy Scale: Fair                               Pertinent Vitals/Pain Pain Assessment: No/denies pain    Home Living Family/patient expects to be discharged to:: Private residence Living Arrangements: Alone Available Help at Discharge: Family;Available PRN/intermittently Type of Home: Apartment Home Access:  (Curb)     Home Layout: One level        Prior Function Level of Independence: Independent         Comments: Patient has been independent with mobility at baseline.      Hand Dominance        Extremity/Trunk Assessment   Upper Extremity Assessment: Overall WFL for tasks assessed           Lower Extremity Assessment: Overall WFL for tasks assessed         Communication   Communication: Expressive difficulties (Appears slow in speech, but responds appropriately. )  Cognition Arousal/Alertness: Lethargic Behavior During Therapy: WFL for tasks assessed/performed Overall Cognitive Status: Impaired/Different from baseline  Area of Impairment: Orientation;Following commands;Safety/judgement;Awareness       Following Commands: Follows one step commands inconsistently       General Comments: Unclear of deficits as he is slow in speech and rather lethargic.     General Comments General comments (skin integrity, edema, etc.): Sensation WNL through testing.    Exercises        Assessment/Plan    PT Assessment Patient needs continued PT services  PT Diagnosis Difficulty  walking;Generalized weakness   PT Problem List Decreased strength;Decreased cognition;Decreased activity tolerance;Decreased balance;Decreased knowledge of use of DME;Decreased safety awareness;Decreased mobility  PT Treatment Interventions DME instruction;Gait training;Therapeutic activities;Therapeutic exercise;Stair training;Balance training   PT Goals (Current goals can be found in the Care Plan section) Acute Rehab PT Goals Patient Stated Goal: To get stronger before going home  PT Goal Formulation: With family Time For Goal Achievement: 06/29/15 Potential to Achieve Goals: Good    Frequency Min 2X/week   Barriers to discharge Decreased caregiver support Patient lives alone with wife nearby     Co-evaluation               End of Session Equipment Utilized During Treatment: Gait belt Activity Tolerance: Patient tolerated treatment well Patient left: in bed;with bed alarm set;with call bell/phone within reach;with family/visitor present Nurse Communication: Mobility status         Time: 1740-1800 PT Time Calculation (min) (ACUTE ONLY): 20 min   Charges:   PT Evaluation $PT Eval Moderate Complexity: 1 Procedure     PT G Codes:       Kerin RansomPatrick A McNamara, PT, DPT    06/15/2015, 6:31 PM

## 2015-06-16 DIAGNOSIS — G9341 Metabolic encephalopathy: Secondary | ICD-10-CM

## 2015-06-16 LAB — BASIC METABOLIC PANEL
Anion gap: 8 (ref 5–15)
BUN: 17 mg/dL (ref 6–20)
CALCIUM: 8.9 mg/dL (ref 8.9–10.3)
CHLORIDE: 107 mmol/L (ref 101–111)
CO2: 25 mmol/L (ref 22–32)
CREATININE: 1.2 mg/dL (ref 0.61–1.24)
GFR calc non Af Amer: >60 mL/min (ref >60)
GLUCOSE: 192 mg/dL — AB (ref 65–99)
Potassium: 4.2 mmol/L (ref 3.5–5.1)
Sodium: 140 mmol/L (ref 135–145)

## 2015-06-16 LAB — URINE CULTURE: SPECIAL REQUESTS: NORMAL

## 2015-06-16 LAB — CARBAMAZEPINE LEVEL, TOTAL: Carbamazepine Lvl: 3.8 ug/mL — ABNORMAL LOW (ref 4.0–12.0)

## 2015-06-16 LAB — PROTIME-INR
INR: 1.92
PROTHROMBIN TIME: 21.9 s — AB (ref 11.4–15.0)

## 2015-06-16 MED ORDER — WARFARIN SODIUM 5 MG PO TABS
7.5000 mg | ORAL_TABLET | Freq: Every day | ORAL | Status: DC
  Administered 2015-06-16 – 2015-06-17 (×2): 7.5 mg via ORAL
  Filled 2015-06-16 (×2): qty 2

## 2015-06-16 NOTE — Progress Notes (Signed)
Physical Therapy Treatment Patient Details Name: Lee Gonzalez MRN: [id number] DOB: [birth year] Today's Date: 06/16/2015    History of Present Illness Patient is a 65 y/o male that presents with AMS, he was found to test positive for illicit substances. 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     PT Comments    Pt received sitting on edge of bed with speech therapist.  Pt was able to return to supine and sit on other side of bed with increased time and use of rails and verbal cues.  Pt was able to ambulate today 40' x 1 then 60' x 1.  Gait quality progressed during session but he continues with very short step length with decreased step height.  Verbal cues given to increase length and height with only minor corrections and quickly reverts back to gait pattern once cues are removed.  No loss of balance noted but he remains unsafe to ambulate without assistance.  Pt with decreased visual scanning at times and needed verbal cues to avoid objects 50% of the times.      Follow Up Recommendations  SNF     Equipment Recommendations       Recommendations for Other Services       Precautions / Restrictions Precautions Precautions: Fall Restrictions Weight Bearing Restrictions: No    Mobility  Bed Mobility Overal bed mobility: Modified Independent (rails) Bed Mobility: Supine to Sit;Sit to Supine     Supine to sit: Min guard Sit to supine: Min guard   General bed mobility comments: improved.  increased time needed with vc's but no physical assist needed.  used rails  Transfers Overall transfer level: Needs assistance Equipment used: Rolling walker (2 wheeled) Transfers: Sit to/from Stand Sit to Stand: Min guard         General transfer comment: slow with poor hand placements and safety.  Ambulation/Gait Ambulation/Gait assistance: Min assist Ambulation Distance (Feet): 50 Feet (slow but without  lob) Assistive device: Rolling walker (2 wheeled) Gait Pattern/deviations: Step-through pattern;Decreased stride length;Shuffle;Decreased step length - right;Decreased step length - left Gait velocity: decreased Gait velocity interpretation: <1.8 ft/sec, indicative of risk for recurrent falls General Gait Details: Very slow gait with short steps, dec step height.  minimal correction with verbal and visual cues.   Stairs            Wheelchair Mobility    Modified Rankin (Stroke Patients Only)       Balance Overall balance assessment: Needs assistance Sitting-balance support: Feet supported Sitting balance-Leahy Scale: Fair     Standing balance support: Bilateral upper extremity supported Standing balance-Leahy Scale: Fair                      Cognition Arousal/Alertness: Awake/alert Behavior During Therapy: WFL for tasks assessed/performed Overall Cognitive Status: Impaired/Different from baseline Area of Impairment: Following commands;Safety/judgement                    Exercises      General Comments        Pertinent Vitals/Pain Pain Assessment: No/denies pain    Home Living                      Prior Function            PT Goals (current goals can now be found in the care plan section) Acute Rehab PT Goals Patient  Stated Goal: To get stronger before going home  Progress towards PT goals: Progressing toward goals    Frequency  Min 2X/week    PT Plan Current plan remains appropriate    Co-evaluation             End of Session Equipment Utilized During Treatment: Gait belt Activity Tolerance: Patient tolerated treatment well Patient left: in bed;with call bell/phone within reach;with bed alarm set;with family/visitor present     Time: [id number] PT Time Calculation (min) (ACUTE ONLY): 24 min  Charges:  $Gait Training: 8-22 mins $Therapeutic Activity: 8-22 mins                    G Codes:      Danielle DessSarah Mikaili Flippin,  PTA 06/16/2015, 10:28 AM

## 2015-06-16 NOTE — Clinical Social Work Note (Signed)
PT has recommended STR. No CSW consult placed however due to patient being a [facility], CSW contacted [facility] to inform them of the recommendation and to see what they were willing to cover. [facility] and their provider spoke with the family and they have decided for patient to return to his wife's home and for him to come to their center 5 days a week for rehab. RN CM has been updated. York SpanielMonica Markham Dumlao MSW,LCSW [phone]

## 2015-06-16 NOTE — Progress Notes (Signed)
ANTICOAGULATION CONSULT NOTE - Initial Consult  Pharmacy Consult warfarin Indication: history of CVA, a fib  Allergies  Allergen Reactions  . Percocet [Oxycodone-Acetaminophen] Other (See Comments)    Reaction: unknown (mild)    Patient Measurements: Height: 5\' 8"  (172.7 cm) Weight: 144 lb 1.6 oz (65.363 kg) IBW/kg (Calculated) : 68.4  Vital Signs: Temp: 97.4 F (36.3 C) (05/02 0539) Temp Source: Oral (05/02 0539) BP: 159/85 mmHg (05/02 0539) Pulse Rate: 68 (05/02 0539)  Labs:  Recent Labs  06/14/15 0047  06/14/15 0523 06/15/15 0430 06/16/15 0513  HGB 14.6  --  14.0  --   --   HCT 45.8  --  43.7  --   --   PLT 199  --  162  --   --   APTT  --   --  30  --   --   LABPROT 17.0*  < > 17.5* 18.2* 21.9*  INR 1.37  < > 1.43 1.50 1.92  CREATININE 1.57*  --  1.49*  --  1.20  CKTOTAL 54  --   --   --   --   TROPONINI 0.09*  --   --   --   --   < > = values in this interval not displayed.  Estimated Creatinine Clearance: 56.8 mL/min (by C-G formula based on Cr of 1.2).   Medical History: Past Medical History  Diagnosis Date  . Stroke (HCC)   . Diabetes mellitus without complication (HCC)   . Hypertension     Medications:  Infusions:  . sodium chloride 100 mL/hr at 06/16/15 0531    Assessment: 64 yom with PMH CVA on VKA, INR 1.37.  Patient prescribed warfarin 11.5mg  daily at home. Previously on hold for LP, resumed 5/1  Question of compliance with warfarin PTA, INR subtherapeutic on admission. [facility] patient "sporadic" about taking warfarin and INR checks.   4/30 - INR: 1.43, coumadin held 5/01 - INR: 1.50, coumadin 11.5mg  given 5/02 - INR 1.92  Goal of Therapy:  INR 2-3 Monitor platelets by anticoagulation protocol: Yes   Plan:  Given home dose of 11.5mg  yesterday, will reduce to 7.5mg  for now and monitor due to concomitant antibiotics and question as to if patient is actually taking this dose of 11.5mg  at home. Recheck INR with AM  labs.  Garlon HatchetJody Alyona Romack, PharmD Clinical Pharmacist   06/16/2015,11:05 AM

## 2015-06-16 NOTE — Progress Notes (Signed)
Speech Language Pathology Treatment: Dysphagia  Patient Details Name: Lee Gonzalez MRN: [id number] DOB: 05-17-1950 Today's Date: 06/16/2015 Time: 1000-1040 SLP Time Calculation (min) (ACUTE ONLY): 40 min  Assessment / Plan / Recommendation Clinical Impression  Pt appeared to adequate tolerate trials of thin lqiuids and Dys. 3(soft solids) w/ no overt s/s of aspiration noted; no decline in respiratory status or wet vocal quality noted during/post po trials. Noted min. increased mastication time/effort; suspect d/t lack of dentition. Using small, single bites and verbal cues for full mastication effort w/ soft solids w/ general aspiration precautions, pt appeared to adequately tolerate trials of soft solid foods w/ appropriate oral clearing; SLP monitored oral clearing as well and suggested alternating foods/liquids to aid any clearing. Due to pt's current presentation including recent drowsiness and Cognitive decline, as well as lack of dentition when eating meals, recommend a Dys. 3 w/ thin liquids w/ aspiration precautions; meds in puree; monitoring at all meals in order to reduce risk for aspiration.    HPI        SLP Plan  Continue with current plan of care     Recommendations  Diet recommendations: Dysphagia 3 (mechanical soft);Thin liquid Liquids provided via: Cup;Straw Medication Administration: Whole meds with puree (as necessary) Supervision: Patient able to self feed;Intermittent supervision to cue for compensatory strategies Compensations: Minimize environmental distractions;Slow rate;Small sips/bites;Follow solids with liquid Postural Changes and/or Swallow Maneuvers: Seated upright 90 degrees             Oral Care Recommendations: Oral care BID;Staff/trained caregiver to provide oral care Follow up Recommendations: None Plan: Continue with current plan of care     GO               Jerilynn SomKatherine Watson, MS, CCC-SLP  Watson,Katherine 06/16/2015, 3:31 PM

## 2015-06-16 NOTE — Progress Notes (Signed)
Patient ID: Lee Gonzalez, male   DOB: [birth year], 65 y.o.   MRN: [id number] [facility] [place] [facility]   PATIENT NAME: Lee PolkaFrank Pickup    MR#:  [id number]  DATE OF BIRTH:  [birth year]  SUBJECTIVE:   The patient was admitted for altered mental status. He is more awake today and knows his name and year but not location. He got PT this am. REVIEW OF SYSTEMS:   Review of Systems  Unable to perform ROS: mental acuity    DRUG ALLERGIES:   Allergies  Allergen Reactions  . Percocet [Oxycodone-Acetaminophen] Other (See Comments)    Reaction: unknown (mild)    VITALS:  Blood pressure 125/79, pulse 72, temperature 98 F (36.7 C), temperature source Oral, resp. rate 18, height 5\' 8"  (1.727 m), weight 65.363 kg (144 lb 1.6 oz), SpO2 99 %.  PHYSICAL EXAMINATION:   Physical Exam  GENERAL:  65 y.o.-year-old patient lying in the bed with no acute distress.  EYES: Pupils equal, round, reactive to light and accommodation. No scleral icterus. Extraocular muscles intact.  HEENT: Head atraumatic, normocephalic. Oropharynx and nasopharynx clear.  NECK:  Supple, no jugular venous distention. No thyroid enlargement, no tenderness.  LUNGS: Normal breath sounds bilaterally, no wheezing, rales, rhonchi. No use of accessory muscles of respiration.  CARDIOVASCULAR: S1, S2 normal. No murmurs, rubs, or gallops.  ABDOMEN: Soft, nontender, nondistended. Bowel sounds present. No organomegaly or mass.  EXTREMITIES: No cyanosis, clubbing or edema b/l.    NEUROLOGIC: Cranial nerves II through XII are intact. Muscle strength 3-4/5 in all extremities. Sensation intact. Gait not checked.  PSYCHIATRIC:  patient is awake but confused. SKIN: No obvious rash, lesion, or ulcer.   LABORATORY PANEL:  CBC  Recent Labs Lab 06/14/15 0523  WBC 6.8  HGB 14.0  HCT 43.7  PLT 162    Chemistries   Recent Labs Lab 06/14/15 0523 06/16/15 0513  NA 139 140  K 3.8 4.2  CL  102 107  CO2 26 25  GLUCOSE 196* 192*  BUN 30* 17  CREATININE 1.49* 1.20  CALCIUM 9.5 8.9  AST 20  --   ALT 13*  --   ALKPHOS 66  --   BILITOT 0.5  --    Cardiac Enzymes  Recent Labs Lab 06/14/15 0047  TROPONINI 0.09*   RADIOLOGY:  Dg Chest 1 View  06/14/2015  CLINICAL DATA:  Shortness of breath. EXAM: CHEST 1 VIEW COMPARISON:  June 14, 2015. FINDINGS: Stable cardiomediastinal silhouette. No pneumothorax is noted. Right lung is clear. Mild left basilar opacity is noted concerning for atelectasis or infiltrate with possible mild associated pleural effusion. Bony thorax is unremarkable. IMPRESSION: Mild left basilar opacity is noted concerning for atelectasis or infiltrate with mild associated pleural effusion. Electronically Signed   By: Lupita RaiderJames  Green Jr, M.D.   On: 06/14/2015 15:14   Mr Brain Wo Contrast  06/15/2015  CLINICAL DATA:  Confusion and mental status change since yesterday. Agitation. Disorientation. Sepsis. Diabetes and hypertension. Anticoagulation with Coumadin. EXAM: MRI HEAD WITHOUT CONTRAST TECHNIQUE: Multiplanar, multiecho pulse sequences of the brain and surrounding structures were obtained without intravenous contrast. COMPARISON:  CT head 06/14/2015.  MR head 09/02/2013. FINDINGS: The patient was unable to remain motionless for the exam. Small or subtle lesions could be overlooked. No evidence for acute infarction, hemorrhage, mass lesion, hydrocephalus, or extra-axial fluid. Global atrophy. Moderate chronic microvascular ischemic change. Chronic LEFT frontal and LEFT parietal infarcts with encephalomalacia and gliosis. Small RIGHT frontal infarct  which was acute in 2015. No midline shift. Chronic deep white matter lacunar infarcts, most notably in the LEFT thalamus and LEFT basal ganglia. Flow voids are maintained throughout the carotid, basilar, and vertebral arteries. There are no areas of chronic hemorrhage. Pituitary, pineal, and cerebellar tonsils unremarkable. No  upper cervical lesions. Large incidental occipital scalp lipoma, eccentric to the RIGHT. No sinus or mastoid disease. IMPRESSION: Atrophy and small vessel disease.  No acute intracranial findings. Electronically Signed   By: Elsie Stain M.D.   On: 06/15/2015 14:28   ASSESSMENT AND PLAN:   65 yr old male patient with history of cva,hypertension presented to ER with confusion and fever.  1.Sepsis,  unclear etiology. -Patient's chest x-ray negative. UA negative. -CT head shows old stroke -Neurology consultation for possible LP to rule out meningitis. No LP will be performed this time per Dr. Jodi Mourning, neurologist. He has been treated with Empiric Vanco and Zosyn, Negative blood cultures so far. -Lactic acid improved No evidence of source of infection. Discontinue antibiotics.  2.Altered mental status, acute metabolic encephalopath, possible due to sepsis and dehydration.  Improving Aspiration and fall precaution. MRI of the brain did not show any new infarction.  3.acute kidney injury with Dehydration Improved with IV hydration.  4.Mildly elevated troponin could be secondary to demand ischemia -No cardiac history  5.Hypertension. Resume home HTN medication. -When necessary hydralazine  6.H/O CVA Continue aspirin and Lipitor.  History of A. fib. Coumadin is on hold for lumbar puncture. Resumed PTD.  Drug abuse. Toxicology showed cocaine positive.   PT consult suggested skilled nursing facility placement. Case discussed with Care Management/Social Worker. Management plans discussed with his wife and she is in agreement.  CODE STATUS: DO NOT RESUSCITATE (this was discussed with [place] [facility] on-call physician who reviewed the notes and inform me about patient's CODE STATUS )   TOTAL TIME TAKING CARE OF THIS PATIENT: 37 minutes.  >50% time spent on counselling and coordination of care with  pt's wife. POSSIBLE D/C IN 2 DAYS, DEPENDING ON CLINICAL CONDITION.  Note: This  dictation was prepared with Dragon dictation along with smaller phrase technology. Any transcriptional errors that result from this process are unintentional.  Shaune Pollack M.D on 06/16/2015 at 12:23 PM  Between 7am to 6pm - Pager - [phone]  After 6pm go to [email or web address] - password [email or web address] [facility]  [facility]  Office  [phone]  CC: Primary care physician; No PCP Per Patient

## 2015-06-16 NOTE — Progress Notes (Signed)
Subjective: Patient continues to improve.  Wife reports that he is about 50% better.  Still not walking as he was before and unable to remember everything.    Objective: Current vital signs: BP 159/85 mmHg  Pulse 68  Temp(Src) 97.4 F (36.3 C) (Oral)  Resp 18  Ht 5\' 8"  (1.727 m)  Wt 65.363 kg (144 lb 1.6 oz)  BMI 21.92 kg/m2  SpO2 99% Vital signs in last 24 hours: Temp:  [97.4 F (36.3 C)-97.6 F (36.4 C)] 97.4 F (36.3 C) (05/02 0539) Pulse Rate:  [68-81] 68 (05/02 0539) Resp:  [18] 18 (05/02 0539) BP: (123-159)/(73-86) 159/85 mmHg (05/02 0539) SpO2:  [95 %-99 %] 99 % (05/02 0539)  Intake/Output from previous day: 05/01 0701 - 05/02 0700 In: 2993.7 [I.V.:2693.7; IV Piggyback:300] Out: 125 [Urine:125] Intake/Output this shift: Total I/O In: 240 [P.O.:240] Out: 1000 [Urine:1000] Nutritional status: DIET - DYS 1 Room service appropriate?: Yes with Assist; Fluid consistency:: Thin  Neurologic Exam: Mental Status: Alert. Oriented to place and year but not month.  Does know the season. Follows simple commands, better using left than right. Speech fluent.  Cranial Nerves: II: Discs flat bilaterally; Seems to have difficulty with right visual field but no field cuts noted, pupils equal, round, reactive to light and accommodation III,IV, VI: ptosis not present, extra-ocular motions intact bilaterally V,VII: decrease in left NLF, facial light touch sensation normal bilaterally VIII: hearing normal bilaterally IX,X: gag reflex present XI: bilateral shoulder shrug XII: midline tongue extension Motor: Moves all extremities strongly against gravity but requires more prompting to use the right.    Lab Results: Basic Metabolic Panel:  Recent Labs Lab 06/14/15 0047 06/14/15 0523 06/16/15 0513  NA 142 139 140  K 4.1 3.8 4.2  CL 103 102 107  CO2 27 26 25   GLUCOSE 178* 196* 192*  BUN 33* 30* 17  CREATININE 1.57* 1.49* 1.20  CALCIUM 10.0 9.5 8.9    Liver Function  Tests:  Recent Labs Lab 06/14/15 0047 06/14/15 0523  AST 23 20  ALT 15* 13*  ALKPHOS 74 66  BILITOT 0.5 0.5  PROT 8.4* 7.8  ALBUMIN 4.3 4.0   No results for input(s): LIPASE, AMYLASE in the last 168 hours. No results for input(s): AMMONIA in the last 168 hours.  CBC:  Recent Labs Lab 06/14/15 0047 06/14/15 0523  WBC 8.5 6.8  NEUTROABS 6.1 5.8  HGB 14.6 14.0  HCT 45.8 43.7  MCV 92.3 91.3  PLT 199 162    Cardiac Enzymes:  Recent Labs Lab 06/14/15 0047  CKTOTAL 54  TROPONINI 0.09*    Lipid Panel: No results for input(s): CHOL, TRIG, HDL, CHOLHDL, VLDL, LDLCALC in the last 168 hours.  CBG:  Recent Labs Lab 06/14/15 0056  GLUCAP 177*    Microbiology: Results for orders placed or performed during the hospital encounter of 06/14/15  Urine culture     Status: None (Preliminary result)   Collection Time: 06/14/15  1:05 AM  Result Value Ref Range Status   Specimen Description URINE, RANDOM  Final   Special Requests Normal  Final   Culture INSIGNIFICANT GROWTH  Final   Report Status PENDING  Incomplete  Blood Culture (routine x 2)     Status: None (Preliminary result)   Collection Time: 06/14/15  2:22 AM  Result Value Ref Range Status   Specimen Description BLOOD LEFT ASSIST CONTROL  Final   Special Requests BOTTLES DRAWN AEROBIC AND ANAEROBIC 5CCAERO,5CCANA  Final   Culture NO GROWTH 2  DAYS  Final   Report Status PENDING  Incomplete  Blood Culture (routine x 2)     Status: None (Preliminary result)   Collection Time: 06/14/15  2:30 AM  Result Value Ref Range Status   Specimen Description BLOOD RIGHT HAND  Final   Special Requests BOTTLES DRAWN AEROBIC AND ANAEROBIC 5CCAERO,5CCANA  Final   Culture NO GROWTH 2 DAYS  Final   Report Status PENDING  Incomplete    Coagulation Studies:  Recent Labs  06/14/15 0047 06/14/15 0357 06/14/15 0523 06/15/15 0430 06/16/15 0513  LABPROT 17.0* 17.6* 17.5* 18.2* 21.9*  INR 1.37 1.44 1.43 1.50 1.92     Imaging: Dg Chest 1 View  06/14/2015  CLINICAL DATA:  Shortness of breath. EXAM: CHEST 1 VIEW COMPARISON:  June 14, 2015. FINDINGS: Stable cardiomediastinal silhouette. No pneumothorax is noted. Right lung is clear. Mild left basilar opacity is noted concerning for atelectasis or infiltrate with possible mild associated pleural effusion. Bony thorax is unremarkable. IMPRESSION: Mild left basilar opacity is noted concerning for atelectasis or infiltrate with mild associated pleural effusion. Electronically Signed   By: Lupita RaiderJames  Green Jr, M.D.   On: 06/14/2015 15:14   Mr Brain Wo Contrast  06/15/2015  CLINICAL DATA:  Confusion and mental status change since yesterday. Agitation. Disorientation. Sepsis. Diabetes and hypertension. Anticoagulation with Coumadin. EXAM: MRI HEAD WITHOUT CONTRAST TECHNIQUE: Multiplanar, multiecho pulse sequences of the brain and surrounding structures were obtained without intravenous contrast. COMPARISON:  CT head 06/14/2015.  MR head 09/02/2013. FINDINGS: The patient was unable to remain motionless for the exam. Small or subtle lesions could be overlooked. No evidence for acute infarction, hemorrhage, mass lesion, hydrocephalus, or extra-axial fluid. Global atrophy. Moderate chronic microvascular ischemic change. Chronic LEFT frontal and LEFT parietal infarcts with encephalomalacia and gliosis. Small RIGHT frontal infarct which was acute in 2015. No midline shift. Chronic deep white matter lacunar infarcts, most notably in the LEFT thalamus and LEFT basal ganglia. Flow voids are maintained throughout the carotid, basilar, and vertebral arteries. There are no areas of chronic hemorrhage. Pituitary, pineal, and cerebellar tonsils unremarkable. No upper cervical lesions. Large incidental occipital scalp lipoma, eccentric to the RIGHT. No sinus or mastoid disease. IMPRESSION: Atrophy and small vessel disease.  No acute intracranial findings. Electronically Signed   By: Elsie StainJohn T Curnes  M.D.   On: 06/15/2015 14:28    Medications:  I have reviewed the patient's current medications. Scheduled: . amLODipine  5 mg Oral Daily  . antiseptic oral rinse  7 mL Mouth Rinse q12n4p  . atorvastatin  40 mg Oral Daily  . carbamazepine  200 mg Oral TID  . chlorhexidine  15 mL Mouth Rinse BID  . citalopram  10 mg Oral Daily  . felodipine  10 mg Oral BID  . isosorbide dinitrate  40 mg Oral BID   And  . hydrALAZINE  75 mg Oral BID  . magnesium oxide  400 mg Oral Daily  . metoprolol succinate  150 mg Oral Daily  . multivitamin  1 tablet Oral Daily  . nortriptyline  20 mg Oral QHS  . piperacillin-tazobactam  3.375 g Intravenous Q8H  . sodium chloride flush  3 mL Intravenous Q12H  . timolol  1 drop Both Eyes QHS  . vancomycin  1,000 mg Intravenous Q18H  . warfarin  11.5 mg Oral q1800  . Warfarin - Pharmacist Dosing Inpatient   Does not apply q1800    Assessment/Plan: Patient improving daily.  MRI of the brain personally reviewed and shows  no acute changes.  Chronic left sided infarcts noted which may explain the mild deficits noted on the right that may be more apparent now that he is ill.  Presentation may have been related to drug use/infection.  Would continue to defer LP.  May restart Coumadin.  May bridge with heparin if necessary.    Recommendations: 1.  Tegretol level   LOS: 2 days   Thana Farr, MD Neurology [phone] 06/16/2015  10:21 AM

## 2015-06-16 NOTE — Progress Notes (Signed)
D/C telemetry.  Patient oriented to person and place.  No complaints of pain.  Able to ambulate with PT today to the nurses station.  IVF infusing with no complications.  Meds crushed in applesauce.

## 2015-06-17 LAB — PROTIME-INR
INR: 2.15
Prothrombin Time: 23.8 seconds — ABNORMAL HIGH (ref 11.4–15.0)

## 2015-06-17 MED ORDER — METOPROLOL SUCCINATE ER 100 MG PO TB24
100.0000 mg | ORAL_TABLET | Freq: Every day | ORAL | Status: DC
  Administered 2015-06-18: 100 mg via ORAL
  Filled 2015-06-17: qty 1

## 2015-06-17 MED ORDER — AMLODIPINE BESYLATE 10 MG PO TABS
10.0000 mg | ORAL_TABLET | Freq: Every day | ORAL | Status: DC
  Administered 2015-06-18: 10 mg via ORAL
  Filled 2015-06-17: qty 1

## 2015-06-17 NOTE — Progress Notes (Signed)
ANTICOAGULATION CONSULT NOTE - Initial Consult  Pharmacy Consult warfarin Indication: history of CVA, a fib  Allergies  Allergen Reactions  . Percocet [Oxycodone-Acetaminophen] Other (See Comments)    Reaction: unknown (mild)    Patient Measurements: Height: 5\' 8"  (172.7 cm) Weight: 144 lb 1.6 oz (65.363 kg) IBW/kg (Calculated) : 68.4  Vital Signs: Temp: 97.5 F (36.4 C) (05/03 0435) Temp Source: Oral (05/03 0435) BP: 169/109 mmHg (05/03 1028) Pulse Rate: 66 (05/03 1036)  Labs:  Recent Labs  06/15/15 0430 06/16/15 0513 06/17/15 0444  LABPROT 18.2* 21.9* 23.8*  INR 1.50 1.92 2.15  CREATININE  --  1.20  --     Estimated Creatinine Clearance: 56.8 mL/min (by C-G formula based on Cr of 1.2).   Medical History: Past Medical History  Diagnosis Date  . Stroke (HCC)   . Diabetes mellitus without complication (HCC)   . Hypertension     Medications:  Infusions:     Assessment: [age] yom with PMH CVA on VKA, INR 1.37.  Patient prescribed warfarin 11.5mg  daily at home. Previously on hold for LP, resumed 5/1  Question of compliance with warfarin PTA, INR subtherapeutic on admission. [facility] patient "sporadic" about taking warfarin and INR checks.   4/30 - INR: 1.43, coumadin held 5/01 - INR: 1.50, coumadin 11.5mg  given 5/02 - INR 1.92, 7.5mg  5/03 - INR: 2.15  Goal of Therapy:  INR 2-3 Monitor platelets by anticoagulation protocol: Yes   Plan:  Given home dose of 11.5mg  on 5/3, reduced to 7.5mg  for now and monitor due to concomitant antibiotics and question as to if patient is actually taking this dose of 11.5mg  at home. Recheck INR with AM labs.  Garlon HatchetJody Ronny Ruddell, PharmD Clinical Pharmacist   06/17/2015,11:54 AM

## 2015-06-17 NOTE — Care Management Important Message (Signed)
Important Message  Patient Details  Name: Girtha HakeFrank Lewis Zietlow MRN: [id number] Date of Birth: 05-Feb-1951   Medicare Important Message Given:  Yes    Olegario MessierKathy A Kristan Votta 06/17/2015, 2:16 PM

## 2015-06-17 NOTE — Discharge Instructions (Signed)
Information on my medicine - Coumadin   (Warfarin)  This medication education was reviewed with me or my healthcare representative as part of my discharge preparation.  The pharmacist that spoke with me during my hospital stay was:  Martyn MalayBarefoot,Makale Pindell C, [facility]  Why was Coumadin prescribed for you? Coumadin was prescribed for you because you have a blood clot or a medical condition that can cause an increased risk of forming blood clots. Blood clots can cause serious health problems by blocking the flow of blood to the heart, lung, or brain. Coumadin can prevent harmful blood clots from forming. As a reminder your indication for Coumadin is:   Stroke Prevention Because Of Atrial Fibrillation  What test will check on my response to Coumadin? While on Coumadin (warfarin) you will need to have an INR test regularly to ensure that your dose is keeping you in the desired range. The INR (international normalized ratio) number is calculated from the result of the laboratory test called prothrombin time (PT).  If an INR APPOINTMENT HAS NOT ALREADY BEEN MADE FOR YOU please schedule an appointment to have this lab work done by your health care provider within 7 days. Your INR goal is usually a number between:  2 to 3 or your provider may give you a more narrow range like 2-2.5.  Ask your health care provider during an office visit what your goal INR is.  What  do you need to  know  About  COUMADIN? Take Coumadin (warfarin) exactly as prescribed by your healthcare provider about the same time each day.  DO NOT stop taking without talking to the doctor who prescribed the medication.  Stopping without other blood clot prevention medication to take the place of Coumadin may increase your risk of developing a new clot or stroke.  Get refills before you run out.  What do you do if you miss a dose? If you miss a dose, take it as soon as you remember on the same day then continue your regularly scheduled regimen the next  day.  Do not take two doses of Coumadin at the same time.  Important Safety Information A possible side effect of Coumadin (Warfarin) is an increased risk of bleeding. You should call your healthcare provider right away if you experience any of the following: ? Bleeding from an injury or your nose that does not stop. ? Unusual colored urine (red or dark brown) or unusual colored stools (red or black). ? Unusual bruising for unknown reasons. ? A serious fall or if you hit your head (even if there is no bleeding).  Some foods or medicines interact with Coumadin (warfarin) and might alter your response to warfarin. To help avoid this: ? Eat a balanced diet, maintaining a consistent amount of Vitamin K. ? Notify your provider about major diet changes you plan to make. ? Avoid alcohol or limit your intake to 1 drink for women and 2 drinks for men per day. (1 drink is 5 oz. wine, 12 oz. beer, or 1.5 oz. liquor.)  Make sure that ANY health care provider who prescribes medication for you knows that you are taking Coumadin (warfarin).  Also make sure the healthcare provider who is monitoring your Coumadin knows when you have started a new medication including herbals and non-prescription products.  Coumadin (Warfarin)  Major Drug Interactions  Increased Warfarin Effect Decreased Warfarin Effect  Alcohol (large quantities) Antibiotics (esp. Septra/Bactrim, Flagyl, Cipro) Amiodarone (Cordarone) Aspirin (ASA) Cimetidine (Tagamet) Megestrol (Megace) NSAIDs (ibuprofen, naproxen,  etc.) °Piroxicam (Feldene) °Propafenone (Rythmol SR) °Propranolol (Inderal) °Isoniazid (INH) °Posaconazole (Noxafil) Barbiturates (Phenobarbital) °Carbamazepine (Tegretol) °Chlordiazepoxide (Librium) °Cholestyramine (Questran) °Griseofulvin °Oral Contraceptives °Rifampin °Sucralfate (Carafate) °Vitamin K  ° °Coumadin® (Warfarin) Major Herbal Interactions  °Increased Warfarin Effect Decreased Warfarin Effect   °Garlic °Ginseng °Ginkgo biloba Coenzyme Q10 °Green tea °St. John’s wort   ° °Coumadin® (Warfarin) FOOD Interactions  °Eat a consistent number of servings per week of foods HIGH in Vitamin K °(1 serving = ½ cup)  °Collards (cooked, or boiled & drained) °Kale (cooked, or boiled & drained) °Mustard greens (cooked, or boiled & drained) °Parsley *serving size only = ¼ cup °Spinach (cooked, or boiled & drained) °Swiss chard (cooked, or boiled & drained) °Turnip greens (cooked, or boiled & drained)  °Eat a consistent number of servings per week of foods MEDIUM-HIGH in Vitamin K °(1 serving = 1 cup)  °Asparagus (cooked, or boiled & drained) °Broccoli (cooked, boiled & drained, or raw & chopped) °Brussel sprouts (cooked, or boiled & drained) *serving size only = ½ cup °Lettuce, raw (green leaf, endive, romaine) °Spinach, raw °Turnip greens, raw & chopped  ° °These websites have more information on Coumadin (warfarin):  www.coumadin.com; °www.ahrq.gov/consumer/coumadin.htm; ° ° ° °

## 2015-06-17 NOTE — Progress Notes (Signed)
Patient ID: Lee Gonzalez, male   DOB: 1950-11-04, 65 y.o.   MRN: [id number] [facility] [place] [facility]   PATIENT NAME: Lee Gonzalez    MR#:  [id number]  DATE OF BIRTH:  1950-11-04  SUBJECTIVE:   The patient was admitted for altered mental status. And he is awake alert oriented.  Wife  Reports some confusion on and off. REVIEW OF SYSTEMS:   Review of Systems  Alert awake oriented.  Cardiovascular: No chest pain, no shortness of breath.  Pulmonary no shortness of breath, no cough.  GI patient has no nausea, no vomiting, diarrhea. Tolerating the diet. Neurological ; no headache. DRUG ALLERGIES:   Allergies  Allergen Reactions  . Percocet [Oxycodone-Acetaminophen] Other (See Comments)    Reaction: unknown (mild)    VITALS:  Blood pressure 169/109, pulse 66, temperature 97.5 F (36.4 C), temperature source Oral, resp. rate 19, height 5\' 8"  (1.727 m), weight 65.363 kg (144 lb 1.6 oz), SpO2 100 %.  PHYSICAL EXAMINATION:   Physical Exam  GENERAL:  65 y.o.-year-old patient lying in the bed with no acute distress.  EYES: Pupils equal, round, reactive to light and accommodation. No scleral icterus. Extraocular muscles intact.  HEENT: Head atraumatic, normocephalic. Oropharynx and nasopharynx clear.  NECK:  Supple, no jugular venous distention. No thyroid enlargement, no tenderness.  LUNGS: Normal breath sounds bilaterally, no wheezing, rales, rhonchi. No use of accessory muscles of respiration.  CARDIOVASCULAR: S1, S2 normal. No murmurs, rubs, or gallops.  ABDOMEN: Soft, nontender, nondistended. Bowel sounds present. No organomegaly or mass.  EXTREMITIES: No cyanosis, clubbing or edema b/l.    NEUROLOGIC: Cranial nerves II through XII are intact. Muscle strength 3-4/5 in all extremities. Sensation intact. Gait not checked.  PSYCHIATRIC:  patient is awake ,alert,oriented,but slow to respond,.. SKIN: No obvious rash, lesion, or ulcer.    LABORATORY PANEL:  CBC  Recent Labs Lab 06/14/15 0523  WBC 6.8  HGB 14.0  HCT 43.7  PLT 162    Chemistries   Recent Labs Lab 06/14/15 0523 06/16/15 0513  NA 139 140  K 3.8 4.2  CL 102 107  CO2 26 25  GLUCOSE 196* 192*  BUN 30* 17  CREATININE 1.49* 1.20  CALCIUM 9.5 8.9  AST 20  --   ALT 13*  --   ALKPHOS 66  --   BILITOT 0.5  --    Cardiac Enzymes  Recent Labs Lab 06/14/15 0047  TROPONINI 0.09*   RADIOLOGY:  Mr Brain Wo Contrast  06/15/2015  CLINICAL DATA:  Confusion and mental status change since yesterday. Agitation. Disorientation. Sepsis. Diabetes and hypertension. Anticoagulation with Coumadin. EXAM: MRI HEAD WITHOUT CONTRAST TECHNIQUE: Multiplanar, multiecho pulse sequences of the brain and surrounding structures were obtained without intravenous contrast. COMPARISON:  CT head 06/14/2015.  MR head 09/02/2013. FINDINGS: The patient was unable to remain motionless for the exam. Small or subtle lesions could be overlooked. No evidence for acute infarction, hemorrhage, mass lesion, hydrocephalus, or extra-axial fluid. Global atrophy. Moderate chronic microvascular ischemic change. Chronic LEFT frontal and LEFT parietal infarcts with encephalomalacia and gliosis. Small RIGHT frontal infarct which was acute in 2015. No midline shift. Chronic deep white matter lacunar infarcts, most notably in the LEFT thalamus and LEFT basal ganglia. Flow voids are maintained throughout the carotid, basilar, and vertebral arteries. There are no areas of chronic hemorrhage. Pituitary, pineal, and cerebellar tonsils unremarkable. No upper cervical lesions. Large incidental occipital scalp lipoma, eccentric to the RIGHT. No sinus or  mastoid disease. IMPRESSION: Atrophy and small vessel disease.  No acute intracranial findings. Electronically Signed   By: Elsie Stain M.D.   On: 06/15/2015 14:28   ASSESSMENT AND PLAN:   65 yr old male patient with history of cva,hypertension presented to  ER with confusion and fever.  1.Sepsis,  unclear etiology. -Patient's chest x-ray negative. UA negative. -CT head shows old stroke - no Indication for lumbar puncture. He has been treated with Empiric Vanco and Zosyn, Negative blood cultures so far. -Lactic acid improved No evidence of source of infection. Discontinued antibiotics.  2.Altered mental status, acute metabolic encephalopath, possible due to sepsis and dehydration.  Improving Aspiration and fall precaution. MRI of the brain did not show any new infarction.  3.acute kidney injury with Dehydration Improved with IV hydration. Decrease IV fluids.   4.Mildly elevated troponin could be secondary to demand ischemia -No cardiac history  5.Hypertension. Resume home HTN medication. -When necessary hydralazine  6.H/O CVA Continue aspirin and Lipitor.  History of A. fib. Coumadin is on hold for lumbar puncture. Resumed PTD.  Drug abuse. Toxicology showed cocaine positive.   PT consult suggested skilled nursing facility placement.but wife refused and wants to take him and attend  [facility],likley d/c am Case discussed with Care Management/Social Worker. Management plans discussed with his wife and she is in agreement.  CODE STATUS: DO NOT RESUSCITATE (this was discussed with [place] healthcare on-call physician who reviewed the notes and inform me about patient's CODE STATUS )   TOTAL TIME TAKING CARE OF THIS PATIENT: 20 minutes.   POSSIBLE D/C IN 2 DAYS, DEPENDING ON CLINICAL CONDITION.  Note: This dictation was prepared with Dragon dictation along with smaller phrase technology. Any transcriptional errors that result from this process are unintentional.  Kavion Mancinas M.D on 06/17/2015 at 11:37 AM  Between 7am to 6pm - Pager - [phone]  After 6pm go to [email or web address] - password [email or web address]  [facility]  Office  [phone]  CC: Primary care physician; No PCP Per Patient

## 2015-06-17 NOTE — Progress Notes (Signed)
Subjective: Patient continues to have some confusion but improved.  Has walked with therapy using a RW.  Objective: Current vital signs: BP 138/91 mmHg  Pulse 74  Temp(Src) 97.9 F (36.6 C) (Oral)  Resp 18  Ht  (1.727 m)  Wt 65.363 kg (144 lb 1.6 oz)  BMI 21.92 kg/m2  SpO2 97% Vital signs in last 24 hours: Temp:  [97.5 F (36.4 C)-97.9 F (36.6 C)] 97.9 F (36.6 C) (05/03 1154) Pulse Rate:  [52-74] 74 (05/03 1154) Resp:  [18-20] 18 (05/03 1154) BP: (138-173)/(87-109) 138/91 mmHg (05/03 1154) SpO2:  [97 %-100 %] 97 % (05/03 1154)  Intake/Output from previous day: 05/02 0701 - 05/03 0700 In: 1520 [P.O.:720; I.V.:800] Out: 2600 [Urine:2600] Intake/Output this shift: Total I/O In: 570 [P.O.:120; I.V.:450] Out: 0  Nutritional status: DIET DYS 3 Room service appropriate?: Yes with Assist; Fluid consistency:: Thin  Neurologic Exam: Mental Status: Alert. Oriented to place and year but not month. Follows simple commands. Speech fluent.  Cranial Nerves: II: Discs flat bilaterally; Seems to have difficulty with right visual field but no field cuts noted, pupils equal, round, reactive to light and accommodation III,IV, VI: ptosis not present, extra-ocular motions intact bilaterally V,VII: decrease in left NLF, facial light touch sensation normal bilaterally VIII: hearing normal bilaterally IX,X: gag reflex present XI: bilateral shoulder shrug XII: midline tongue extension Motor: Moves all extremities strongly against gravity.   Lab Results: Basic Metabolic Panel:  Recent Labs Lab 06/14/15 0047 06/14/15 0523 06/16/15 0513  NA 142 139 140  K 4.1 3.8 4.2  CL 103 102 107  CO2 GLUCOSE 178* 196* 192*  BUN 33* 30* 17  CREATININE 1.57* 1.49* 1.20  CALCIUM 10.0 9.5 8.9    Liver Function Tests:  Recent Labs Lab 06/14/15 0047 06/14/15 0523  AST 23 20  ALT 15* 13*  ALKPHOS 74 66  BILITOT 0.5 0.5  PROT 8.4* 7.8  ALBUMIN 4.3 4.0   No results  for input(s): LIPASE, AMYLASE in the last 168 hours. No results for input(s): AMMONIA in the last 168 hours.  CBC:  Recent Labs Lab 06/14/15 0047 06/14/15 0523  WBC 8.5 6.8  NEUTROABS 6.1 5.8  HGB 14.6 14.0  HCT 45.8 43.7  MCV 92.3 91.3  PLT 199 162    Cardiac Enzymes:  Recent Labs Lab 06/14/15 0047  CKTOTAL 54  TROPONINI 0.09*    Lipid Panel: No results for input(s): CHOL, TRIG, HDL, CHOLHDL, VLDL, LDLCALC in the last 168 hours.  CBG:  Recent Labs Lab 06/14/15 0056  GLUCAP 177*    Microbiology: Results for orders placed or performed during the hospital encounter of 06/14/15  Urine culture     Status: Abnormal   Collection Time: 06/14/15  1:05 AM  Result Value Ref Range Status   Specimen Description URINE, RANDOM  Final   Special Requests Normal  Final   Culture MULTIPLE SPECIES PRESENT, SUGGEST RECOLLECTION (A)  Final   Report Status 06/16/2015 FINAL  Final  Blood Culture (routine x 2)     Status: None (Preliminary result)   Collection Time: 06/14/15  2:22 AM  Result Value Ref Range Status   Specimen Description BLOOD LEFT ASSIST CONTROL  Final   Special Requests BOTTLES DRAWN AEROBIC AND ANAEROBIC 5CCAERO,5CCANA  Final   Culture NO GROWTH 3 DAYS  Final   Report Status PENDING  Incomplete  Blood Culture (routine x 2)     Status: None (Preliminary result)   Collection Time: 06/14/15  2:30 AM  Result Value Ref Range Status   Specimen Description BLOOD RIGHT HAND  Final   Special Requests BOTTLES DRAWN AEROBIC AND ANAEROBIC 5CCAERO,5CCANA  Final   Culture NO GROWTH 3 DAYS  Final   Report Status PENDING  Incomplete    Coagulation Studies:  Recent Labs  06/15/15 0430 06/16/15 0513 06/17/15 0444  LABPROT 18.2* 21.9* 23.8*  INR 1.50 1.92 2.15    Imaging: Mr Brain Wo Contrast  06/15/2015  CLINICAL DATA:  Confusion and mental status change since yesterday. Agitation. Disorientation. Sepsis. Diabetes and hypertension. Anticoagulation with Coumadin.  EXAM: MRI HEAD WITHOUT CONTRAST TECHNIQUE: Multiplanar, multiecho pulse sequences of the brain and surrounding structures were obtained without intravenous contrast. COMPARISON:  CT head 06/14/2015.  MR head 09/02/2013. FINDINGS: The patient was unable to remain motionless for the exam. Small or subtle lesions could be overlooked. No evidence for acute infarction, hemorrhage, mass lesion, hydrocephalus, or extra-axial fluid. Global atrophy. Moderate chronic microvascular ischemic change. Chronic LEFT frontal and LEFT parietal infarcts with encephalomalacia and gliosis. Small RIGHT frontal infarct which was acute in 2015. No midline shift. Chronic deep white matter lacunar infarcts, most notably in the LEFT thalamus and LEFT basal ganglia. Flow voids are maintained throughout the carotid, basilar, and vertebral arteries. There are no areas of chronic hemorrhage. Pituitary, pineal, and cerebellar tonsils unremarkable. No upper cervical lesions. Large incidental occipital scalp lipoma, eccentric to the RIGHT. No sinus or mastoid disease. IMPRESSION: Atrophy and small vessel disease.  No acute intracranial findings. Electronically Signed   By: Elsie StainJohn T Curnes M.D.   On: 06/15/2015 14:28    Medications:  I have reviewed the patient's current medications. Scheduled: . [START ON 06/18/2015] amLODipine  10 mg Oral Daily  . antiseptic oral rinse  7 mL Mouth Rinse q12n4p  . atorvastatin  40 mg Oral Daily  . carbamazepine  200 mg Oral TID  . chlorhexidine  15 mL Mouth Rinse BID  . citalopram  10 mg Oral Daily  . felodipine  10 mg Oral BID  . isosorbide dinitrate  40 mg Oral BID   And  . hydrALAZINE  75 mg Oral BID  . magnesium oxide  400 mg Oral Daily  . [START ON 06/18/2015] metoprolol succinate  100 mg Oral Daily  . multivitamin  1 tablet Oral Daily  . nortriptyline  20 mg Oral QHS  . sodium chloride flush  3 mL Intravenous Q12H  . timolol  1 drop Both Eyes QHS  . warfarin  7.5 mg Oral q1800  . Warfarin -  Pharmacist Dosing Inpatient   Does not apply q1800    Assessment/Plan: Patient slowly improving.  Does complain of facial pain.  Now back on Tegretol.  Level subtherapeutic at 3.8.  Pain may be improved by adjustment in dose but would not do this while mental status and patient not closer to baseline.  May be done on an outpatient basis.     LOS: 3 days   Thana FarrLeslie Makhiya Coburn, MD Neurology [phone] 06/17/2015  12:26 PM

## 2015-06-17 NOTE — Progress Notes (Signed)
PT Cancellation Note  Patient Details Name: Lee HakeFrank Lewis Gonzalez MRN: [id number] DOB: [birth year]   Cancelled Treatment:    Reason Eval/Treat Not Completed: Patient declined, no reason specified. Treatment attempted; sister in the room. Despite strong encouragement with benefits of participating with PT, pt continues to refuse all PT treatment today, stating "I just don't want to." Pt looks tired, but denies any other complaints. Re attempt treatment tomorrow.    Kristeen MissHeidi Elizabeth Bishop, [place] 06/17/2015, 1:57 PM

## 2015-06-17 NOTE — Progress Notes (Signed)
Speech Language Pathology Treatment: Dysphagia  Patient Details Name: Lee Gonzalez MRN: [id number] DOB: 1950-09-02 Today's Date: 06/17/2015 Time: 9324-1991 SLP Time Calculation (min) (ACUTE ONLY): 45 min  Assessment / Plan / Recommendation Clinical Impression  Pt appears to be tolerating his current diet consistency of Dys. 3 (mech soft) w/ thin liquids w/ no reported overt s/s of aspiration by staff, or by pt. Pt consumed po trials w/ no significant oral phase deficits noted except min. increased mastication time/effort sec. to edentulous status, and helped to feed self w/ setup assistance. Pt's alertness is improving and pt is more engaged in conversation w/ others; behavior is improved and he requires less cueing. Pt appears to be at his baseline w/ toleration of an oral diet per presentation and pt/family report. Rec. Continue w/ soft solids and meds in puree as needed; general aspiration precautions. No further skilled ST services indicated at this time. NSG to reconsult if any change in status.   HPI  Pt is a 65 y.o. male with a known history of cva,diabetes mellitus,hypertension on oral coumadin as outpatient presented to ER with confusion and change in mental status. Per MD note, Drug abuse w/ Toxicology report showing cocaine positive. This has been going on since yesterday.Patient is disoriented to time,place and person.Responds to painful commands.He was given iv ativan and haldol in ER for agitation.Not much history could be obtained from the patient.He was worked with CT head which showed old cva but no new intracranial abnormalities.In the ER patient was febrile and appeared dehydrated.According to family members he is not drinking any fluids and oral intake is poor too.      SLP Plan  All goals met     Recommendations  Diet recommendations: Dysphagia 3 (mechanical soft);Thin liquid Liquids provided via: Cup;Straw Medication Administration: Whole meds with puree (as  needed) Supervision: Patient able to self feed (setup assistance; support ) Compensations: Minimize environmental distractions;Slow rate;Small sips/bites;Follow solids with liquid Postural Changes and/or Swallow Maneuvers: Seated upright 90 degrees;Upright 30-60 min after meal             Oral Care Recommendations: Oral care BID;Staff/trained caregiver to provide oral care Follow up Recommendations: None Plan: All goals met     GO                Watson,Katherine 06/17/2015, 3:46 PM

## 2015-06-17 NOTE — Care Management (Signed)
Spoke with wife to confirm discharge plan. She is in agreement with to take patient home with [facility]. They do not want SNF. Plan is home in 1-2 days.

## 2015-06-18 LAB — CBC
HEMATOCRIT: 42.3 % (ref 40.0–52.0)
HEMOGLOBIN: 13.8 g/dL (ref 13.0–18.0)
MCH: 29.2 pg (ref 26.0–34.0)
MCHC: 32.5 g/dL (ref 32.0–36.0)
MCV: 89.9 fL (ref 80.0–100.0)
Platelets: 173 10*3/uL (ref 150–440)
RBC: 4.7 MIL/uL (ref 4.40–5.90)
RDW: 15.2 % — AB (ref 11.5–14.5)
WBC: 3.7 10*3/uL — ABNORMAL LOW (ref 3.8–10.6)

## 2015-06-18 LAB — PROTIME-INR
INR: 2.04
Prothrombin Time: 22.9 seconds — ABNORMAL HIGH (ref 11.4–15.0)

## 2015-06-18 MED ORDER — AMLODIPINE BESYLATE 10 MG PO TABS: 10.0000 mg | ORAL_TABLET | Freq: Every day | ORAL | Status: DC

## 2015-06-18 NOTE — Care Management (Signed)
[facility] aware of discharge.

## 2015-06-18 NOTE — Progress Notes (Signed)
Discharge instructions explained to pt and pts wife/ verbalized an understanding/ iv removed/ DNR form returned to pt/ will transport off unit via wheelchair.

## 2015-06-18 NOTE — Progress Notes (Signed)
Patient ID: Lee Gonzalez, male   DOB: 1950-04-22, 65 y.o.   MRN: [id number] [facility] [place] [facility]   PATIENT NAME: Lee Gonzalez    MR#:  [id number]  DATE OF BIRTH:  10-30-1950  SUBJECTIVE:   The patient was admitted for altered mental status. And he is awake alert oriented. Stable for discharge. REVIEW OF SYSTEMS:   Review of Systems  Alert awake oriented.  Cardiovascular: No chest pain, no shortness of breath.  Pulmonary no shortness of breath, no cough.  GI patient has no nausea, no vomiting, diarrhea. Tolerating the diet. Neurological ; no headache. DRUG ALLERGIES:   Allergies  Allergen Reactions  . Percocet [Oxycodone-Acetaminophen] Other (See Comments)    Reaction: unknown (mild)    VITALS:  Blood pressure 137/97, pulse 67, temperature 97.8 F (36.6 C), temperature source Oral, resp. rate 19, height  (1.727 m), weight 65.363 kg (144 lb 1.6 oz), SpO2 98 %.  PHYSICAL EXAMINATION:   Physical Exam  GENERAL:  65 y.o.-year-old patient lying in the bed with no acute distress.  EYES: Pupils equal, round, reactive to light and accommodation. No scleral icterus. Extraocular muscles intact.  HEENT: Head atraumatic, normocephalic. Oropharynx and nasopharynx clear.  NECK:  Supple, no jugular venous distention. No thyroid enlargement, no tenderness.  LUNGS: Normal breath sounds bilaterally, no wheezing, rales, rhonchi. No use of accessory muscles of respiration.  CARDIOVASCULAR: S1, S2 normal. No murmurs, rubs, or gallops.  ABDOMEN: Soft, nontender, nondistended. Bowel sounds present. No organomegaly or mass.  EXTREMITIES: No cyanosis, clubbing or edema b/l.    NEUROLOGIC: Cranial nerves II through XII are intact. Muscle strength 3-4/5 in all extremities. Sensation intact. Gait not checked.  PSYCHIATRIC:  patient is awake ,alert,oriented,but slow to respond,.. SKIN: No obvious rash, lesion, or ulcer.   LABORATORY PANEL:   CBC  Recent Labs Lab 06/18/15 0441  WBC 3.7*  HGB 13.8  HCT 42.3  PLT 173    Chemistries   Recent Labs Lab 06/14/15 0523 06/16/15 0513  NA 139 140  K 3.8 4.2  CL 102 107  CO2 26 25  GLUCOSE 196* 192*  BUN 30* 17  CREATININE 1.49* 1.20  CALCIUM 9.5 8.9  AST 20  --   ALT 13*  --   ALKPHOS 66  --   BILITOT 0.5  --    Cardiac Enzymes  Recent Labs Lab 06/14/15 0047  TROPONINI 0.09*   RADIOLOGY:  No results found. ASSESSMENT AND PLAN:   65 yr old male patient with history of cva,hypertension presented to ER with confusion and fever.  1.Sepsis,  unclear etiology. -Patient's chest x-ray negative. UA negative. -CT head shows old stroke - no Indication for lumbar puncture. He has been treated with Empiric Vanco and Zosyn, Negative blood cultures so far. -Lactic acid improved No evidence of source of infection. Discontinued antibiotics.  2.Altered mental status, acute metabolic encephalopath, possible due to sepsis and dehydration.  Improving Aspiration and fall precaution. MRI of the brain did not show any new infarction.  3.acute kidney injury with Dehydration Improved with IV hydration.  4.Mildly elevated troponin could be secondary to demand ischemia -No cardiac history  5.Hypertension. Resume home HTN medication. -  6.H/O CVA Continue aspirin and Lipitor.  History of A. fib. Resume coumadin Drug abuse. Toxicology showed cocaine positive.   PT consult suggested skilled nursing facility placement.but wife refused and wants to take him and attend  [facility] rehab Discharge home today. Case discussed  with Care Management/Social Worker. Management plans discussed with his wife and she is in agreement.  CODE STATUS: DO NOT RESUSCITATE ([facility] who reviewed the notes and inform me about patient's CODE STATUS )   TOTAL TIME TAKING CARE OF THIS PATIENT: 66 minutes.   POSSIBLE D/C IN 66 DAYS,  DEPENDING ON CLINICAL CONDITION.  Note: This dictation was prepared with Dragon dictation along with smaller phrase technology. Any transcriptional errors that result from this process are unintentional.  Aymen Widrig M.D on 06/18/2015 at 5:26 PM  Between 7am to 6pm - Pager - [phone]  After 6pm go to [email or web address] - password [email or web address] [facility]  [facility]  Office  [phone]  CC: Primary care physician; No PCP Per Patient

## 2015-06-19 LAB — CULTURE, BLOOD (ROUTINE X 2): CULTURE: NO GROWTH

## 2015-06-19 NOTE — Discharge Summary (Signed)
Lee Gonzalez, is a 65 y.o. male  DOB May 02, 1950  MRN [id number].  Admission date:  06/14/2015  Admitting Physician  Ihor Austin, MD  Discharge Date:  06/18/2015   Primary MD  No PCP Per Patient  Recommendations for primary care physician for things to follow:   Follow-up with [facility] program physician in one week   Admission Diagnosis  Sepsis, due to unspecified organism [facility]) [A41.9]   Discharge Diagnosis  Sepsis, due to unspecified organism [facility]) [A41.9]    Principal Problem:   Altered mental status Active Problems:   Sepsis [facility])      Past Medical History  Diagnosis Date  . Stroke (HCC)   . Diabetes mellitus without complication (HCC)   . Hypertension     Past Surgical History  Procedure Laterality Date  . None         History of present illness and  Hospital Course:     Kindly see H&P for history of present illness and admission details, please review complete Labs, Consult reports and Test reports for all details in brief  HPI  from the history and physical done on the day of admission  65 year old male patient with history of diabetes mellitus type 2, CVA, hypertension on now warfarin admitted because of altered mental status. Admitted for sepsis, dehydration.  Hospital Course  Altered mental status with metabolic encephalopathy secondary to dehydration: Patient  diuretics are held, started on IV hydration, started on empiric vancomycin, Zosyn. CT  head did not show any acute changes. Patient chest x-ray did not show pneumonia, urine cultures are negative. Patient is seen by neurology and Dr.Reynolds  did not think patient needed any LP. Patient  vancomycin, Zosyn have been stopped as there is no evidence of infection. Mental Status improved with IV hydration 2.facial pain patient is on  Tegretol. #3 essential hypertension: Patient to home medications are restarted. 4.Chronic atrial fibrillation ; when he came Coumadin was held in preparation  for possible LP but we started the Coumadin after the LP plan is not pursued. Advised to continue Coumadin. 5.History of CVA patient is on aspirin, Lipitor 6.Deconditioning physical therapy recommended SNIF but patient followed by the pace program and the wife wanted to take him home and have a rehabilitation by [facility]ACE programme 7.Diabetes mellitus type 2: Patient is on metformin, Januvia.   Discharge Condition: stable   Follow UP  Follow-up Information    Follow up with [facility] programme physician.        Discharge Instructions  and  Discharge Medications        Medication List    TAKE these medications        amLODipine 10 MG tablet  Commonly known as:  NORVASC  Take 1 tablet (10 mg total) by mouth daily.     atorvastatin 40 MG tablet  Commonly known as:  LIPITOR  Take 40 mg by mouth daily.     b complex vitamins capsule  Take 1 capsule by mouth daily.     carbamazepine 200 MG tablet  Commonly known as:  TEGRETOL  Take 200 mg by mouth 3 (three) times daily.     citalopram 10 MG tablet  Commonly known as:  CELEXA  Take 10 mg by mouth daily.     clonazePAM 0.5 MG tablet  Commonly known as:  KLONOPIN  Take 0.5 mg by mouth at bedtime.     felodipine 10 MG 24 hr tablet  Commonly known as:  PLENDIL  Take  10 mg by mouth 2 (two) times daily.     furosemide 20 MG tablet  Commonly known as:  LASIX  Take 20 mg by mouth every Monday, Wednesday, and Friday.     isosorbide-hydrALAZINE 20-37.5 MG tablet  Commonly known as:  BIDIL  Take 2 tablets by mouth 2 (two) times daily.     loratadine 10 MG tablet  Commonly known as:  CLARITIN  Take 10 mg by mouth daily.     magnesium oxide 400 (241.3 Mg) MG tablet  Commonly known as:  MAG-OX  Take 400 mg by mouth daily.     metoprolol succinate 50 MG 24 hr tablet   Commonly known as:  TOPROL-XL  Take 150 mg by mouth daily. Take with or immediately following a meal.     nortriptyline 10 MG capsule  Commonly known as:  PAMELOR  Take 20 mg by mouth at bedtime.     quinapril-hydrochlorothiazide 20-12.5 MG tablet  Commonly known as:  ACCURETIC  Take 3 tablets by mouth daily.     sitaGLIPtin-metformin 50-1000 MG tablet  Commonly known as:  JANUMET  Take 1 tablet by mouth daily.     timolol 0.5 % ophthalmic solution  Commonly known as:  TIMOPTIC  Place 1 drop into both eyes at bedtime.     Vitamin D (Ergocalciferol) 50000 units Caps capsule  Commonly known as:  DRISDOL  Take 50,000 Units by mouth every 30 (thirty) days.     warfarin 4 MG tablet  Commonly known as:  COUMADIN  Take 4 mg by mouth daily. Pt takes with a 7.5mg  tablet.     warfarin 7.5 MG tablet  Commonly known as:  COUMADIN  Take 7.5 mg by mouth daily. Pt takes with a  tablet.          Diet and Activity recommendation: See Discharge Instructions above   Consults obtained - neurology   Major procedures and Radiology Reports - PLEASE review detailed and final reports for all details, in brief -      Dg Chest 1 View  06/14/2015  CLINICAL DATA:  Shortness of breath. EXAM: CHEST 1 VIEW COMPARISON:  June 14, 2015. FINDINGS: Stable cardiomediastinal silhouette. No pneumothorax is noted. Right lung is clear. Mild left basilar opacity is noted concerning for atelectasis or infiltrate with possible mild associated pleural effusion. Bony thorax is unremarkable. IMPRESSION: Mild left basilar opacity is noted concerning for atelectasis or infiltrate with mild associated pleural effusion. Electronically Signed   By: Lupita Raider, M.D.   On: 06/14/2015 15:14   Ct Head Wo Contrast  06/14/2015  CLINICAL DATA:  Acute onset of altered mental status. Initial encounter. EXAM: CT HEAD WITHOUT CONTRAST TECHNIQUE: Contiguous axial images were obtained from the base of the skull through  the vertex without intravenous contrast. COMPARISON:  CT of the head performed 10/30/2009, and MRI of the brain performed 02/26/2013 FINDINGS: There is no evidence of acute infarction, mass lesion, or intra- or extra-axial hemorrhage on CT. Prominence of the ventricles and sulci reflects moderate cortical volume loss. Chronic infarcts are noted at the left parietal lobe, with associated encephalomalacia. A small chronic lacunar infarct is noted at the left basal ganglia. Mild cerebellar atrophy is noted. The brainstem and fourth ventricle are within normal limits. No mass effect or midline shift is seen. There is no evidence of fracture; visualized osseous structures are unremarkable in appearance. The orbits are within normal limits. The paranasal sinuses and mastoid air cells are well-aerated. No  significant soft tissue abnormalities are seen. IMPRESSION: 1. No acute intracranial pathology seen on CT. 2. Moderate cortical volume loss. 3. Chronic infarcts at the left parietal lobe, with associated encephalomalacia. 4. Small chronic lacunar infarct at the left basal ganglia. Electronically Signed   By: Roanna Raider M.D.   On: 06/14/2015 01:59   Mr Brain Wo Contrast  06/15/2015  CLINICAL DATA:  Confusion and mental status change since yesterday. Agitation. Disorientation. Sepsis. Diabetes and hypertension. Anticoagulation with Coumadin. EXAM: MRI HEAD WITHOUT CONTRAST TECHNIQUE: Multiplanar, multiecho pulse sequences of the brain and surrounding structures were obtained without intravenous contrast. COMPARISON:  CT head 06/14/2015.  MR head 09/02/2013. FINDINGS: The patient was unable to remain motionless for the exam. Small or subtle lesions could be overlooked. No evidence for acute infarction, hemorrhage, mass lesion, hydrocephalus, or extra-axial fluid. Global atrophy. Moderate chronic microvascular ischemic change. Chronic LEFT frontal and LEFT parietal infarcts with encephalomalacia and gliosis. Small RIGHT  frontal infarct which was acute in 2015. No midline shift. Chronic deep white matter lacunar infarcts, most notably in the LEFT thalamus and LEFT basal ganglia. Flow voids are maintained throughout the carotid, basilar, and vertebral arteries. There are no areas of chronic hemorrhage. Pituitary, pineal, and cerebellar tonsils unremarkable. No upper cervical lesions. Large incidental occipital scalp lipoma, eccentric to the RIGHT. No sinus or mastoid disease. IMPRESSION: Atrophy and small vessel disease.  No acute intracranial findings. Electronically Signed   By: Elsie Stain M.D.   On: 06/15/2015 14:28   Dg Chest Port 1 View  06/14/2015  CLINICAL DATA:  Acute onset of agitation and altered mental status. Initial encounter. EXAM: PORTABLE CHEST 1 VIEW COMPARISON:  None. FINDINGS: The lungs are well-aerated. Vascular congestion is noted. Mild bibasilar opacities may reflect mild interstitial edema. There is no evidence of pleural effusion or pneumothorax. The cardiomediastinal silhouette is borderline enlarged. No acute osseous abnormalities are seen. IMPRESSION: Vascular congestion and borderline cardiomegaly. Mild bibasilar opacities may reflect mild interstitial edema. Electronically Signed   By: Roanna Raider M.D.   On: 06/14/2015 03:18    Micro Results    Recent Results (from the past 240 hour(s))  Urine culture     Status: Abnormal   Collection Time: 06/14/15  1:05 AM  Result Value Ref Range Status   Specimen Description URINE, RANDOM  Final   Special Requests Normal  Final   Culture MULTIPLE SPECIES PRESENT, SUGGEST RECOLLECTION (A)  Final   Report Status 06/16/2015 FINAL  Final  Blood Culture (routine x 2)     Status: None (Preliminary result)   Collection Time: 06/14/15  2:22 AM  Result Value Ref Range Status   Specimen Description BLOOD LEFT ASSIST CONTROL  Final   Special Requests BOTTLES DRAWN AEROBIC AND ANAEROBIC 5CCAERO,5CCANA  Final   Culture  Setup Time   Final    GRAM  POSITIVE RODS ANAEROBIC BOTTLE ONLY CRITICAL RESULT CALLED TO, READ BACK BY AND VERIFIED WITH: HENRY ZOMPA 06/19/15 0737 MLM    Culture GRAM POSITIVE RODS ANAEROBIC BOTTLE ONLY  Final   Report Status PENDING  Incomplete  Blood Culture (routine x 2)     Status: None   Collection Time: 06/14/15  2:30 AM  Result Value Ref Range Status   Specimen Description BLOOD RIGHT HAND  Final   Special Requests BOTTLES DRAWN AEROBIC AND ANAEROBIC 5CCAERO,5CCANA  Final   Culture NO GROWTH 5 DAYS  Final   Report Status 06/19/2015 FINAL  Final       Today  Subjective:   Earlean PolkaFrank Macwilliams today has no headache,no chest abdominal pain,no new weakness tingling or numbness, feels much better wants to go home today.   Objective:   Blood pressure 137/97, pulse 67, temperature 97.8 F (36.6 C), temperature source Oral, resp. rate 19, height 5\' 8"  (1.727 m), weight 65.363 kg (144 lb 1.6 oz), SpO2 98 %.  No intake or output data in the 24 hours ending 06/19/15 1302  Exam Awake Alert, Oriented x 3, No new F.N deficits, Normal affect [place].AT,PERRAL Supple Neck,No JVD, No cervical lymphadenopathy appriciated.  Symmetrical Chest wall movement, Good air movement bilaterally, CTAB RRR,No Gallops,Rubs or new Murmurs, No Parasternal Heave +ve B.Sounds, Abd Soft, Non tender, No organomegaly appriciated, No rebound -guarding or rigidity. No Cyanosis, Clubbing or edema, No new Rash or bruise  Data Review   CBC w Diff:  Lab Results  Component Value Date   WBC 3.7* 06/18/2015   HGB 13.8 06/18/2015   HCT 42.3 06/18/2015   PLT 173 06/18/2015   LYMPHOPCT 8 06/14/2015   MONOPCT 6 06/14/2015   EOSPCT 0 06/14/2015   BASOPCT 1 06/14/2015    CMP:  Lab Results  Component Value Date   NA 140 06/16/2015   K 4.2 06/16/2015   CL 107 06/16/2015   CO2 25 06/16/2015   BUN 17 06/16/2015   CREATININE 1.20 06/16/2015   PROT 7.8 06/14/2015   ALBUMIN 4.0 06/14/2015   BILITOT 0.5 06/14/2015   ALKPHOS 66 06/14/2015    AST 20 06/14/2015   ALT 13* 06/14/2015  .   Total Time in preparing paper work, data evaluation and todays exam - 35 minutes  Tyger Oka M.D on 06/18/2015 at 1:02 PM    Note: This dictation was prepared with Dragon dictation along with smaller phrase technology. Any transcriptional errors that result from this process are unintentional.

## 2015-06-20 LAB — CULTURE, BLOOD (ROUTINE X 2)

## 2016-06-08 DIAGNOSIS — G8929 Other chronic pain: Secondary | ICD-10-CM | POA: Insufficient documentation

## 2016-06-08 DIAGNOSIS — R519 Headache, unspecified: Secondary | ICD-10-CM | POA: Insufficient documentation

## 2017-12-19 ENCOUNTER — Emergency Department: Payer: Medicare HMO

## 2017-12-19 ENCOUNTER — Encounter: Payer: Self-pay | Admitting: Emergency Medicine

## 2017-12-19 ENCOUNTER — Observation Stay
Admission: EM | Admit: 2017-12-19 | Discharge: 2017-12-22 | Disposition: A | Discharge status: Home Health | Payer: Medicare HMO | Attending: Specialist | Admitting: Specialist

## 2017-12-19 ENCOUNTER — Other Ambulatory Visit: Payer: Self-pay

## 2017-12-19 DIAGNOSIS — R4182 Altered mental status, unspecified: Secondary | ICD-10-CM | POA: Diagnosis present

## 2017-12-19 DIAGNOSIS — Y92008 Other place in unspecified non-institutional (private) residence as the place of occurrence of the external cause: Secondary | ICD-10-CM | POA: Diagnosis not present

## 2017-12-19 DIAGNOSIS — R079 Chest pain, unspecified: Secondary | ICD-10-CM | POA: Insufficient documentation

## 2017-12-19 DIAGNOSIS — R7989 Other specified abnormal findings of blood chemistry: Secondary | ICD-10-CM | POA: Diagnosis present

## 2017-12-19 DIAGNOSIS — N179 Acute kidney failure, unspecified: Secondary | ICD-10-CM | POA: Insufficient documentation

## 2017-12-19 DIAGNOSIS — F329 Major depressive disorder, single episode, unspecified: Secondary | ICD-10-CM | POA: Insufficient documentation

## 2017-12-19 DIAGNOSIS — I129 Hypertensive chronic kidney disease with stage 1 through stage 4 chronic kidney disease, or unspecified chronic kidney disease: Secondary | ICD-10-CM | POA: Diagnosis not present

## 2017-12-19 DIAGNOSIS — F1410 Cocaine abuse, uncomplicated: Secondary | ICD-10-CM

## 2017-12-19 DIAGNOSIS — E1140 Type 2 diabetes mellitus with diabetic neuropathy, unspecified: Secondary | ICD-10-CM | POA: Insufficient documentation

## 2017-12-19 DIAGNOSIS — E1122 Type 2 diabetes mellitus with diabetic chronic kidney disease: Secondary | ICD-10-CM | POA: Diagnosis not present

## 2017-12-19 DIAGNOSIS — S0181XA Laceration without foreign body of other part of head, initial encounter: Secondary | ICD-10-CM | POA: Diagnosis not present

## 2017-12-19 DIAGNOSIS — W19XXXA Unspecified fall, initial encounter: Secondary | ICD-10-CM | POA: Insufficient documentation

## 2017-12-19 DIAGNOSIS — Z7901 Long term (current) use of anticoagulants: Secondary | ICD-10-CM | POA: Insufficient documentation

## 2017-12-19 DIAGNOSIS — I69918 Other symptoms and signs involving cognitive functions following unspecified cerebrovascular disease: Principal | ICD-10-CM | POA: Insufficient documentation

## 2017-12-19 DIAGNOSIS — Z66 Do not resuscitate: Secondary | ICD-10-CM | POA: Insufficient documentation

## 2017-12-19 DIAGNOSIS — Z885 Allergy status to narcotic agent status: Secondary | ICD-10-CM | POA: Insufficient documentation

## 2017-12-19 DIAGNOSIS — I69398 Other sequelae of cerebral infarction: Secondary | ICD-10-CM

## 2017-12-19 DIAGNOSIS — E785 Hyperlipidemia, unspecified: Secondary | ICD-10-CM | POA: Diagnosis not present

## 2017-12-19 DIAGNOSIS — I4892 Unspecified atrial flutter: Secondary | ICD-10-CM | POA: Diagnosis not present

## 2017-12-19 DIAGNOSIS — F070 Personality change due to known physiological condition: Secondary | ICD-10-CM | POA: Diagnosis not present

## 2017-12-19 DIAGNOSIS — F0151 Vascular dementia with behavioral disturbance: Secondary | ICD-10-CM | POA: Insufficient documentation

## 2017-12-19 DIAGNOSIS — R778 Other specified abnormalities of plasma proteins: Secondary | ICD-10-CM

## 2017-12-19 DIAGNOSIS — H409 Unspecified glaucoma: Secondary | ICD-10-CM | POA: Insufficient documentation

## 2017-12-19 DIAGNOSIS — E119 Type 2 diabetes mellitus without complications: Secondary | ICD-10-CM

## 2017-12-19 DIAGNOSIS — N189 Chronic kidney disease, unspecified: Secondary | ICD-10-CM | POA: Diagnosis not present

## 2017-12-19 DIAGNOSIS — F419 Anxiety disorder, unspecified: Secondary | ICD-10-CM | POA: Insufficient documentation

## 2017-12-19 DIAGNOSIS — Z79899 Other long term (current) drug therapy: Secondary | ICD-10-CM | POA: Diagnosis not present

## 2017-12-19 DIAGNOSIS — Z9114 Patient's other noncompliance with medication regimen: Secondary | ICD-10-CM | POA: Insufficient documentation

## 2017-12-19 DIAGNOSIS — I4821 Permanent atrial fibrillation: Secondary | ICD-10-CM | POA: Diagnosis not present

## 2017-12-19 DIAGNOSIS — Z794 Long term (current) use of insulin: Secondary | ICD-10-CM | POA: Insufficient documentation

## 2017-12-19 LAB — URINALYSIS, COMPLETE (UACMP) WITH MICROSCOPIC
BILIRUBIN URINE: NEGATIVE
Bacteria, UA: NONE SEEN
Glucose, UA: >=500 mg/dL — AB
Hgb urine dipstick: NEGATIVE
KETONES UR: NEGATIVE mg/dL
Leukocytes, UA: NEGATIVE
NITRITE: NEGATIVE
PROTEIN: 100 mg/dL — AB
SPECIFIC GRAVITY, URINE: 1.015 (ref 1.005–1.030)
pH: 6 (ref 5.0–8.0)

## 2017-12-19 LAB — URINE DRUG SCREEN, QUALITATIVE (ARMC ONLY)
Amphetamines, Ur Screen: NOT DETECTED
Barbiturates, Ur Screen: NOT DETECTED
Benzodiazepine, Ur Scrn: NOT DETECTED
CANNABINOID 50 NG, UR ~~LOC~~: NOT DETECTED
Cocaine Metabolite,Ur ~~LOC~~: POSITIVE — AB
MDMA (ECSTASY) UR SCREEN: NOT DETECTED
Methadone Scn, Ur: NOT DETECTED
Opiate, Ur Screen: NOT DETECTED
PHENCYCLIDINE (PCP) UR S: NOT DETECTED
TRICYCLIC, UR SCREEN: NOT DETECTED

## 2017-12-19 LAB — COMPREHENSIVE METABOLIC PANEL
ALT: 17 U/L (ref 0–44)
AST: 25 U/L (ref 15–41)
Albumin: 4.2 g/dL (ref 3.5–5.0)
Alkaline Phosphatase: 69 U/L (ref 38–126)
Anion gap: 10 (ref 5–15)
BUN: 20 mg/dL (ref 8–23)
CHLORIDE: 103 mmol/L (ref 98–111)
CO2: 29 mmol/L (ref 22–32)
CREATININE: 1.32 mg/dL — AB (ref 0.61–1.24)
Calcium: 9.9 mg/dL (ref 8.9–10.3)
GFR calc Af Amer: >60 mL/min (ref >60)
GFR, EST NON AFRICAN AMERICAN: 54 mL/min — AB (ref >60)
Glucose, Bld: 269 mg/dL — ABNORMAL HIGH (ref 70–99)
Potassium: 3.9 mmol/L (ref 3.5–5.1)
Sodium: 142 mmol/L (ref 135–145)
Total Bilirubin: 1.2 mg/dL (ref 0.3–1.2)
Total Protein: 8.4 g/dL — ABNORMAL HIGH (ref 6.5–8.1)

## 2017-12-19 LAB — CBC
HEMATOCRIT: 52.2 % — AB (ref 39.0–52.0)
Hemoglobin: 16.1 g/dL (ref 13.0–17.0)
MCH: 27.8 pg (ref 26.0–34.0)
MCHC: 30.8 g/dL (ref 30.0–36.0)
MCV: 90.2 fL (ref 80.0–100.0)
Platelets: 168 10*3/uL (ref 150–400)
RBC: 5.79 MIL/uL (ref 4.22–5.81)
RDW: 13.9 % (ref 11.5–15.5)
WBC: 5.8 10*3/uL (ref 4.0–10.5)
nRBC: 0 % (ref 0.0–0.2)

## 2017-12-19 LAB — HEMOGLOBIN A1C
Hgb A1c MFr Bld: 10 % — ABNORMAL HIGH (ref 4.8–5.6)
MEAN PLASMA GLUCOSE: 240.3 mg/dL

## 2017-12-19 LAB — GLUCOSE, CAPILLARY
GLUCOSE-CAPILLARY: 263 mg/dL — AB (ref 70–99)
GLUCOSE-CAPILLARY: 210 mg/dL — AB (ref 70–99)
Glucose-Capillary: 171 mg/dL — ABNORMAL HIGH (ref 70–99)
Glucose-Capillary: 305 mg/dL — ABNORMAL HIGH (ref 70–99)
Glucose-Capillary: 168 mg/dL — ABNORMAL HIGH (ref 70–99)

## 2017-12-19 LAB — ETHANOL

## 2017-12-19 LAB — TROPONIN I
TROPONIN I: 0.12 ng/mL — AB (ref <0.03)
TROPONIN I: 0.12 ng/mL — AB (ref <0.03)
Troponin I: 0.1 ng/mL (ref <0.03)

## 2017-12-19 MED ORDER — CITALOPRAM HYDROBROMIDE 20 MG PO TABS
10.0000 mg | ORAL_TABLET | Freq: Every day | ORAL | Status: DC
  Administered 2017-12-19 – 2017-12-22 (×4): 10 mg via ORAL
  Filled 2017-12-19 (×4): qty 1

## 2017-12-19 MED ORDER — INSULIN ASPART 100 UNIT/ML ~~LOC~~ SOLN
0.0000 [IU] | Freq: Three times a day (TID) | SUBCUTANEOUS | Status: DC
  Administered 2017-12-19: 3 [IU] via SUBCUTANEOUS
  Administered 2017-12-19: 5 [IU] via SUBCUTANEOUS
  Filled 2017-12-19 (×2): qty 1

## 2017-12-19 MED ORDER — LORATADINE 10 MG PO TABS
10.0000 mg | ORAL_TABLET | Freq: Every day | ORAL | Status: DC
  Administered 2017-12-19 – 2017-12-22 (×2): 10 mg via ORAL
  Filled 2017-12-19 (×4): qty 1

## 2017-12-19 MED ORDER — FELODIPINE ER 10 MG PO TB24
10.0000 mg | ORAL_TABLET | Freq: Two times a day (BID) | ORAL | Status: DC
  Administered 2017-12-19: 10 mg via ORAL
  Filled 2017-12-19 (×2): qty 1

## 2017-12-19 MED ORDER — ONDANSETRON HCL 4 MG PO TABS: 4.0000 mg | ORAL_TABLET | Freq: Four times a day (QID) | ORAL | Status: DC | PRN

## 2017-12-19 MED ORDER — RIVAROXABAN 15 MG PO TABS
15.0000 mg | ORAL_TABLET | Freq: Every day | ORAL | Status: DC
  Administered 2017-12-19 – 2017-12-20 (×2): 15 mg via ORAL
  Filled 2017-12-19 (×2): qty 1

## 2017-12-19 MED ORDER — ACETAMINOPHEN 325 MG PO TABS: 650.0000 mg | ORAL_TABLET | Freq: Four times a day (QID) | ORAL | Status: DC | PRN

## 2017-12-19 MED ORDER — ALBUTEROL SULFATE (2.5 MG/3ML) 0.083% IN NEBU: 2.5000 mg | INHALATION_SOLUTION | RESPIRATORY_TRACT | Status: DC | PRN

## 2017-12-19 MED ORDER — INSULIN ASPART 100 UNIT/ML ~~LOC~~ SOLN: 0.0000 [IU] | Freq: Every day | SUBCUTANEOUS | Status: DC

## 2017-12-19 MED ORDER — METOPROLOL SUCCINATE ER 100 MG PO TB24
100.0000 mg | ORAL_TABLET | Freq: Every day | ORAL | Status: DC
  Administered 2017-12-19 – 2017-12-20 (×2): 100 mg via ORAL
  Filled 2017-12-19 (×2): qty 1

## 2017-12-19 MED ORDER — ACETAMINOPHEN 650 MG RE SUPP: 650.0000 mg | Freq: Four times a day (QID) | RECTAL | Status: DC | PRN

## 2017-12-19 MED ORDER — AMLODIPINE BESYLATE 10 MG PO TABS
10.0000 mg | ORAL_TABLET | Freq: Every day | ORAL | Status: DC
  Administered 2017-12-19 – 2017-12-22 (×4): 10 mg via ORAL
  Filled 2017-12-19 (×4): qty 1

## 2017-12-19 MED ORDER — CARBAMAZEPINE 200 MG PO TABS
200.0000 mg | ORAL_TABLET | Freq: Three times a day (TID) | ORAL | Status: DC
  Administered 2017-12-19: 200 mg via ORAL
  Filled 2017-12-19 (×3): qty 1

## 2017-12-19 MED ORDER — SENNOSIDES-DOCUSATE SODIUM 8.6-50 MG PO TABS: 1.0000 | ORAL_TABLET | Freq: Every evening | ORAL | Status: DC | PRN

## 2017-12-19 MED ORDER — BISACODYL 5 MG PO TBEC: 5.0000 mg | DELAYED_RELEASE_TABLET | Freq: Every day | ORAL | Status: DC | PRN

## 2017-12-19 MED ORDER — ATORVASTATIN CALCIUM 20 MG PO TABS
40.0000 mg | ORAL_TABLET | Freq: Every day | ORAL | Status: DC
  Administered 2017-12-19 – 2017-12-21 (×3): 40 mg via ORAL
  Filled 2017-12-19 (×3): qty 2

## 2017-12-19 MED ORDER — ISOSORB DINITRATE-HYDRALAZINE 20-37.5 MG PO TABS
2.0000 | ORAL_TABLET | Freq: Two times a day (BID) | ORAL | Status: DC
  Administered 2017-12-19 – 2017-12-22 (×5): 2 via ORAL
  Filled 2017-12-19 (×8): qty 2

## 2017-12-19 MED ORDER — CLONAZEPAM 0.5 MG PO TABS
0.5000 mg | ORAL_TABLET | Freq: Every day | ORAL | Status: DC
  Administered 2017-12-19 – 2017-12-21 (×3): 0.5 mg via ORAL
  Filled 2017-12-19 (×3): qty 1

## 2017-12-19 MED ORDER — INSULIN GLARGINE 100 UNIT/ML ~~LOC~~ SOLN
26.0000 [IU] | Freq: Every day | SUBCUTANEOUS | Status: DC
  Administered 2017-12-19 – 2017-12-21 (×3): 26 [IU] via SUBCUTANEOUS
  Filled 2017-12-19 (×3): qty 0.26

## 2017-12-19 MED ORDER — TIMOLOL MALEATE 0.5 % OP SOLN
1.0000 [drp] | Freq: Every day | OPHTHALMIC | Status: DC
  Administered 2017-12-20 – 2017-12-21 (×2): 1 [drp] via OPHTHALMIC
  Filled 2017-12-19: qty 5

## 2017-12-19 MED ORDER — VITAMIN D (ERGOCALCIFEROL) 1.25 MG (50000 UNIT) PO CAPS
50000.0000 [IU] | ORAL_CAPSULE | ORAL | Status: DC
  Filled 2017-12-19: qty 1

## 2017-12-19 MED ORDER — INSULIN ASPART 100 UNIT/ML ~~LOC~~ SOLN
0.0000 [IU] | Freq: Three times a day (TID) | SUBCUTANEOUS | Status: DC
  Administered 2017-12-19: 7 [IU] via SUBCUTANEOUS
  Administered 2017-12-20: 3 [IU] via SUBCUTANEOUS
  Administered 2017-12-20: 2 [IU] via SUBCUTANEOUS
  Administered 2017-12-21 – 2017-12-22 (×3): 3 [IU] via SUBCUTANEOUS
  Filled 2017-12-19 (×6): qty 1

## 2017-12-19 MED ORDER — ONDANSETRON HCL 4 MG/2ML IJ SOLN: 4.0000 mg | Freq: Four times a day (QID) | INTRAMUSCULAR | Status: DC | PRN

## 2017-12-19 MED ORDER — GABAPENTIN 100 MG PO CAPS
100.0000 mg | ORAL_CAPSULE | Freq: Three times a day (TID) | ORAL | Status: DC
  Administered 2017-12-19 – 2017-12-22 (×8): 100 mg via ORAL
  Filled 2017-12-19 (×8): qty 1

## 2017-12-19 MED ORDER — RENA-VITE PO TABS
1.0000 | ORAL_TABLET | Freq: Every day | ORAL | Status: DC
  Administered 2017-12-19 – 2017-12-22 (×4): 1 via ORAL
  Filled 2017-12-19 (×4): qty 1

## 2017-12-19 MED ORDER — SODIUM CHLORIDE 0.9 % IV BOLUS
500.0000 mL | Freq: Once | INTRAVENOUS | Status: AC
  Administered 2017-12-19: 500 mL via INTRAVENOUS

## 2017-12-19 MED ORDER — MAGNESIUM OXIDE 400 (241.3 MG) MG PO TABS
400.0000 mg | ORAL_TABLET | Freq: Every day | ORAL | Status: DC
  Administered 2017-12-21 – 2017-12-22 (×2): 400 mg via ORAL
  Filled 2017-12-19 (×2): qty 1

## 2017-12-19 MED ORDER — ASPIRIN 81 MG PO CHEW
324.0000 mg | CHEWABLE_TABLET | Freq: Once | ORAL | Status: AC
  Administered 2017-12-19: 324 mg via ORAL
  Filled 2017-12-19: qty 4

## 2017-12-19 MED ORDER — NORTRIPTYLINE HCL 10 MG PO CAPS
20.0000 mg | ORAL_CAPSULE | Freq: Every day | ORAL | Status: DC
  Administered 2017-12-20 – 2017-12-21 (×2): 20 mg via ORAL
  Filled 2017-12-19 (×4): qty 2

## 2017-12-19 MED ORDER — SODIUM CHLORIDE 0.9 % IV SOLN: 500.0000 mL | Freq: Once | INTRAVENOUS | Status: AC

## 2017-12-19 MED ORDER — SODIUM CHLORIDE 0.9 % IV SOLN
INTRAVENOUS | Status: DC
  Administered 2017-12-19 (×2): via INTRAVENOUS

## 2017-12-19 NOTE — ED Notes (Addendum)
This RN explained clearance process to patient and daughter who both verbalized understanding. Patient dressed in hospital scrubs by this RN and Mellody Dance, EMT-P. Belongings placed in one bag. (grey tshirt, black shoes, jeans, underwear, belt). Belongings sent with daughter

## 2017-12-19 NOTE — ED Notes (Signed)
Troponin 0.1. Notified MD Don Perking.
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
## 2017-12-19 NOTE — Care Management Note (Signed)
Case Management Note  Patient Details  Name: Lee Gonzalez MRN: [id number] Date of Birth: October 10, 1950  Subjective/Objective:      Patient is from home lives with wife.  Patient is being placed under observation for Altered Mental Status.  Patient was positive for cocaine in the ED.  Daughter is in the ED with the patient when MOON given- patient states okay for daughter to sign for him.  Patient has a current PCP with [facility]- he sees them about every 3 months.  Patient does not drive but his daughter, son, and wife provide transportation when needed.  No barriers to obtaining prescription medications.    Robbie Lis RN, BSN [phone]                Action/Plan:   Expected Discharge Date:                  Expected Discharge Plan:     In-House Referral:     Discharge planning Services  CM Consult  Post Acute Care Choice:    Choice offered to:     DME Arranged:    DME Agency:     HH Arranged:    HH Agency:     Status of Service:  In process, will continue to follow  If discussed at Long Length of Stay Meetings, dates discussed:    Additional Comments:  Allayne Butcher, RN 12/19/2017, 3:02 PM
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
## 2017-12-19 NOTE — Progress Notes (Signed)
Upon rechecking pt's BP, pt became restless, went unresponsive  and stared in the ceiling for 5 sec. Pt has hx of seizure. BP 89/68, Pulse 58, Spo2 100% on r/a, Dr. Imogene Burn notified and received orders for 500 cc bolus. Will administer and continue to monitor.
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
## 2017-12-19 NOTE — Plan of Care (Signed)

## 2017-12-19 NOTE — Plan of Care (Signed)
  Problem: Education: Goal: Knowledge of General Education information will improve Description: Including pain rating scale, medication(s)/side effects and non-pharmacologic comfort measures Outcome: Progressing   Problem: Health Behavior/Discharge Planning: Goal: Ability to manage health-related needs will improve Outcome: Progressing   Problem: Clinical Measurements: Goal: Will remain free from infection Outcome: Progressing   

## 2017-12-19 NOTE — ED Notes (Signed)
Daughter informed this RN that patient had a similar episode several years ago when he stopped taking his insulin and BP medication.

## 2017-12-19 NOTE — Progress Notes (Signed)
Advanced Care Plan.  Purpose of Encounter: CODE STATUS. Parties in Attendance: The patient, his daughter and me. Patient's Decisional Capacity: Yes. Medical Story: Lee Gonzalez  is a 66 y.o. male with a known history of hypertension, diabetes, CVA, vascular dementia, and A. fib on Xarelto.    The patient is being admitted for elevated troponin and cocaine abuse.  I discussed with the patient about his current condition, prognosis and CODE STATUS.  The patient stated that he does not want to be resuscitated or intubated if he has cardiopulmonary arrest.  But the daughter does not agree.  She said she want her father to be resuscitated and intubated if he has cardiopulmonary arrest.  I asked the nurse to confirm with the patient about his wishes.  According to the nurse, the patient does not want to be resuscitated or intubated. Plan:  Code Status: DNR. Time spent discussing advance care planning: 20 minutes.

## 2017-12-19 NOTE — Care Management Obs Status (Signed)
MEDICARE OBSERVATION STATUS NOTIFICATION   Patient Details  Name: Lee Gonzalez MRN: [id number] Date of Birth: 06-25-1950   Medicare Observation Status Notification Given:  Yes    Allayne Butcher, RN 12/19/2017, 3:01 PM

## 2017-12-19 NOTE — ED Notes (Signed)
Dr.Clapacs at bedside  
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
## 2017-12-20 ENCOUNTER — Observation Stay (HOSPITAL_BASED_OUTPATIENT_CLINIC_OR_DEPARTMENT_OTHER)
Admit: 2017-12-20 | Discharge: 2017-12-20 | Disposition: A | Discharge status: Home / Self Care | Payer: Medicare HMO | Attending: Specialist | Admitting: Specialist

## 2017-12-20 DIAGNOSIS — I340 Nonrheumatic mitral (valve) insufficiency: Secondary | ICD-10-CM | POA: Diagnosis not present

## 2017-12-20 DIAGNOSIS — I248 Other forms of acute ischemic heart disease: Secondary | ICD-10-CM

## 2017-12-20 DIAGNOSIS — R4182 Altered mental status, unspecified: Secondary | ICD-10-CM | POA: Diagnosis not present

## 2017-12-20 DIAGNOSIS — I69398 Other sequelae of cerebral infarction: Secondary | ICD-10-CM | POA: Diagnosis not present

## 2017-12-20 DIAGNOSIS — F070 Personality change due to known physiological condition: Secondary | ICD-10-CM | POA: Diagnosis not present

## 2017-12-20 DIAGNOSIS — I4821 Permanent atrial fibrillation: Secondary | ICD-10-CM

## 2017-12-20 DIAGNOSIS — F1410 Cocaine abuse, uncomplicated: Secondary | ICD-10-CM

## 2017-12-20 LAB — CBC
HEMATOCRIT: 40.4 % (ref 39.0–52.0)
HEMOGLOBIN: 12.5 g/dL — AB (ref 13.0–17.0)
MCH: 28.2 pg (ref 26.0–34.0)
MCHC: 30.9 g/dL (ref 30.0–36.0)
MCV: 91.2 fL (ref 80.0–100.0)
Platelets: 124 10*3/uL — ABNORMAL LOW (ref 150–400)
RBC: 4.43 MIL/uL (ref 4.22–5.81)
RDW: 14.1 % (ref 11.5–15.5)
WBC: 5.4 10*3/uL (ref 4.0–10.5)
nRBC: 0 % (ref 0.0–0.2)

## 2017-12-20 LAB — BASIC METABOLIC PANEL
ANION GAP: 7 (ref 5–15)
BUN: 31 mg/dL — AB (ref 8–23)
CHLORIDE: 113 mmol/L — AB (ref 98–111)
CO2: 25 mmol/L (ref 22–32)
Calcium: 8.6 mg/dL — ABNORMAL LOW (ref 8.9–10.3)
Creatinine, Ser: 1.3 mg/dL — ABNORMAL HIGH (ref 0.61–1.24)
GFR calc Af Amer: >60 mL/min (ref >60)
GFR, EST NON AFRICAN AMERICAN: 55 mL/min — AB (ref >60)
GLUCOSE: 126 mg/dL — AB (ref 70–99)
Potassium: 3.8 mmol/L (ref 3.5–5.1)
Sodium: 145 mmol/L (ref 135–145)

## 2017-12-20 LAB — GLUCOSE, CAPILLARY
GLUCOSE-CAPILLARY: 141 mg/dL — AB (ref 70–99)
GLUCOSE-CAPILLARY: 169 mg/dL — AB (ref 70–99)
Glucose-Capillary: 201 mg/dL — ABNORMAL HIGH (ref 70–99)
Glucose-Capillary: 166 mg/dL — ABNORMAL HIGH (ref 70–99)

## 2017-12-20 LAB — ECHOCARDIOGRAM COMPLETE
HEIGHTINCHES: 66 in
WEIGHTICAEL: 2369.6 [oz_av]

## 2017-12-20 LAB — CK: Total CK: 89 U/L (ref 49–397)

## 2017-12-20 LAB — HIV ANTIBODY (ROUTINE TESTING W REFLEX): HIV SCREEN 4TH GENERATION: NONREACTIVE

## 2017-12-20 MED ORDER — METOPROLOL SUCCINATE ER 25 MG PO TB24: 25.0000 mg | ORAL_TABLET | Freq: Every day | ORAL | Status: DC

## 2017-12-20 MED ORDER — RIVAROXABAN 20 MG PO TABS: 20.0000 mg | ORAL_TABLET | Freq: Every day | ORAL | Status: DC

## 2017-12-20 MED ORDER — PERFLUTREN LIPID MICROSPHERE
1.0000 mL | INTRAVENOUS | Status: AC | PRN
  Administered 2017-12-20: 2 mL via INTRAVENOUS
  Filled 2017-12-20: qty 10

## 2017-12-20 NOTE — Progress Notes (Signed)
Inpatient Diabetes Program Recommendations  AACE/ADA: New Consensus Statement on Inpatient Glycemic Control (2015)  Target Ranges:  Prepandial:   less than 140 mg/dL      Peak postprandial:   less than 180 mg/dL (1-2 hours)      Critically ill patients:  140 - 180 mg/dL   Lab Results  Component Value Date   GLUCAP 201 (H) 12/20/2017   HGBA1C 10.0 (H) 12/19/2017    Review of Glycemic ControlResults for BINH, DOTEN (MRN [id number]) as of 12/20/2017 15:44  Ref. Range 12/19/2017 12:33 12/19/2017 16:18 12/19/2017 20:56 12/20/2017 07:55 12/20/2017 12:14  Glucose-Capillary Latest Ref Range: 70 - 99 mg/dL 132 (H) 440 (H) 102 (H) 141 (H) 201 (H)    Diabetes history: DM 2 Outpatient Diabetes medications: Lantus 26 units daily, Janumet 50-1000 one tablet daily Current orders for Inpatient glycemic control:  Novolog sensitive tid with meals and HS, Lantus 26 units daily Inpatient Diabetes Program Recommendations:   Spoke with patient and daughter briefly regarding A1C and diabetes management.  Initially patient told me that he takes his insulin "sometimes".  However upon further discussion, he and his daughter state that he "always" takes his insulin.  They do not report any hypoglycemia.  Note that initially on presentation to ER, family reported that patient was "refusing" insulin?  Blood sugars are improved with home doses of insulin.  Discussed increased A1C with patient and daughter.  I am not sure that patient completely understands or is capable of taking care of his diabetes independently.  Will follow.   Thanks,  Beryl Meager, RN, BC-ADM Inpatient Diabetes Coordinator Pager [phone] (8a-5p)
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
## 2017-12-20 NOTE — Progress Notes (Signed)
*  PRELIMINARY RESULTS* Echocardiogram 2D Echocardiogram has been performed.  Lee Gonzalez 12/20/2017, 2:19 PM
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
## 2017-12-20 NOTE — Progress Notes (Signed)
Patient had 19 beat run of VTACH.  Nurse was with patient at the time and he was trying to have a BM.  Patient was asymptomatic.  Continue to monitor.

## 2017-12-21 DIAGNOSIS — F1410 Cocaine abuse, uncomplicated: Secondary | ICD-10-CM | POA: Diagnosis not present

## 2017-12-21 DIAGNOSIS — I513 Intracardiac thrombosis, not elsewhere classified: Secondary | ICD-10-CM | POA: Diagnosis not present

## 2017-12-21 DIAGNOSIS — R079 Chest pain, unspecified: Secondary | ICD-10-CM | POA: Diagnosis not present

## 2017-12-21 DIAGNOSIS — R7989 Other specified abnormal findings of blood chemistry: Secondary | ICD-10-CM

## 2017-12-21 DIAGNOSIS — R4182 Altered mental status, unspecified: Secondary | ICD-10-CM | POA: Diagnosis not present

## 2017-12-21 LAB — GLUCOSE, CAPILLARY
GLUCOSE-CAPILLARY: 70 mg/dL (ref 70–99)
GLUCOSE-CAPILLARY: 235 mg/dL — AB (ref 70–99)
Glucose-Capillary: 40 mg/dL — CL (ref 70–99)
Glucose-Capillary: 233 mg/dL — ABNORMAL HIGH (ref 70–99)
Glucose-Capillary: 162 mg/dL — ABNORMAL HIGH (ref 70–99)

## 2017-12-21 LAB — BASIC METABOLIC PANEL
Anion gap: 5 (ref 5–15)
BUN: 27 mg/dL — AB (ref 8–23)
CALCIUM: 8.7 mg/dL — AB (ref 8.9–10.3)
CO2: 27 mmol/L (ref 22–32)
CREATININE: 1.44 mg/dL — AB (ref 0.61–1.24)
Chloride: 112 mmol/L — ABNORMAL HIGH (ref 98–111)
GFR calc Af Amer: 57 mL/min — ABNORMAL LOW (ref >60)
GFR, EST NON AFRICAN AMERICAN: 49 mL/min — AB (ref >60)
Glucose, Bld: 59 mg/dL — ABNORMAL LOW (ref 70–99)
Potassium: 3.6 mmol/L (ref 3.5–5.1)
SODIUM: 144 mmol/L (ref 135–145)

## 2017-12-21 MED ORDER — ENOXAPARIN SODIUM 80 MG/0.8ML ~~LOC~~ SOLN
1.0000 mg/kg | Freq: Two times a day (BID) | SUBCUTANEOUS | Status: DC
  Administered 2017-12-21 – 2017-12-22 (×2): 65 mg via SUBCUTANEOUS
  Filled 2017-12-21 (×2): qty 0.8

## 2017-12-21 MED ORDER — INSULIN GLARGINE 100 UNIT/ML ~~LOC~~ SOLN
18.0000 [IU] | Freq: Every day | SUBCUTANEOUS | Status: DC
  Filled 2017-12-21 (×2): qty 0.18
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
## 2017-12-21 NOTE — Progress Notes (Signed)
Progress Note  Patient Name: Lee Gonzalez Date of Encounter: 12/21/2017  Primary Cardiologist: New to [facility]  Subjective   No complaints this morning " Yes sir, no sir", does not elaborate Echocardiogram results discussed with echo technician We reimaged left atrium today on rounds, clear finding of what appears to be left atrial thrombus, chronic, akinetic left atrium, severe dilated  Inpatient Medications    Scheduled Meds: . amLODipine  10 mg Oral Daily  . atorvastatin  40 mg Oral Daily  . citalopram  10 mg Oral Daily  . clonazePAM  0.5 mg Oral QHS  . enoxaparin (LOVENOX) injection  1 mg/kg Subcutaneous Q12H  . gabapentin  100 mg Oral TID  . insulin aspart  0-5 Units Subcutaneous QHS  . insulin aspart  0-9 Units Subcutaneous TID WC  . [START ON 12/22/2017] insulin glargine  18 Units Subcutaneous Daily  . isosorbide-hydrALAZINE  2 tablet Oral BID  . loratadine  10 mg Oral Daily  . magnesium oxide  400 mg Oral Daily  . multivitamin  1 tablet Oral Daily  . nortriptyline  20 mg Oral QHS  . timolol  1 drop Both Eyes QHS  . Vitamin D (Ergocalciferol)  50,000 Units Oral Q30 days   Continuous Infusions:  PRN Meds: acetaminophen **OR** acetaminophen, albuterol, bisacodyl, ondansetron **OR** ondansetron (ZOFRAN) IV, senna-docusate   Vital Signs    Vitals:   12/21/17 0421 12/21/17 0823 12/21/17 0923 12/21/17 1616  BP: 114/61  107/73 139/89  Pulse: (!) 47 (!) 49 (!) 57 (!) 55  Resp: 17  14 14   Temp: 98.4 F (36.9 C)  98 F (36.7 C)   TempSrc: Oral     SpO2: 100%  95% 96%  Weight:      Height:       No intake or output data in the 24 hours ending 12/21/17 1927 Filed Weights   12/19/17 0741 12/19/17 1537  Weight: 68 kg 67.2 kg    Telemetry    Atrial fibrillation- Personally Reviewed  ECG      Physical Exam   Constitutional: Not oriented, alert HENT:  Head: Normocephalic and atraumatic.  Eyes:  no discharge. No scleral icterus.  Neck: Normal range  of motion. Neck supple. No JVD present.  Cardiovascular: Irregularly irregular, normal heart sounds and intact distal pulses. Exam reveals no gallop and no friction rub. No edema No murmur heard. Pulmonary/Chest: Effort normal and breath sounds normal. No stridor. No respiratory distress.  no wheezes.  no rales.  no tenderness.  Abdominal: Soft.  no distension.  no tenderness.  Musculoskeletal: Normal range of motion.  no  tenderness or deformity.  Neurological: Grossly nonfocal Skin: Skin is warm and dry. No rash noted. not diaphoretic.  Psychiatric: Flat affect   Labs    Chemistry Recent Labs  Lab 12/19/17 0745 12/20/17 0516 12/21/17 0609  NA 142 145 144  K 3.9 3.8 3.6  CL 103 113* 112*  CO2 29 25 27   GLUCOSE 269* 126* 59*  BUN 20 31* 27*  CREATININE 1.32* 1.30* 1.44*  CALCIUM 9.9 8.6* 8.7*  PROT 8.4*  --   --   ALBUMIN 4.2  --   --   AST 25  --   --   ALT 17  --   --   ALKPHOS 69  --   --   BILITOT 1.2  --   --   GFRNONAA 54* 55* 49*  GFRAA >60 >60 57*  ANIONGAP 10 7 5  Hematology Recent Labs  Lab 12/19/17 0745 12/20/17 0516  WBC 5.8 5.4  RBC 5.79 4.43  HGB 16.1 12.5*  HCT 52.2* 40.4  MCV 90.2 91.2  MCH 27.8 28.2  MCHC 30.8 30.9  RDW 13.9 14.1  PLT 168 124*    Cardiac Enzymes Recent Labs  Lab 12/19/17 0745 12/19/17 1146 12/19/17 1549  TROPONINI 0.10* 0.12* 0.12*   No results for input(s): TROPIPOC in the last 168 hours.   BNPNo results for input(s): BNP, PROBNP in the last 168 hours.   DDimer No results for input(s): DDIMER in the last 168 hours.   Radiology    No results found.  Cardiac Studies  Echocardiogram Left ventricle: The cavity size was normal. There was moderate   concentric hypertrophy. Systolic function was normal. The   estimated ejection fraction was in the range of 55% to 60%. Wall   motion was normal; there were no regional wall motion   abnormalities. The study is not technically sufficient to allow   evaluation of  LV diastolic function. - Mitral valve: There was mild regurgitation. - Left atrium: The atrium was severely dilated. - Right ventricle: The cavity size was mildly dilated. Wall   thickness was normal. Systolic function was mildly reduced. - Right atrium: The atrium was severely dilated. - Pulmonary arteries: Systolic pressure was moderate to severely   elevated. PA peak pressure: 59 mm Hg (S).   Patient Profile     67 y.o. male   Assessment & Plan    1) Demand Ischemia In the setting of stressors detailed above, mental status change, hypertension, cocaine Likely also exacerbated by LVH Unable to exclude underlying coronary disease Currently not a good candidate for ischemic work-up given confusion Discussed with patient and patient's wife. Reports being asymptomatic  2) permanent atrial fibrillation Rate relatively well controlled We will change to warfarin given what appears to be left atrial thrombus He is not a good candidate for transesophageal echo at this time And given it is well visualized on transthoracic echo, will avoid sedation and procedure Would recommend outpatient follow-up, follow-up echocardiogram at a later date  3) cocaine positive Not very conversant, very confused Cocaine cessation recommended  Discussed with echo technician, heart was reimaged and discussed Discussed with hospitalist service and pharmacy Difficult decision but would recommend to go back on warfarin Discussed transition   Total encounter time more than 45 minutes  Greater than 50% was spent in counseling and coordination of care with the patient    For questions or updates, please contact [facility] Please consult [email or web address] for contact info under        Signed, Julien Nordmann, MD  12/21/2017, 7:27 PM

## 2017-12-21 NOTE — Progress Notes (Signed)
ANTICOAGULATION CONSULT NOTE - Initial Consult  Pharmacy Consult for Lovenox Indication: Atrial Thrombus  Allergies  Allergen Reactions  . Percocet [Oxycodone-Acetaminophen] Other (See Comments)    Reaction: unknown (mild)    Patient Measurements: Height: 5\' 6"  (167.6 cm) Weight: 148 lb 1.6 oz (67.2 kg) IBW/kg (Calculated) : 63.8 Heparin Dosing Weight:    Vital Signs: Temp: 98 F (36.7 C) (11/07 0923) Temp Source: Oral (11/07 0421) BP: 107/73 (11/07 0923) Pulse Rate: 57 (11/07 0923)  Labs: Recent Labs    12/19/17 0745 12/19/17 1146 12/19/17 1549 12/20/17 0516 12/21/17 0609  HGB 16.1  --   --  12.5*  --   HCT 52.2*  --   --  40.4  --   PLT 168  --   --  124*  --   CREATININE 1.32*  --   --  1.30* 1.44*  CKTOTAL  --   --   --  89  --   TROPONINI 0.10* 0.12* 0.12*  --   --     Estimated Creatinine Clearance: 44.9 mL/min (A) (by C-G formula based on SCr of 1.44 mg/dL (H)).   Medical History: Past Medical History:  Diagnosis Date  . Diabetes mellitus without complication (HCC)   . Hypertension   . Stroke [facility])     Assessment: Patient is a 67yo male with h/o of afib on Xarelto. Found to have atrial thrombus. Pharmacy consulted to transition patient to Lovenox.   Goal of Therapy:  Monitor platelets by anticoagulation protocol: Yes   Plan:  Lovenox 65mg  (1mg /kg) SQ q12h. Will begin at 18:00 this evening. Wlll discontinue Xarelto. Plan to start on Warfarin tomorrow after MD talks with wife.  Clovia Cuff, PharmD, BCPS 12/21/2017 12:11 PM

## 2017-12-21 NOTE — Progress Notes (Signed)
Inpatient Diabetes Program Recommendations  AACE/ADA: New Consensus Statement on Inpatient Glycemic Control (2015)  Target Ranges:  Prepandial:   less than 140 mg/dL      Peak postprandial:   less than 180 mg/dL (1-2 hours)      Critically ill patients:  140 - 180 mg/dL   Lab Results  Component Value Date   GLUCAP 70 12/21/2017   HGBA1C 10.0 (H) 12/19/2017    Review of Glycemic Control Results for Lee Gonzalez, Lee Gonzalez (MRN [id number]) as of 12/21/2017 09:02  Ref. Range 12/20/2017 07:55 12/20/2017 12:14 12/20/2017 16:38 12/20/2017 21:29 12/21/2017 08:14 12/21/2017 08:41  Glucose-Capillary Latest Ref Range: 70 - 99 mg/dL 782 (H) 956 (H) 213 (H) 166 (H) 40 (LL) 70   Diabetes history: Type 2 DM Outpatient Diabetes medications: Lantus 26 units daily, Janumet 50-1000 one tablet daily Current orders for Inpatient glycemic control:  Novolog sensitive tid with meals and HS, Lantus 26 units daily Inpatient Diabetes Program Recommendations:   Note low blood sugar this morning. Please reduce Lantus to 18 units daily.   Thanks,  Beryl Meager, RN, BC-ADM Inpatient Diabetes Coordinator Pager [phone] (8a-5p)

## 2017-12-21 NOTE — Progress Notes (Signed)
Taught daughter, Olivia Mackie, how to do lovenox shot.  If possible, let her do one more tomorrow prior to discharge.

## 2017-12-21 NOTE — Progress Notes (Signed)
Hypoglycemic Event  CBG: 40  Treatment: OJ, breakfast, graham crackers.    Symptoms: asymptomatic   Follow-up CBG: Time:8:42 AM  CBG Result:70  Possible Reasons for Event: unknown  Lee Gonzalez [name]

## 2017-12-22 DIAGNOSIS — E119 Type 2 diabetes mellitus without complications: Secondary | ICD-10-CM | POA: Diagnosis not present

## 2017-12-22 DIAGNOSIS — R7989 Other specified abnormal findings of blood chemistry: Secondary | ICD-10-CM | POA: Diagnosis not present

## 2017-12-22 DIAGNOSIS — I69398 Other sequelae of cerebral infarction: Secondary | ICD-10-CM | POA: Diagnosis not present

## 2017-12-22 DIAGNOSIS — I4821 Permanent atrial fibrillation: Secondary | ICD-10-CM | POA: Diagnosis not present

## 2017-12-22 DIAGNOSIS — Z794 Long term (current) use of insulin: Secondary | ICD-10-CM

## 2017-12-22 LAB — GLUCOSE, CAPILLARY
GLUCOSE-CAPILLARY: 75 mg/dL (ref 70–99)
GLUCOSE-CAPILLARY: 63 mg/dL — AB (ref 70–99)
GLUCOSE-CAPILLARY: 85 mg/dL (ref 70–99)
GLUCOSE-CAPILLARY: 225 mg/dL — AB (ref 70–99)

## 2017-12-22 LAB — BASIC METABOLIC PANEL
Anion gap: 5 (ref 5–15)
BUN: 25 mg/dL — AB (ref 8–23)
CO2: 26 mmol/L (ref 22–32)
Calcium: 8.9 mg/dL (ref 8.9–10.3)
Chloride: 111 mmol/L (ref 98–111)
Creatinine, Ser: 1.23 mg/dL (ref 0.61–1.24)
GFR calc Af Amer: >60 mL/min (ref >60)
GFR, EST NON AFRICAN AMERICAN: 59 mL/min — AB (ref >60)
GLUCOSE: 88 mg/dL (ref 70–99)
POTASSIUM: 3.7 mmol/L (ref 3.5–5.1)
SODIUM: 142 mmol/L (ref 135–145)

## 2017-12-22 LAB — CBC
HCT: 42 % (ref 39.0–52.0)
HEMOGLOBIN: 13.2 g/dL (ref 13.0–17.0)
MCH: 28.3 pg (ref 26.0–34.0)
MCHC: 31.4 g/dL (ref 30.0–36.0)
MCV: 89.9 fL (ref 80.0–100.0)
NRBC: 0 % (ref 0.0–0.2)
PLATELETS: 126 10*3/uL — AB (ref 150–400)
RBC: 4.67 MIL/uL (ref 4.22–5.81)
RDW: 14.2 % (ref 11.5–15.5)
WBC: 5.8 10*3/uL (ref 4.0–10.5)

## 2017-12-22 LAB — PROTIME-INR
INR: 1.08
Prothrombin Time: 13.9 seconds (ref 11.4–15.2)

## 2017-12-22 MED ORDER — ENOXAPARIN SODIUM 80 MG/0.8ML ~~LOC~~ SOLN: 1.0000 mg/kg | Freq: Two times a day (BID) | SUBCUTANEOUS | 0 refills | Status: DC

## 2017-12-22 MED ORDER — WARFARIN SODIUM 7.5 MG PO TABS: 7.5000 mg | ORAL_TABLET | Freq: Every day | ORAL | 1 refills | Status: DC

## 2017-12-22 MED ORDER — WARFARIN SODIUM 7.5 MG PO TABS
7.5000 mg | ORAL_TABLET | Freq: Every day | ORAL | Status: DC
  Filled 2017-12-22: qty 1

## 2017-12-22 MED ORDER — WARFARIN - PHARMACIST DOSING INPATIENT: Freq: Every day | Status: DC

## 2017-12-22 MED ORDER — INSULIN GLARGINE 100 UNIT/ML ~~LOC~~ SOLN: 18.0000 [IU] | Freq: Every day | SUBCUTANEOUS | 11 refills | Status: DC

## 2017-12-22 NOTE — Progress Notes (Signed)
Notified MD of pt's blood sugar of 75 and an order to give 18 units of Lantus. Per MD, not to give Lantus. Will continue to monitor and assess.

## 2017-12-22 NOTE — Care Management (Signed)
Patient's daughters address is [address] [place] [address].  Notified Cheryl with [employer].

## 2017-12-22 NOTE — Progress Notes (Signed)
Patient discharged home with home health,instructions explained to daughter and well understood,prescriptions handed to her,escorted by daughter and Psychiatrist via wheel chair.

## 2017-12-22 NOTE — Care Management Note (Deleted)
Case Management Note  Patient Details  Name: Jaquis Picklesimer MRN: [id number] Date of Birth: [birth year]  Subjective/Objective:                    Action/Plan:   Expected Discharge Date:                  Expected Discharge Plan:  Home w Home Health Services  In-House Referral:     Discharge planning Services  CM Consult  Post Acute Care Choice:    Choice offered to:  Adult Children  DME Arranged:    DME Agency:     HH Arranged:  RN, PT, Social Work [employer] Agency:  [facility]  Status of Service:  Completed, signed off  If discussed at [employer], dates discussed:    Additional Comments:  Sherren Kerns, RN 12/22/2017, 9:56 AM
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
## 2017-12-22 NOTE — Plan of Care (Signed)
  Problem: Safety: Goal: Ability to remain free from injury will improve Outcome: Progressing   Problem: Skin Integrity: Goal: Risk for impaired skin integrity will decrease Outcome: Progressing   

## 2017-12-22 NOTE — Progress Notes (Addendum)
Progress Note  Patient Name: Lee Gonzalez Date of Encounter: 12/22/2017  Primary Cardiologist: New to [facility]  Subjective   No complaints this morning No family at the bedside Xarelto held, started on Lovenox bridge with warfarin  Inpatient Medications    Scheduled Meds: . amLODipine  10 mg Oral Daily  . atorvastatin  40 mg Oral Daily  . citalopram  10 mg Oral Daily  . clonazePAM  0.5 mg Oral QHS  . enoxaparin (LOVENOX) injection  1 mg/kg Subcutaneous Q12H  . gabapentin  100 mg Oral TID  . insulin aspart  0-5 Units Subcutaneous QHS  . insulin aspart  0-9 Units Subcutaneous TID WC  . insulin glargine  18 Units Subcutaneous Daily  . isosorbide-hydrALAZINE  2 tablet Oral BID  . loratadine  10 mg Oral Daily  . magnesium oxide  400 mg Oral Daily  . multivitamin  1 tablet Oral Daily  . nortriptyline  20 mg Oral QHS  . timolol  1 drop Both Eyes QHS  . Vitamin D (Ergocalciferol)  50,000 Units Oral Q30 days  . warfarin  7.5 mg Oral q1800  . Warfarin - Pharmacist Dosing Inpatient   Does not apply q1800   Continuous Infusions:  PRN Meds: acetaminophen **OR** acetaminophen, albuterol, bisacodyl, ondansetron **OR** ondansetron (ZOFRAN) IV, senna-docusate   Vital Signs    Vitals:   12/21/17 1616 12/21/17 1944 12/22/17 0408 12/22/17 0816  BP: 139/89 133/87 106/75 (!) 141/86  Pulse: (!) 55 (!) 59 (!) 47 (!) 34  Resp: 14 18 18 16   Temp:  98.4 F (36.9 C) 98.4 F (36.9 C)   TempSrc:  Oral Oral   SpO2: 96% 97% 99% 96%  Weight:      Height:        Intake/Output Summary (Last 24 hours) at 12/22/2017 1642 Last data filed at 12/22/2017 1423 Gross per 24 hour  Intake 720 ml  Output 1450 ml  Net -730 ml   Filed Weights   12/19/17 0741 12/19/17 1537  Weight: 68 kg 67.2 kg    Telemetry    Atrial fibrillation- Personally Reviewed  ECG      Physical Exam   Constitutional: Not oriented, alert HENT:  Head: Normocephalic and atraumatic.  Eyes:  no discharge. No  scleral icterus.  Neck: Normal range of motion. Neck supple. No JVD present.  Cardiovascular: Irregularly irregular, normal heart sounds and intact distal pulses. Exam reveals no gallop and no friction rub. No edema No murmur heard. Pulmonary/Chest: Effort normal and breath sounds normal. No stridor. No respiratory distress.  no wheezes.  no rales.  no tenderness.  Abdominal: Soft.  no distension.  no tenderness.  Musculoskeletal: Normal range of motion.  no  tenderness or deformity.  Neurological: Grossly nonfocal Skin: Skin is warm and dry. No rash noted. not diaphoretic.  Psychiatric: Flat affect   Labs    Chemistry Recent Labs  Lab 12/19/17 0745 12/20/17 0516 12/21/17 0609 12/22/17 0444  NA 142 145 144 142  K 3.9 3.8 3.6 3.7  CL 103 113* 112* 111  CO2 29 25 27 26   GLUCOSE 269* 126* 59* 88  BUN 20 31* 27* 25*  CREATININE 1.32* 1.30* 1.44* 1.23  CALCIUM 9.9 8.6* 8.7* 8.9  PROT 8.4*  --   --   --   ALBUMIN 4.2  --   --   --   AST 25  --   --   --   ALT 17  --   --   --  ALKPHOS 69  --   --   --   BILITOT 1.2  --   --   --   GFRNONAA 54* 55* 49* 59*  GFRAA >60 >60 57* >60  ANIONGAP 10 7 5 5      Hematology Recent Labs  Lab 12/19/17 0745 12/20/17 0516 12/22/17 0444  WBC 5.8 5.4 5.8  RBC 5.79 4.43 4.67  HGB 16.1 12.5* 13.2  HCT 52.2* 40.4 42.0  MCV 90.2 91.2 89.9  MCH 27.8 28.2 28.3  MCHC 30.8 30.9 31.4  RDW 13.9 14.1 14.2  PLT 168 124* 126*    Cardiac Enzymes Recent Labs  Lab 12/19/17 0745 12/19/17 1146 12/19/17 1549  TROPONINI 0.10* 0.12* 0.12*   No results for input(s): TROPIPOC in the last 168 hours.   BNPNo results for input(s): BNP, PROBNP in the last 168 hours.   DDimer No results for input(s): DDIMER in the last 168 hours.   Radiology    No results found.  Cardiac Studies  Echocardiogram Left ventricle: The cavity size was normal. There was moderate   concentric hypertrophy. Systolic function was normal. The   estimated ejection  fraction was in the range of 55% to 60%. Wall   motion was normal; there were no regional wall motion   abnormalities. The study is not technically sufficient to allow   evaluation of LV diastolic function. - Mitral valve: There was mild regurgitation. - Left atrium: The atrium was severely dilated. - Right ventricle: The cavity size was mildly dilated. Wall   thickness was normal. Systolic function was mildly reduced. - Right atrium: The atrium was severely dilated. - Pulmonary arteries: Systolic pressure was moderate to severely   elevated. PA peak pressure: 59 mm Hg (S).   Patient Profile     67 y.o. male   Assessment & Plan    1) Demand Ischemia In the setting of stressors detailed above, mental status change, hypertension, cocaine Likely also exacerbated by LVH Unable to exclude underlying coronary disease No family to discuss with, he is a poor candidate for ischemic work-up at this time Follow-up in clinic, consider ischemic work-up if he has symptoms concerning for angina  2) permanent atrial fibrillation Rate relatively well controlled Xarelto held and changed to warfarin Will have Lovenox bridging  3Left atrial thrombus Seen on transthoracic echo Warfarin  4) cocaine positive Not very conversant, pleasant, not oriented Cocaine cessation recommended    Total encounter time more than 25 minutes  Greater than 50% was spent in counseling and coordination of care with the patient    For questions or updates, please contact [facility] Please consult [email or web address] for contact info under        Signed, Julien Nordmann, MD  12/22/2017, 4:42 PM

## 2017-12-22 NOTE — Progress Notes (Signed)
ANTICOAGULATION CONSULT NOTE  Pharmacy Consult for warfarin Indication: left atrial thrombus  Allergies  Allergen Reactions  . Percocet [Oxycodone-Acetaminophen] Other (See Comments)    Reaction: unknown (mild)    Patient Measurements: Height: 5\' 6"  (167.6 cm) Weight: 148 lb 1.6 oz (67.2 kg) IBW/kg (Calculated) : 63.8  Vital Signs: Temp: 98.4 F (36.9 C) (11/08 0408) Temp Source: Oral (11/08 0408) BP: 141/86 (11/08 0816) Pulse Rate: 34 (11/08 0816)  Labs: Recent Labs    12/19/17 1146 12/19/17 1549 12/20/17 0516 12/21/17 0609 12/22/17 0444  HGB  --   --  12.5*  --  13.2  HCT  --   --  40.4  --  42.0  PLT  --   --  124*  --  126*  CREATININE  --   --  1.30* 1.44* 1.23  CKTOTAL  --   --  89  --   --   TROPONINI 0.12* 0.12*  --   --   --     Estimated Creatinine Clearance: 52.6 mL/min (by C-G formula based on SCr of 1.23 mg/dL).   Medical History: Past Medical History:  Diagnosis Date  . Diabetes mellitus without complication (HCC)   . Hypertension   . Stroke [facility])     Assessment: 67 year old male with h/o atrial fibrillation on Xarelto PTA. TTE 11/7 showed left atrial thrombus. Patient transitioned to Lovenox 65 mg BID with plan to start warfarin. Patient to go home with daughter who has been taught to give Lovenox injections.  Plan for patient to discharge today.  Goal of Therapy:  INR 2-3 Monitor platelets by anticoagulation protocol: Yes   Plan:  Will dose warfarin more cautiously for the next 3 days as patient will not be able to get his INR checked until Monday. Will give 7.5 mg daily for discharge with plan for patient to have close follow up on Monday to get his INR checked. In the event that the patient remains in the hospital past today, pharmacy will order INR and CBC with morning labs and continue to dose warfarin.  Pricilla Riffle, PharmD Pharmacy Resident  12/22/2017 9:30 AM

## 2017-12-25 ENCOUNTER — Telehealth: Payer: Self-pay | Admitting: *Deleted

## 2018-07-12 ENCOUNTER — Telehealth: Payer: Self-pay | Admitting: *Deleted

## 2018-07-12 ENCOUNTER — Other Ambulatory Visit: Payer: Self-pay

## 2018-07-12 ENCOUNTER — Inpatient Hospital Stay: Payer: Medicare Other | Admitting: Oncology

## 2018-07-12 NOTE — Telephone Encounter (Signed)
Dr. Vida Rigger called today to say that patient is coming to see Dr. Smith Robert tom. And wanted her to know that he had done PT/INR and it was 1.3. He will fax it to the office to make sure she has the result. If Dr. Smith Robert needs to discuss anything she can reach him at the office number [phone]

## 2018-07-13 ENCOUNTER — Encounter: Payer: Self-pay | Admitting: Oncology

## 2018-07-13 ENCOUNTER — Inpatient Hospital Stay: Payer: Medicare Other | Attending: Oncology | Admitting: Oncology

## 2018-07-13 ENCOUNTER — Other Ambulatory Visit: Payer: Self-pay

## 2018-07-13 DIAGNOSIS — Z87891 Personal history of nicotine dependence: Secondary | ICD-10-CM | POA: Diagnosis not present

## 2018-07-13 DIAGNOSIS — Z79899 Other long term (current) drug therapy: Secondary | ICD-10-CM

## 2018-07-13 DIAGNOSIS — I513 Intracardiac thrombosis, not elsewhere classified: Secondary | ICD-10-CM | POA: Diagnosis not present

## 2018-07-13 DIAGNOSIS — E119 Type 2 diabetes mellitus without complications: Secondary | ICD-10-CM

## 2018-07-13 DIAGNOSIS — I10 Essential (primary) hypertension: Secondary | ICD-10-CM | POA: Diagnosis not present

## 2018-07-18 ENCOUNTER — Encounter: Payer: Self-pay | Admitting: Oncology

## 2018-07-18 NOTE — Progress Notes (Signed)
I connected with Lee Gonzalez on 07/18/18 at  1:30 PM EDT by video enabled telemedicine visit and verified that I am speaking with the correct person using two identifiers.   I discussed the limitations, risks, security and privacy concerns of performing an evaluation and management service by telemedicine and the availability of in-person appointments. I also discussed with the patient that there may be a patient responsible charge related to this service. The patient expressed understanding and agreed to proceed.  Other persons participating in the visit and their role in the encounter:  Patients wife   Patient's location:  home Provider's location:  work   Reason for referral- anticoagulation management   Referring physician- [facility]   History of present illness: Patient is a 68 year old male with a past medical history significant for cocaine abuse, hypertension, stroke, diabetes among other medical problems.  He was admitted to the hospital in November 2019 for altered mental status possibly secondary to cocaine abuse.  At that time he was found to have elevated troponin as well as right atrial thrombus.  He was seen by cardiology and was started on Coumadin.  Prior to that patient had an episode of stroke in 2015 prior to that and was on Xarelto for about a year which was stopped.  Currently patient continues to be on Coumadin and has been referred to [place] for anticoagulation management.  Patient is a poor historian. Presently reports some fatigue. Appetite is fair. No unintentional weight loss. No hospitalizations since nov 2019.   No h/o DVT and PE. No family history of thromboembolism  Review of Systems  Constitutional: Positive for malaise/fatigue. Negative for chills, fever and weight loss.  HENT: Negative for congestion, ear discharge and nosebleeds.   Eyes: Negative for blurred vision.  Respiratory: Negative for cough, hemoptysis, sputum production, shortness of  breath and wheezing.   Cardiovascular: Negative for chest pain, palpitations, orthopnea and claudication.  Gastrointestinal: Negative for abdominal pain, blood in stool, constipation, diarrhea, heartburn, melena, nausea and vomiting.  Genitourinary: Negative for dysuria, flank pain, frequency, hematuria and urgency.  Musculoskeletal: Negative for back pain, joint pain and myalgias.  Skin: Negative for rash.  Neurological: Negative for dizziness, tingling, focal weakness, seizures, weakness and headaches.  Endo/Heme/Allergies: Does not bruise/bleed easily.  Psychiatric/Behavioral: Negative for depression and suicidal ideas. The patient does not have insomnia.     No Active Allergies  Past Medical History:  Diagnosis Date  . Diabetes mellitus without complication (HCC)   . Hypertension   . Stroke [facility])     Past Surgical History:  Procedure Laterality Date  . none      Social History   Socioeconomic History  . Marital status: Married    Spouse name: Not on file  . Number of children: Not on file  . Years of education: Not on file  . Highest education level: Not on file  Occupational History  . Occupation: retired  Engineer, production  . Financial resource strain: Patient refused  . Food insecurity:    Worry: Patient refused    Inability: Patient refused  . Transportation needs:    Medical: Patient refused    Non-medical: Patient refused  Tobacco Use  . Smoking status: Former Smoker    Packs/day: 0.50    Years: 10.00    Pack years: 5.00    Types: Cigarettes    Last attempt to quit: 07/12/1997    Years since quitting: 21.0  . Smokeless tobacco: Never Used  Substance and Sexual  Activity  . Alcohol use: No    Alcohol/week: 0.0 standard drinks  . Drug use: No  . Sexual activity: Never  Lifestyle  . Physical activity:    Days per week: Patient refused    Minutes per session: Patient refused  . Stress: Patient refused  Relationships  . Social connections:    Talks on  phone: Patient refused    Gets together: Patient refused    Attends religious service: Patient refused    Active member of club or organization: Patient refused    Attends meetings of clubs or organizations: Patient refused    Relationship status: Patient refused  . Intimate partner violence:    Fear of current or ex partner: Patient refused    Emotionally abused: Patient refused    Physically abused: Patient refused    Forced sexual activity: Patient refused  Other Topics Concern  . Not on file  Social History Narrative  . Not on file    Family History  Problem Relation Age of Onset  . Diabetes Mellitus II Father   . Cancer Mother      Current Outpatient Medications:  .  Acetaminophen 500 MG coapsule, Take 1 tablet by mouth as needed., Disp: , Rfl:  .  amLODipine (NORVASC) 10 MG tablet, Take 1 tablet (10 mg total) by mouth daily., Disp: 30 tablet, Rfl: 0 .  atorvastatin (LIPITOR) 40 MG tablet, Take 40 mg by mouth daily., Disp: , Rfl:  .  b complex vitamins capsule, Take 1 capsule by mouth daily., Disp: , Rfl:  .  baclofen (LIORESAL) 10 MG tablet, Take 1 tablet by mouth 3 (three) times daily., Disp: , Rfl:  .  citalopram (CELEXA) 10 MG tablet, Take 10 mg by mouth daily., Disp: , Rfl:  .  clonazePAM (KLONOPIN) 0.5 MG tablet, Take 0.5 mg by mouth at bedtime., Disp: , Rfl:  .  enoxaparin (LOVENOX) 80 MG/0.8ML injection, Inject 0.65 mLs (65 mg total) into the skin every 12 (twelve) hours for 10 days., Disp: 22.4 Syringe, Rfl: 0 .  felodipine (PLENDIL) 10 MG 24 hr tablet, Take 1 tablet by mouth 2 (two) times a day. , Disp: , Rfl:  .  furosemide (LASIX) 20 MG tablet, Take 20 mg by mouth every Monday, Wednesday, and Friday., Disp: , Rfl:  .  gabapentin (NEURONTIN) 100 MG capsule, Take 100 mg by mouth 3 (three) times daily., Disp: , Rfl:  .  insulin glargine (LANTUS) 100 UNIT/ML injection, Inject 0.18 mLs (18 Units total) into the skin daily. At 0600, Disp: 10 mL, Rfl: 11 .   isosorbide-hydrALAZINE (BIDIL) 20-37.5 MG tablet, Take 2 tablets by mouth 2 (two) times daily., Disp: , Rfl:  .  loratadine (CLARITIN) 10 MG tablet, Take 10 mg by mouth daily., Disp: , Rfl:  .  magnesium oxide (MAG-OX) 400 MG tablet, Take 1 tablet by mouth 1 day or 1 dose., Disp: , Rfl:  .  metoprolol succinate (TOPROL-XL) 50 MG 24 hr tablet, Take 1 tablet by mouth 1 day or 1 dose., Disp: , Rfl:  .  nortriptyline (PAMELOR) 10 MG capsule, Take 20 mg by mouth at bedtime., Disp: , Rfl:  .  Nutritional Supplements (ENSURE ACTIVE PO), Take 1 Bottle by mouth at bedtime as needed., Disp: , Rfl:  .  quinapril-hydrochlorothiazide (ACCURETIC) 20-12.5 MG tablet, Take 2 tablets by mouth 1 day or 1 dose., Disp: , Rfl:  .  Rivaroxaban (XARELTO) 15 MG TABS tablet, Take 1 tablet by mouth at bedtime., Disp: ,  Rfl:  .  sitaGLIPtin-metformin (JANUMET) 50-1000 MG tablet, Take 1 tablet by mouth daily., Disp: , Rfl:  .  timolol (TIMOPTIC) 0.5 % ophthalmic solution, Place 1 drop into both eyes at bedtime., Disp: , Rfl:  .  Vitamin D, Ergocalciferol, (DRISDOL) 50000 units CAPS capsule, Take 50,000 Units by mouth every 30 (thirty) days., Disp: , Rfl:   No results found.  No images are attached to the encounter.   CMP Latest Ref Rng & Units 12/22/2017  Glucose 70 - 99 mg/dL 88  BUN 8 - 23 mg/dL 01(E)  Creatinine 0.71 - 1.24 mg/dL 2.19  Sodium 758 - 832 mmol/L 142  Potassium 3.5 - 5.1 mmol/L 3.7  Chloride 98 - 111 mmol/L 111  CO2 22 - 32 mmol/L 26  Calcium 8.9 - 10.3 mg/dL 8.9  Total Protein 6.5 - 8.1 g/dL -  Total Bilirubin 0.3 - 1.2 mg/dL -  Alkaline Phos 38 - 549 U/L -  AST 15 - 41 U/L -  ALT 0 - 44 U/L -   CBC Latest Ref Rng & Units 12/22/2017  WBC 4.0 - 10.5 K/uL 5.8  Hemoglobin 13.0 - 17.0 g/dL 82.6  Hematocrit 41.5 - 52.0 % 42.0  Platelets 150 - 400 K/uL 126(L)     Observation/objective:appears in no acute distress over video visit today. Breathing is non labored  Assessment and plan: 68 yr old  male with prior h/o stroke and recent atrial thrombus referred for anticoagulation management  Patient has no known h/o DVT or PE. He had a stroke in 2015 for which he was on xarelto for a year. He then had right atrial thrombus in nov 2019. Cardiology was consulted and plan was outpatient follow up for ischemic work up. He has not seen cardiology since then.  I will send a message to Dr. Mariah Milling to arrange f/u for this patient with him. Decision as to how long he needs to be on coumadin for right atrial thrombus rests with cardiology.  No hematology follow up needed at this time.  Follow-up instructions:no follow up needed  I discussed the assessment and treatment plan with the patient. The patient was provided an opportunity to ask questions and all were answered. The patient agreed with the plan and demonstrated an understanding of the instructions.   The patient was advised to call back or seek an in-person evaluation if the symptoms worsen or if the condition fails to improve as anticipated.    Visit Diagnosis: 1. Atrial thrombus without antecedent myocardial infarction     Dr. Owens Shark, MD, MPH [facility] [phone] 07/18/2018 3:24 PM

## 2018-07-23 ENCOUNTER — Telehealth: Payer: Self-pay | Admitting: *Deleted

## 2018-07-23 NOTE — Telephone Encounter (Signed)
-----   Message from Minna Merritts, MD sent at 07/22/2018  5:56 PM EDT ----- Needs indefinite warfarin for atrial fib No ischemic workup needed, EF >60% troponin minimally elevated from LVH and cocaine.  Left atrial thrombus, secondary to severely dilated left atrium/atrial fib, needs warfarin  Happy to follow up with him Triage can we arrange an evisit TG   ----- Message ----- From: Sindy Guadeloupe, MD Sent: 07/18/2018   3:50 PM EDT To: Minna Merritts, MD  Hello Dr. Rockey Situ,  Can you please see my recent note which I cced you and arrange follow up for this patient?  Thanks, Astrid Divine

## 2018-07-24 NOTE — Telephone Encounter (Signed)
Virtual Visit Pre-Appointment Phone Call  "(Name), I am calling you today to discuss your upcoming appointment. We are currently trying to limit exposure to the virus that causes COVID-19 by seeing patients at home rather than in the office."  1. "What is the BEST phone number to call the day of the visit?" - include this in appointment notes  2. "Do you have or have access to (through a family member/friend) a smartphone with video capability that we can use for your visit?" a. If yes - list this number in appt notes as "cell" (if different from BEST phone #) and list the appointment type as a VIDEO visit in appointment notes   3. Confirm consent - "In the setting of the current Covid19 crisis, you are scheduled for a (phone or video) visit with your provider on (date) at (time).  Just as we do with many in-office visits, in order for you to participate in this visit, we must obtain consent.  If you'd like, I can send this to your mychart (if signed up) or email for you to review.  Otherwise, I can obtain your verbal consent now.  All virtual visits are billed to your insurance company just like a normal visit would be.  By agreeing to a virtual visit, we'd like you to understand that the technology does not allow for your provider to perform an examination, and thus may limit your provider's ability to fully assess your condition. If your provider identifies any concerns that need to be evaluated in person, we will make arrangements to do so.  Finally, though the technology is pretty good, we cannot assure that it will always work on either your or our end, and in the setting of a video visit, we may have to convert it to a phone-only visit.  In either situation, we cannot ensure that we have a secure connection.  Are you willing to proceed?" STAFF: Did the patient verbally acknowledge consent to telehealth visit? Document YES/NO here: YES  4. Advise patient to be prepared - "Two hours prior  to your appointment, go ahead and check your blood pressure, pulse, oxygen saturation, and your weight (if you have the equipment to check those) and write them all down. When your visit starts, your provider will ask you for this information. If you have an Apple Watch or Kardia device, please plan to have heart rate information ready on the day of your appointment. Please have a pen and paper handy nearby the day of the visit as well."  5. Give patient instructions for MyChart download to smartphone OR Doximity/[email or web address] as below if video visit (depending on what platform provider is using)  6. Inform patient they will receive a phone call 15 minutes prior to their appointment time (may be from unknown caller ID) so they should be prepared to answer    [place] has been deemed a candidate for a follow-up tele-health visit to limit community exposure during the Covid-19 pandemic. I spoke with the patient via phone to ensure availability of phone/video source, confirm preferred email & phone number, and discuss instructions and expectations.  I reminded [name] to be prepared with any vital sign and/or heart rhythm information that could potentially be obtained via home monitoring, at the time of his visit. I reminded [name] to expect a phone call prior to his visit.  Roney Jaffe, RN 07/24/2018 8:42 AM  IF USING DOXIMITY or DOXY.ME - The patient will receive a link just prior to their visit by text.     FULL LENGTH CONSENT FOR TELE-HEALTH VISIT   I hereby voluntarily request, consent and authorize [facility] and its employed or contracted physicians, physician assistants, nurse practitioners or other licensed health care professionals (the Practitioner), to provide me with telemedicine health care services (the "Services") as deemed necessary by the treating Practitioner. I acknowledge and consent to receive the Services  by the Practitioner via telemedicine. I understand that the telemedicine visit will involve communicating with the Practitioner through live audiovisual communication technology and the disclosure of certain medical information by electronic transmission. I acknowledge that I have been given the opportunity to request an in-person assessment or other available alternative prior to the telemedicine visit and am voluntarily participating in the telemedicine visit.  I understand that I have the right to withhold or withdraw my consent to the use of telemedicine in the course of my care at any time, without affecting my right to future care or treatment, and that the Practitioner or I may terminate the telemedicine visit at any time. I understand that I have the right to inspect all information obtained and/or recorded in the course of the telemedicine visit and may receive copies of available information for a reasonable fee.  I understand that some of the potential risks of receiving the Services via telemedicine include:  Marland Kitchen. Delay or interruption in medical evaluation due to technological equipment failure or disruption; . Information transmitted may not be sufficient (e.g. poor resolution of images) to allow for appropriate medical decision making by the Practitioner; and/or  . In rare instances, security protocols could fail, causing a breach of personal health information.  Furthermore, I acknowledge that it is my responsibility to provide information about my medical history, conditions and care that is complete and accurate to the best of my ability. I acknowledge that Practitioner's advice, recommendations, and/or decision may be based on factors not within their control, such as incomplete or inaccurate data provided by me or distortions of diagnostic images or specimens that may result from electronic transmissions. I understand that the practice of medicine is not an exact science and that Practitioner  makes no warranties or guarantees regarding treatment outcomes. I acknowledge that I will receive a copy of this consent concurrently upon execution via email to the email address I last provided but may also request a printed copy by calling the office of [facility].    I understand that my insurance will be billed for this visit.   I have read or had this consent read to me. . I understand the contents of this consent, which adequately explains the benefits and risks of the Services being provided via telemedicine.  . I have been provided ample opportunity to ask questions regarding this consent and the Services and have had my questions answered to my satisfaction. . I give my informed consent for the services to be provided through the use of telemedicine in my medical care  By participating in this telemedicine visit I agree to the above.

## 2018-07-31 ENCOUNTER — Telehealth (INDEPENDENT_AMBULATORY_CARE_PROVIDER_SITE_OTHER): Payer: Medicare Other | Admitting: Cardiovascular Disease

## 2018-07-31 ENCOUNTER — Other Ambulatory Visit: Payer: Self-pay

## 2018-07-31 ENCOUNTER — Telehealth: Payer: Self-pay

## 2018-07-31 DIAGNOSIS — F1410 Cocaine abuse, uncomplicated: Secondary | ICD-10-CM | POA: Diagnosis not present

## 2018-07-31 DIAGNOSIS — I4821 Permanent atrial fibrillation: Secondary | ICD-10-CM | POA: Insufficient documentation

## 2018-07-31 DIAGNOSIS — Z8673 Personal history of transient ischemic attack (TIA), and cerebral infarction without residual deficits: Secondary | ICD-10-CM | POA: Diagnosis not present

## 2018-07-31 DIAGNOSIS — I513 Intracardiac thrombosis, not elsewhere classified: Secondary | ICD-10-CM | POA: Insufficient documentation

## 2018-07-31 NOTE — Progress Notes (Signed)
Virtual Visit via Telephone Note   This visit type was conducted due to national recommendations for restrictions regarding the COVID-19 Pandemic (e.g. social distancing) in an effort to limit this patient's exposure and mitigate transmission in our community.  Due to his co-morbid illnesses, this patient is at least at moderate risk for complications without adequate follow up.  This format is felt to be most appropriate for this patient at this time.  The patient did not have access to video technology/had technical difficulties with video requiring transitioning to audio format only (telephone).  All issues noted in this document were discussed and addressed.  No physical exam could be performed with this format.  Please refer to the patient's chart for his  consent to telehealth for [facility].   I connected with  Lee Gonzalez on 07/31/18 by a video enabled telemedicine application and verified that I am speaking with the correct person using two identifiers. I discussed the limitations of evaluation and management by telemedicine. The patient expressed understanding and agreed to proceed.   Evaluation Performed:  Follow-up visit  Date:  07/31/2018   ID:  Lee Gonzalez, DOB 1950-11-23, MRN [id number]  Patient Location:  [address] [place] [address]   Provider location:   [facility] Captain, [employer] office  PCP:  Donald  Cardiologist:  [place], [facility]   Chief Complaint:   Recovering from hospital stay   History of Present Illness:    Lee Gonzalez is a 68 y.o. male who presents via audio/video conferencing for a telehealth visit today.   The patient does not symptoms concerning for COVID-19 infection (fever, chills, cough, or new SHORTNESS OF BREATH).   Patient has a past medical history of  Atrial fibrillation Left atrial thrombus Hospitalization November 2019, demand ischemia/LVH Cocaine abuse Stroke  2015, was on Xarelto Diabetes In the hospital November 2019 Mental status changes, hypertension, cocaine positive No ischemic work-up secondary to confusion Permanent atrial fibrillation changed to warfarin,Had Lovenox bridge  On echo November 2019 While in the hospital  left atrial thrombus, chronic, akinetic left atrium, severe dilated  Talked to his wife on today's discussion, Since discharge from the hospital he has been doing relatively well except for some weakness over Memorial Day weekend that seem to improve with drinking Gatorade Now moving more, denies having any symptoms, no new complaints Baseline is poor, poor historian Walks around the house, does not go out very much  He is on warfarin, this was started while in the hospital secondary to left atrial thrombus Home nurse visits once a week, checks INR  Recently seen by hematology  Prior CV studies:   The following studies were reviewed today:  Echo Left ventricle: The cavity size was normal. There was moderate   concentric hypertrophy. Systolic function was normal. The   estimated ejection fraction was in the range of 55% to 60%. Wall   motion was normal; there were no regional wall motion   abnormalities. The study is not technically sufficient to allow   evaluation of LV diastolic function. - Mitral valve: There was mild regurgitation. - Left atrium: The atrium was severely dilated. - Right ventricle: The cavity size was mildly dilated. Wall   thickness was normal. Systolic function was mildly reduced. - Right atrium: The atrium was severely dilated. - Pulmonary arteries: Systolic pressure was moderate to severely   elevated. PA peak pressure: 59 mm Hg (S).  - Select images  concerning for atrium thrombus  Past Medical History:  Diagnosis Date  . Diabetes mellitus without complication (HCC)   . Hypertension   . Stroke [facility](HCC)    Past Surgical History:  Procedure Laterality Date  . none       No  outpatient medications have been marked as taking for the 07/31/18 encounter (Telemedicine) with Antonieta IbaGollan, Tashanti Dalporto J, MD.     Allergies:   Patient has no active allergies.   Social History   Tobacco Use  . Smoking status: Former Smoker    Packs/day: 0.50    Years: 10.00    Pack years: 5.00    Types: Cigarettes    Quit date: 07/12/1997    Years since quitting: 21.0  . Smokeless tobacco: Never Used  Substance Use Topics  . Alcohol use: No    Alcohol/week: 0.0 standard drinks  . Drug use: No     Current Outpatient Medications on File Prior to Visit  Medication Sig Dispense Refill  . Acetaminophen 500 MG coapsule Take 1 tablet by mouth as needed.    Marland Kitchen. amLODipine (NORVASC) 10 MG tablet Take 1 tablet (10 mg total) by mouth daily. 30 tablet 0  . atorvastatin (LIPITOR) 40 MG tablet Take 40 mg by mouth daily.    Marland Kitchen. b complex vitamins capsule Take 1 capsule by mouth daily.    . baclofen (LIORESAL) 10 MG tablet Take 1 tablet by mouth 3 (three) times daily.    . citalopram (CELEXA) 10 MG tablet Take 10 mg by mouth daily.    . clonazePAM (KLONOPIN) 0.5 MG tablet Take 0.5 mg by mouth at bedtime.    . enoxaparin (LOVENOX) 80 MG/0.8ML injection Inject 0.65 mLs (65 mg total) into the skin every 12 (twelve) hours for 10 days. 22.4 Syringe 0  . felodipine (PLENDIL) 10 MG 24 hr tablet Take 1 tablet by mouth 2 (two) times a day.     . furosemide (LASIX) 20 MG tablet Take 20 mg by mouth every Monday, Wednesday, and Friday.    . gabapentin (NEURONTIN) 100 MG capsule Take 100 mg by mouth 3 (three) times daily.    . insulin glargine (LANTUS) 100 UNIT/ML injection Inject 0.18 mLs (18 Units total) into the skin daily. At 0600 10 mL 11  . isosorbide-hydrALAZINE (BIDIL) 20-37.5 MG tablet Take 2 tablets by mouth 2 (two) times daily.    Marland Kitchen. loratadine (CLARITIN) 10 MG tablet Take 10 mg by mouth daily.    . magnesium oxide (MAG-OX) 400 MG tablet Take 1 tablet by mouth 1 day or 1 dose.    . metoprolol succinate  (TOPROL-XL) 50 MG 24 hr tablet Take 1 tablet by mouth 1 day or 1 dose.    . nortriptyline (PAMELOR) 10 MG capsule Take 20 mg by mouth at bedtime.    . Nutritional Supplements (ENSURE ACTIVE PO) Take 1 Bottle by mouth at bedtime as needed.    . quinapril-hydrochlorothiazide (ACCURETIC) 20-12.5 MG tablet Take 2 tablets by mouth 1 day or 1 dose.    . Rivaroxaban (XARELTO) 15 MG TABS tablet Take 1 tablet by mouth at bedtime.    . sitaGLIPtin-metformin (JANUMET) 50-1000 MG tablet Take 1 tablet by mouth daily.    . timolol (TIMOPTIC) 0.5 % ophthalmic solution Place 1 drop into both eyes at bedtime.    . Vitamin D, Ergocalciferol, (DRISDOL) 50000 units CAPS capsule Take 50,000 Units by mouth every 30 (thirty) days.     No current facility-administered medications on file prior to visit.  Family Hx: The patient's family history includes Cancer in his mother; Diabetes Mellitus II in his father.  ROS:   Please see the history of present illness.    Review of Systems  Constitutional: Negative.   HENT: Negative.   Respiratory: Negative.   Cardiovascular: Negative.   Gastrointestinal: Negative.   Musculoskeletal: Negative.   Neurological: Negative.   Psychiatric/Behavioral: Negative.   All other systems reviewed and are negative.    Labs/Other Tests and Data Reviewed:    Recent Labs: 12/19/2017: ALT 17 12/22/2017: BUN 25; Creatinine, Ser 1.23; Hemoglobin 13.2; Platelets 126; Potassium 3.7; Sodium 142   Recent Lipid Panel No results found for: CHOL, TRIG, HDL, CHOLHDL, LDLCALC, LDLDIRECT  Wt Readings from Last 3 Encounters:  12/19/17 148 lb 1.6 oz (67.2 kg)  06/14/15 144 lb 1.6 oz (65.4 kg)     Exam:    Vital Signs: Vital signs may also be detailed in the HPI There were no vitals taken for this visit.  Wt Readings from Last 3 Encounters:  12/19/17 148 lb 1.6 oz (67.2 kg)  06/14/15 144 lb 1.6 oz (65.4 kg)   Temp Readings from Last 3 Encounters:  12/22/17 98.4 F (36.9 C)  (Oral)  06/18/15 97.8 F (36.6 C)   BP Readings from Last 3 Encounters:  12/22/17 (!) 141/86  06/18/15 (!) 137/97   Pulse Readings from Last 3 Encounters:  12/22/17 (!) 34  06/18/15 67    134/70, pulse 70 resp 16  Well nourished, well developed male in no acute distress. Constitutional:  oriented to person, place, and time. No distress.    ASSESSMENT & PLAN:    Permanent atrial fibrillation -  Continue warfarin Severely dilated left atrium on echocardiogram with left atrial thrombus previously noted on echo No plan to restore normal sinus rhythm, he is asymptomatic INR managed by primary care  Thrombus of left atrial appendage without antecedent myocardial infarction -  High risk of left atrial thrombus and stroke, will continue warfarin  Cocaine abuse (HCC) -  Cessation recommended  History of CVA (cerebrovascular accident) -  Prior stroke 2015 Relatively debilitated   COVID-19 Education: The signs and symptoms of COVID-19 were discussed with the patient and how to seek care for testing (follow up with PCP or arrange E-visit).  The importance of social distancing was discussed today.  Patient Risk:   After full review of this patients clinical status, I feel that they are at least moderate risk at this time.  Time:   Today, I have spent 25 minutes with the patient with telehealth technology discussing the cardiac and medical problems/diagnoses detailed above   10 min spent reviewing the chart prior to patient visit today   Medication Adjustments/Labs and Tests Ordered: Current medicines are reviewed at length with the patient today.  Concerns regarding medicines are outlined above.   Tests Ordered: No tests ordered   Medication Changes: No changes made   Disposition: Follow-up in 12 months   Signed, Julien Nordmannimothy Jathan Balling, MD  07/31/2018 3:22 PM    [facility] Office [address], [place], [place] [address]

## 2018-07-31 NOTE — Patient Instructions (Addendum)
We need accurate medication list from [facility] health Discharge instructions November 2019 metoprolol was held to do still on our list Xarelto he is no longer taking he is taking warfarin   Medication Instructions:  No changes but we need [place] health medication list  If you need a refill on your cardiac medications before your next appointment, please call your pharmacy.    Lab work: No new labs needed   If you have labs (blood work) drawn today and your tests are completely normal, you will receive your results only by: Marland Kitchen MyChart Message (if you have MyChart) OR . A paper copy in the mail If you have any lab test that is abnormal or we need to change your treatment, we will call you to review the results.   Testing/Procedures: No new testing needed   Follow-Up: At [facility], you and your health needs are our priority.  As part of our continuing mission to provide you with exceptional heart care, we have created designated Provider Care Teams.  These Care Teams include your primary Cardiologist (physician) and Advanced Practice Providers (APPs -  Physician Assistants and Nurse Practitioners) who all work together to provide you with the care you need, when you need it.  . You will need a follow up appointment in 12 months .   Please call our office 2 months in advance to schedule this appointment.    . Providers on your designated Care Team:   . Murray Hodgkins, NP . Christell Faith, PA-C . Marrianne Mood, PA-C  Any Other Special Instructions Will Be Listed Below (If Applicable).  For educational health videos Log in to : [email or web address] Or : [email or web address], password : [email or web address]

## 2018-08-01 NOTE — Telephone Encounter (Signed)
Received medication list from [facility].  Medication list has been updated in Epic to match.

## 2018-08-01 NOTE — Progress Notes (Signed)
Medication list has been updated.

## 2018-08-08 ENCOUNTER — Telehealth: Payer: Self-pay | Admitting: Cardiovascular Disease

## 2018-08-08 NOTE — Telephone Encounter (Signed)
Lee Gonzalez from [employer] patient has been receiving INR checks for sometime and would like to know if it would be a good time to switch to an oral such as Eliquis Please call Lee Gonzalez to discuss at [phone] ext [phone]

## 2018-08-09 NOTE — Telephone Encounter (Signed)
Routed to Dr.Gollan to advise. 

## 2018-08-10 NOTE — Telephone Encounter (Signed)
It is concerning that when he presented to the hospital 12/2017 he was on xarelto and had visible cardiac thrombus eliquis may also present the same risk I would stay on warfarin unless home nursing no longer available

## 2018-08-13 NOTE — Telephone Encounter (Signed)
Returned call to [employer] to make her aware of Dr.Gollan's recommendation. [email or web address].

## 2018-08-16 NOTE — Telephone Encounter (Signed)
Spoke with intake staff at [facility]. She read back and verified Dr Donivan Scull recommendations and will forward them to Dr Trixie Rude.

## 2018-11-08 ENCOUNTER — Ambulatory Visit (INDEPENDENT_AMBULATORY_CARE_PROVIDER_SITE_OTHER): Payer: Medicare Other | Admitting: Podiatry

## 2018-11-08 ENCOUNTER — Other Ambulatory Visit: Payer: Self-pay

## 2018-11-08 ENCOUNTER — Encounter: Payer: Self-pay | Admitting: Podiatry

## 2018-11-08 DIAGNOSIS — M79674 Pain in right toe(s): Secondary | ICD-10-CM | POA: Diagnosis not present

## 2018-11-08 DIAGNOSIS — D689 Coagulation defect, unspecified: Secondary | ICD-10-CM

## 2018-11-08 DIAGNOSIS — B351 Tinea unguium: Secondary | ICD-10-CM | POA: Diagnosis not present

## 2018-11-08 DIAGNOSIS — M79675 Pain in left toe(s): Secondary | ICD-10-CM

## 2018-11-08 DIAGNOSIS — E119 Type 2 diabetes mellitus without complications: Secondary | ICD-10-CM

## 2018-11-08 NOTE — Progress Notes (Signed)
This patient presents to the office with chief complaint of long thick nails and diabetic feet.  This patient  says there  is  no pain and discomfort in his  feet.  This patient says there are long thick painful nails.  These nails are painful walking and wearing shoes.  Patient has no history of infection or drainage from both feet.  Patient is unable to  self treat his own nails . This patient presents  to the office today for treatment of the  long nails and a foot evaluation due to history of  diabetes.  General Appearance  Alert, conversant and in no acute stress.  Vascular  Dorsalis pedis and posterior tibial  pulses are weakly  palpable  bilaterally.  Capillary return is within normal limits  bilaterally. Temperature is within normal limits  bilaterally.  Neurologic  Senn-Weinstein monofilament wire test within normal limits  bilaterally. Muscle power within normal limits bilaterally.  Nails Thick disfigured discolored nails with subungual debris  from hallux to fifth toes bilaterally. No evidence of bacterial infection or drainage bilaterally.  Orthopedic  No limitations of motion of motion feet .  No crepitus or effusions noted.  No bony pathology or digital deformities noted. Mild  HAV  B/L.  Skin  normotropic skin with no porokeratosis noted bilaterally.  No signs of infections or ulcers noted.     Onychomycosis  Diabetes with no foot complications  IE  Debride nails x 10.  A diabetic foot exam was performed and there is no evidence of any vascular or neurologic pathology.   RTC 3 months.   Gardiner Barefoot DPM

## 2019-01-31 ENCOUNTER — Ambulatory Visit: Payer: Medicare Other | Admitting: Podiatry

## 2019-02-18 ENCOUNTER — Encounter: Payer: Self-pay | Admitting: Podiatry

## 2019-02-18 ENCOUNTER — Ambulatory Visit: Payer: Medicare Other | Admitting: Podiatry

## 2019-02-18 ENCOUNTER — Other Ambulatory Visit: Payer: Self-pay

## 2019-02-18 DIAGNOSIS — M79675 Pain in left toe(s): Secondary | ICD-10-CM

## 2019-02-18 DIAGNOSIS — E119 Type 2 diabetes mellitus without complications: Secondary | ICD-10-CM | POA: Diagnosis not present

## 2019-02-18 DIAGNOSIS — M79674 Pain in right toe(s): Secondary | ICD-10-CM

## 2019-02-18 DIAGNOSIS — B351 Tinea unguium: Secondary | ICD-10-CM | POA: Diagnosis not present

## 2019-02-18 DIAGNOSIS — D689 Coagulation defect, unspecified: Secondary | ICD-10-CM

## 2019-02-18 NOTE — Progress Notes (Signed)
Complaint:  Visit Type: Patient returns to my office for continued preventative foot care services. Complaint: Patient states" my nails have grown long and thick and become painful to walk and wear shoes" Patient has been diagnosed with DM with no foot complications. The patient presents for preventative foot care services.  Podiatric Exam: Vascular: dorsalis pedis and posterior tibial pulses are palpable bilateral. Capillary return is immediate. Temperature gradient is WNL. Skin turgor WNL  Sensorium: Normal Semmes Weinstein monofilament test. Normal tactile sensation bilaterally. Nail Exam: Pt has thick disfigured discolored nails with subungual debris noted bilateral entire nail hallux through fifth toenails Ulcer Exam: There is no evidence of ulcer or pre-ulcerative changes or infection. Orthopedic Exam: Muscle tone and strength are WNL. No limitations in general ROM. No crepitus or effusions noted. Foot type and digits show no abnormalities. Bony prominences are unremarkable. Skin: No Porokeratosis. No infection or ulcers  Diagnosis:  Onychomycosis, , Pain in right toe, pain in left toes  Treatment & Plan Procedures and Treatment: Consent by patient was obtained for treatment procedures.   Debridement of mycotic and hypertrophic toenails, 1 through 5 bilateral and clearing of subungual debris. No ulceration, no infection noted.  Return Visit-Office Procedure: Patient instructed to return to the office for a follow up visit 3 months for continued evaluation and treatment.    Yoshio Seliga DPM 

## 2019-05-20 ENCOUNTER — Ambulatory Visit: Payer: Medicare Other | Admitting: Podiatry

## 2019-07-04 ENCOUNTER — Ambulatory Visit: Payer: Medicare Other | Admitting: Podiatry

## 2019-07-18 ENCOUNTER — Other Ambulatory Visit: Payer: Self-pay

## 2019-07-18 ENCOUNTER — Encounter: Payer: Self-pay | Admitting: Podiatry

## 2019-07-18 ENCOUNTER — Ambulatory Visit: Payer: Medicare Other | Admitting: Podiatry

## 2019-07-18 DIAGNOSIS — E119 Type 2 diabetes mellitus without complications: Secondary | ICD-10-CM | POA: Diagnosis not present

## 2019-07-18 DIAGNOSIS — M79675 Pain in left toe(s): Secondary | ICD-10-CM

## 2019-07-18 DIAGNOSIS — B351 Tinea unguium: Secondary | ICD-10-CM | POA: Diagnosis not present

## 2019-07-18 DIAGNOSIS — M79674 Pain in right toe(s): Secondary | ICD-10-CM | POA: Diagnosis not present

## 2019-07-18 DIAGNOSIS — D689 Coagulation defect, unspecified: Secondary | ICD-10-CM

## 2019-07-18 NOTE — Progress Notes (Signed)
This patient returns to my office for at risk foot care.  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  Patient is taking coumadin.  This patient is unable to cut nails himself since the patient cannot reach his nails.These nails are painful walking and wearing shoes.  This patient presents for at risk foot care today. Patient is brought to the office by caregiver.  General Appearance  Alert, conversant and in no acute stress.  Vascular  Dorsalis pedis and posterior tibial  pulses are palpable  bilaterally.  Capillary return is within normal limits  bilaterally. Temperature is within normal limits  bilaterally.  Neurologic  Senn-Weinstein monofilament wire test within normal limits  bilaterally. Muscle power within normal limits bilaterally.  Nails Thick disfigured discolored nails with subungual debris  from hallux to fifth toes bilaterally. No evidence of bacterial infection or drainage bilaterally.  Orthopedic  No limitations of motion  feet .  No crepitus or effusions noted.  No bony pathology or digital deformities noted.  Skin  normotropic skin with no porokeratosis noted bilaterally.  No signs of infections or ulcers noted.     Onychomycosis  Pain in right toes  Pain in left toes  Consent was obtained for treatment procedures.   Mechanical debridement of nails 1-5  bilaterally performed with a nail nipper.  Filed with dremel without incident.    Return office visit   3 months                   Told patient to return for periodic foot care and evaluation due to potential at risk complications.   Helane Gunther DPM

## 2019-10-16 ENCOUNTER — Emergency Department: Payer: Medicare Other

## 2019-10-16 ENCOUNTER — Other Ambulatory Visit: Payer: Self-pay

## 2019-10-16 ENCOUNTER — Inpatient Hospital Stay
Admission: EM | Admit: 2019-10-16 | Discharge: 2019-10-22 | DRG: 100 | Disposition: A | Discharge status: Hospice | Payer: Medicare Other | Attending: Internal Medicine | Admitting: Internal Medicine

## 2019-10-16 DIAGNOSIS — E1165 Type 2 diabetes mellitus with hyperglycemia: Secondary | ICD-10-CM

## 2019-10-16 DIAGNOSIS — R55 Syncope and collapse: Secondary | ICD-10-CM

## 2019-10-16 DIAGNOSIS — N179 Acute kidney failure, unspecified: Secondary | ICD-10-CM

## 2019-10-16 DIAGNOSIS — E1122 Type 2 diabetes mellitus with diabetic chronic kidney disease: Secondary | ICD-10-CM | POA: Diagnosis present

## 2019-10-16 DIAGNOSIS — Z87898 Personal history of other specified conditions: Secondary | ICD-10-CM | POA: Diagnosis not present

## 2019-10-16 DIAGNOSIS — I8290 Acute embolism and thrombosis of unspecified vein: Secondary | ICD-10-CM

## 2019-10-16 DIAGNOSIS — D689 Coagulation defect, unspecified: Secondary | ICD-10-CM | POA: Diagnosis present

## 2019-10-16 DIAGNOSIS — Z833 Family history of diabetes mellitus: Secondary | ICD-10-CM

## 2019-10-16 DIAGNOSIS — U071 COVID-19: Secondary | ICD-10-CM

## 2019-10-16 DIAGNOSIS — R627 Adult failure to thrive: Secondary | ICD-10-CM

## 2019-10-16 DIAGNOSIS — E860 Dehydration: Secondary | ICD-10-CM

## 2019-10-16 DIAGNOSIS — Z87891 Personal history of nicotine dependence: Secondary | ICD-10-CM

## 2019-10-16 DIAGNOSIS — I513 Intracardiac thrombosis, not elsewhere classified: Secondary | ICD-10-CM

## 2019-10-16 DIAGNOSIS — I482 Chronic atrial fibrillation, unspecified: Secondary | ICD-10-CM

## 2019-10-16 DIAGNOSIS — Z23 Encounter for immunization: Secondary | ICD-10-CM

## 2019-10-16 DIAGNOSIS — N183 Chronic kidney disease, stage 3 unspecified: Secondary | ICD-10-CM | POA: Diagnosis present

## 2019-10-16 DIAGNOSIS — I480 Paroxysmal atrial fibrillation: Secondary | ICD-10-CM

## 2019-10-16 DIAGNOSIS — R569 Unspecified convulsions: Secondary | ICD-10-CM

## 2019-10-16 DIAGNOSIS — G40909 Epilepsy, unspecified, not intractable, without status epilepticus: Principal | ICD-10-CM | POA: Diagnosis present

## 2019-10-16 DIAGNOSIS — R29705 NIHSS score 5: Secondary | ICD-10-CM | POA: Diagnosis not present

## 2019-10-16 DIAGNOSIS — I4821 Permanent atrial fibrillation: Secondary | ICD-10-CM | POA: Diagnosis present

## 2019-10-16 DIAGNOSIS — I639 Cerebral infarction, unspecified: Secondary | ICD-10-CM | POA: Diagnosis present

## 2019-10-16 DIAGNOSIS — N189 Chronic kidney disease, unspecified: Secondary | ICD-10-CM

## 2019-10-16 DIAGNOSIS — Z7189 Other specified counseling: Secondary | ICD-10-CM

## 2019-10-16 DIAGNOSIS — Z79899 Other long term (current) drug therapy: Secondary | ICD-10-CM

## 2019-10-16 DIAGNOSIS — Z8673 Personal history of transient ischemic attack (TIA), and cerebral infarction without residual deficits: Secondary | ICD-10-CM

## 2019-10-16 DIAGNOSIS — D65 Disseminated intravascular coagulation [defibrination syndrome]: Secondary | ICD-10-CM

## 2019-10-16 DIAGNOSIS — F1410 Cocaine abuse, uncomplicated: Secondary | ICD-10-CM | POA: Diagnosis present

## 2019-10-16 DIAGNOSIS — Z515 Encounter for palliative care: Secondary | ICD-10-CM

## 2019-10-16 DIAGNOSIS — Z7901 Long term (current) use of anticoagulants: Secondary | ICD-10-CM

## 2019-10-16 DIAGNOSIS — Z794 Long term (current) use of insulin: Secondary | ICD-10-CM

## 2019-10-16 DIAGNOSIS — R931 Abnormal findings on diagnostic imaging of heart and coronary circulation: Secondary | ICD-10-CM

## 2019-10-16 DIAGNOSIS — G9341 Metabolic encephalopathy: Secondary | ICD-10-CM

## 2019-10-16 DIAGNOSIS — Z66 Do not resuscitate: Secondary | ICD-10-CM

## 2019-10-16 HISTORY — DX: Unspecified convulsions: R56.9

## 2019-10-16 LAB — COMPREHENSIVE METABOLIC PANEL
ALT: 16 U/L (ref 0–44)
AST: 40 U/L (ref 15–41)
Albumin: 3.4 g/dL — ABNORMAL LOW (ref 3.5–5.0)
Alkaline Phosphatase: 55 U/L (ref 38–126)
Anion gap: 18 — ABNORMAL HIGH (ref 5–15)
BUN: 36 mg/dL — ABNORMAL HIGH (ref 8–23)
CO2: 19 mmol/L — ABNORMAL LOW (ref 22–32)
Calcium: 8.9 mg/dL (ref 8.9–10.3)
Chloride: 101 mmol/L (ref 98–111)
Creatinine, Ser: 1.62 mg/dL — ABNORMAL HIGH (ref 0.61–1.24)
GFR calc Af Amer: 49 mL/min — ABNORMAL LOW (ref >60)
GFR calc non Af Amer: 43 mL/min — ABNORMAL LOW (ref >60)
Glucose, Bld: 184 mg/dL — ABNORMAL HIGH (ref 70–99)
Potassium: 4.5 mmol/L (ref 3.5–5.1)
Sodium: 138 mmol/L (ref 135–145)
Total Bilirubin: 1.3 mg/dL — ABNORMAL HIGH (ref 0.3–1.2)
Total Protein: 7.1 g/dL (ref 6.5–8.1)

## 2019-10-16 LAB — CBC
HCT: 48.1 % (ref 39.0–52.0)
Hemoglobin: 15.2 g/dL (ref 13.0–17.0)
MCH: 28.1 pg (ref 26.0–34.0)
MCHC: 31.6 g/dL (ref 30.0–36.0)
MCV: 88.9 fL (ref 80.0–100.0)
Platelets: 117 10*3/uL — ABNORMAL LOW (ref 150–400)
RBC: 5.41 MIL/uL (ref 4.22–5.81)
RDW: 13.7 % (ref 11.5–15.5)
WBC: 4.9 10*3/uL (ref 4.0–10.5)
nRBC: 0 % (ref 0.0–0.2)

## 2019-10-16 LAB — URINE DRUG SCREEN, QUALITATIVE (ARMC ONLY)
Amphetamines, Ur Screen: NOT DETECTED
Barbiturates, Ur Screen: NOT DETECTED
Benzodiazepine, Ur Scrn: NOT DETECTED
Cannabinoid 50 Ng, Ur ~~LOC~~: NOT DETECTED
Cocaine Metabolite,Ur ~~LOC~~: POSITIVE — AB
MDMA (Ecstasy)Ur Screen: NOT DETECTED
Methadone Scn, Ur: NOT DETECTED
Opiate, Ur Screen: NOT DETECTED
Phencyclidine (PCP) Ur S: NOT DETECTED
Tricyclic, Ur Screen: NOT DETECTED

## 2019-10-16 LAB — PROTIME-INR
INR: 1.2 (ref 0.8–1.2)
Prothrombin Time: 14.5 seconds (ref 11.4–15.2)

## 2019-10-16 LAB — GLUCOSE, CAPILLARY
Glucose-Capillary: 144 mg/dL — ABNORMAL HIGH (ref 70–99)
Glucose-Capillary: 144 mg/dL — ABNORMAL HIGH (ref 70–99)

## 2019-10-16 LAB — BRAIN NATRIURETIC PEPTIDE: B Natriuretic Peptide: 283.7 pg/mL — ABNORMAL HIGH (ref 0.0–100.0)

## 2019-10-16 LAB — LIPASE, BLOOD: Lipase: 38 U/L (ref 11–51)

## 2019-10-16 LAB — SARS CORONAVIRUS 2 BY RT PCR (HOSPITAL ORDER, PERFORMED IN ~~LOC~~ HOSPITAL LAB): SARS Coronavirus 2: POSITIVE — AB

## 2019-10-16 LAB — CK: Total CK: 374 U/L (ref 49–397)

## 2019-10-16 MED ORDER — ONDANSETRON HCL 4 MG/2ML IJ SOLN
4.0000 mg | Freq: Four times a day (QID) | INTRAMUSCULAR | Status: DC | PRN
  Administered 2019-10-18: 4 mg via INTRAVENOUS
  Filled 2019-10-16: qty 2

## 2019-10-16 MED ORDER — DOCUSATE SODIUM 100 MG PO CAPS
100.0000 mg | ORAL_CAPSULE | Freq: Two times a day (BID) | ORAL | Status: DC
  Administered 2019-10-16 – 2019-10-22 (×7): 100 mg via ORAL
  Filled 2019-10-16 (×9): qty 1

## 2019-10-16 MED ORDER — ACETAMINOPHEN 325 MG PO TABS: 650.0000 mg | ORAL_TABLET | ORAL | Status: DC | PRN

## 2019-10-16 MED ORDER — INSULIN ASPART 100 UNIT/ML ~~LOC~~ SOLN
0.0000 [IU] | SUBCUTANEOUS | Status: DC
  Administered 2019-10-16: 2 [IU] via SUBCUTANEOUS
  Administered 2019-10-17: 09:00:00 5 [IU] via SUBCUTANEOUS
  Administered 2019-10-17: 2 [IU] via SUBCUTANEOUS
  Administered 2019-10-18 (×2): 3 [IU] via SUBCUTANEOUS
  Filled 2019-10-16 (×5): qty 1

## 2019-10-16 MED ORDER — ALBUTEROL SULFATE HFA 108 (90 BASE) MCG/ACT IN AERS
2.0000 | INHALATION_SPRAY | Freq: Once | RESPIRATORY_TRACT | Status: DC | PRN
  Filled 2019-10-16: qty 6.7

## 2019-10-16 MED ORDER — CITALOPRAM HYDROBROMIDE 20 MG PO TABS
10.0000 mg | ORAL_TABLET | Freq: Every day | ORAL | Status: DC
  Administered 2019-10-16 – 2019-10-22 (×5): 10 mg via ORAL
  Filled 2019-10-16 (×6): qty 1

## 2019-10-16 MED ORDER — ADULT MULTIVITAMIN W/MINERALS CH
1.0000 | ORAL_TABLET | Freq: Every day | ORAL | Status: DC
  Administered 2019-10-16 – 2019-10-17 (×2): 1 via ORAL
  Filled 2019-10-16 (×2): qty 1

## 2019-10-16 MED ORDER — EPINEPHRINE 0.3 MG/0.3ML IJ SOAJ
0.3000 mg | Freq: Once | INTRAMUSCULAR | Status: DC | PRN
  Filled 2019-10-16: qty 0.3

## 2019-10-16 MED ORDER — FAMOTIDINE IN NACL 20-0.9 MG/50ML-% IV SOLN: 20.0000 mg | Freq: Once | INTRAVENOUS | Status: DC | PRN

## 2019-10-16 MED ORDER — SENNA 8.6 MG PO TABS
1.0000 | ORAL_TABLET | Freq: Two times a day (BID) | ORAL | Status: DC
  Administered 2019-10-16 – 2019-10-22 (×7): 8.6 mg via ORAL
  Filled 2019-10-16 (×9): qty 1

## 2019-10-16 MED ORDER — METHYLPREDNISOLONE SODIUM SUCC 125 MG IJ SOLR: 125.0000 mg | Freq: Once | INTRAMUSCULAR | Status: DC | PRN

## 2019-10-16 MED ORDER — ONDANSETRON HCL 4 MG PO TABS: 4.0000 mg | ORAL_TABLET | Freq: Four times a day (QID) | ORAL | Status: DC | PRN

## 2019-10-16 MED ORDER — SODIUM CHLORIDE 0.9 % IV SOLN
1340.0000 mg | INTRAVENOUS | Status: AC
  Administered 2019-10-16: 1340 mg via INTRAVENOUS
  Filled 2019-10-16: qty 26.8

## 2019-10-16 MED ORDER — ENOXAPARIN SODIUM 40 MG/0.4ML ~~LOC~~ SOLN
40.0000 mg | SUBCUTANEOUS | Status: DC
  Administered 2019-10-17: 09:00:00 40 mg via SUBCUTANEOUS
  Filled 2019-10-16: qty 0.4

## 2019-10-16 MED ORDER — SODIUM CHLORIDE 0.45 % IV SOLN: INTRAVENOUS | Status: DC

## 2019-10-16 MED ORDER — SODIUM CHLORIDE 0.9 % IV SOLN: INTRAVENOUS | Status: DC | PRN

## 2019-10-16 MED ORDER — FOLIC ACID 1 MG PO TABS
1.0000 mg | ORAL_TABLET | Freq: Every day | ORAL | Status: DC
  Administered 2019-10-16 – 2019-10-17 (×2): 1 mg via ORAL
  Filled 2019-10-16 (×2): qty 1

## 2019-10-16 MED ORDER — ACETAMINOPHEN 650 MG RE SUPP: 650.0000 mg | RECTAL | Status: DC | PRN

## 2019-10-16 MED ORDER — THIAMINE HCL 100 MG PO TABS
100.0000 mg | ORAL_TABLET | Freq: Every day | ORAL | Status: DC
  Administered 2019-10-16 – 2019-10-17 (×2): 100 mg via ORAL
  Filled 2019-10-16 (×3): qty 1

## 2019-10-16 MED ORDER — SODIUM CHLORIDE 0.9 % IV BOLUS
500.0000 mL | Freq: Once | INTRAVENOUS | Status: AC
  Administered 2019-10-16: 500 mL via INTRAVENOUS

## 2019-10-16 MED ORDER — SODIUM CHLORIDE 0.9 % IV SOLN
150.0000 mg | Freq: Two times a day (BID) | INTRAVENOUS | Status: DC
  Filled 2019-10-16: qty 3

## 2019-10-16 MED ORDER — SODIUM CHLORIDE 0.9 % IV SOLN
1200.0000 mg | Freq: Once | INTRAVENOUS | Status: AC
  Administered 2019-10-17: 1200 mg via INTRAVENOUS
  Filled 2019-10-16: qty 10

## 2019-10-16 MED ORDER — DIPHENHYDRAMINE HCL 50 MG/ML IJ SOLN: 50.0000 mg | Freq: Once | INTRAMUSCULAR | Status: DC | PRN

## 2019-10-16 MED ORDER — PHENYTOIN SODIUM EXTENDED 100 MG PO CAPS
100.0000 mg | ORAL_CAPSULE | Freq: Three times a day (TID) | ORAL | Status: DC
  Administered 2019-10-17: 09:00:00 100 mg via ORAL
  Filled 2019-10-16 (×3): qty 1

## 2019-10-16 MED ORDER — SODIUM CHLORIDE 0.9 % IV SOLN
20.0000 mg/kg | INTRAVENOUS | Status: DC
  Filled 2019-10-16: qty 26.8

## 2019-10-16 NOTE — ED Triage Notes (Signed)
Pt daughter reports pt wife is at [facility] with COVID. Pt has hx of seizures as well.

## 2019-10-16 NOTE — ED Triage Notes (Signed)
Pt comes for covid pos. Starts seizing while in triage room. Seizure lasted about 1 min. Pt post-ictal. Pt responding to verbal stimuli at this time.

## 2019-10-16 NOTE — Progress Notes (Signed)
Pharmacy COVID-19 Monoclonal Antibody Screening  Lee Gonzalez was identified as being not hospitalized with symptoms from Covid-19 on admission but an incidental positive PCR has been documented.  The patient may qualify for the use of monoclonal antibodies (mAB) for COVID-19 viral infection to prevent worsening symptoms stemming from Covid-19 infection.  The patient was identified based on a positive COVID-19 PCR and not requiring the use of supplemental oxygen at this time.  This patient meets the FDA criteria for Emergency Use Authorization of casirivimab/imdevimab.  Has a (+) direct SARS-CoV-2 viral test result  Is NOT hospitalized due to COVID-19  Is within 10 days of symptom onset  Has at least one of the high risk factor(s) for progression to severe COVID-19 and/or hospitalization as defined in EUA.  Specific high risk criteria : Older age (>/= 70 yo) and Diabetes  Additionally: The patient has not had a positive COVID-19 PCR in the last 90 days.  The patient is unvaccinated against COVID-19.  Since the patient is unvaccinated and meets high risk criteria, the patient is eligible for mAB administration.   This eligibility and indication for treatment was discussed with the patient's physician: [name]  Plan: Based on the above discussion, it was decided that the patient will receive one dose of mAB combination.Casirivimab/imdevimab has been ordered. Pharmacy will coordinate administration timing with patient's nurse. Recommended infusion monitoring parameters communicated to the nursing team.   [name] D 10/16/2019  8:26 PM

## 2019-10-16 NOTE — ED Provider Notes (Signed)
[employer] [facility] Emergency Department Provider Note   ____________________________________________   First MD Initiated Contact with Patient 10/16/19 1427     (approximate)  I have reviewed the triage vital signs and the nursing notes.   HISTORY  Chief Complaint Seizures  EM caveat: Patient altered mental status  HPI [name] is a 69 y.o. male who had a seizure while being triaged.  Patient currently alert only to his name, knows his name.  Not sure why he is here but thinks he is at hospital  Discussed with the patient's daughter, evidently his wife who he lives with was diagnosed and is admitted to [facility] with coronavirus starting about a week ago.  Patient himself is noted to have a slight cough feeling weak and he actually has been very fatigued not eating well and had a episode where he was passed out on Sunday but did not seek medical care.  Family has been worried about him and his nephew brought him to be evaluated as they have also noticed a cough and concern he might have Covid as well, his son also had.  Patient is not vaccinated against Covid.  He does have a history of seizures and his daughter reports seizures seem to happen at times of illness and has had seizures since he was young, but is not currently on any seizure medicine  Report is that he did not fall or become injured at triage, he had a seizure while being triaged in a chair   Past Medical History:  Diagnosis Date  . Diabetes mellitus without complication (HCC)   . Hypertension   . Seizures (HCC)   . Stroke [facility])     Patient Active Problem List   Diagnosis Date Noted  . Pain due to onychomycosis of toenails of both feet 11/08/2018  . Diabetes mellitus without complication (HCC) 11/08/2018  . Coagulation disorder (HCC) 11/08/2018  . Permanent atrial fibrillation (HCC) 07/31/2018  . 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 Cocaine abuse (HCC) 12/19/2017  . 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 Elevated troponin 12/19/2017  . Head pain, chronic 06/08/2016  . Altered mental status 06/14/2015  . Sepsis (HCC) 06/14/2015  . Convulsions (HCC) 05/02/2014  . Head and face pain 10/28/2013  . History of CVA (cerebrovascular accident) 10/28/2013  . Syncope 10/28/2013    Past Surgical History:  Procedure Laterality Date  . none      Prior to Admission medications   Medication Sig Start Date End Date Taking? Authorizing Provider  amLODipine (NORVASC) 10 MG tablet Take 1 tablet (10 mg total) by mouth daily. 06/18/15   Katha Hamming, MD  atorvastatin (LIPITOR) 40 MG tablet Take 40 mg by mouth daily.    [provider]  baclofen (LIORESAL) 10 MG tablet Take 1 tablet by mouth 3 (three) times daily. 04/24/18   [provider]  cetirizine (ZYRTEC) 10 MG tablet Take 10 mg by mouth daily.    [provider]  citalopram (CELEXA) 10 MG tablet Take 10 mg by mouth daily.    [provider]  cloNIDine (CATAPRES) 0.1 MG tablet Take by mouth. 08/04/17   [provider]  enoxaparin (LOVENOX) 80 MG/0.8ML injection Inject 0.65 mLs (65 mg total) into the skin every 12 (twelve) hours for 10 days. 12/22/17 07/13/18  Houston Siren, MD  fluconazole (DIFLUCAN) 150 MG tablet TAKE 1 TABLET BY MOUTH NOW FOR  YEAST INFECTION, MAY REPEAT IN 3 DAYS IF SYMPTOMS PERSIST 05/14/19   [provider]  furosemide (LASIX) 20 MG tablet Take 20 mg by mouth daily.     [provider]  Insulin Glargine (LANTUS) 100 UNIT/ML Solostar Pen Inject 15 Units into the skin 2 (two) times daily.    [provider]  Insulin Pen Needle (ULTICARE MICRO PEN NEEDLES) 31G X 8 MM MISC by Does not apply route. Use for daily insulin injections    [provider]  metoprolol tartrate (LOPRESSOR) 25 MG tablet Take 25 mg by mouth  2 (two) times daily.    [provider]  phytonadione (VITAMIN K) 5 MG tablet Take 2.5 mg by mouth once.    [provider]  quinapril-hydrochlorothiazide (ACCURETIC) 20-12.5 MG tablet Take 1 tablet by mouth daily.  08/04/17   [provider]  warfarin (COUMADIN) 7.5 MG tablet Take by mouth.    [provider]    Allergies Patient has no known allergies.  Family History  Problem Relation Age of Onset  . Diabetes Mellitus II Father   . Cancer Mother     Social History Social History   Tobacco Use  . Smoking status: Former Smoker    Packs/day: 0.50    Years: 10.00    Pack years: 5.00    Types: Cigarettes    Quit date: 07/12/1997    Years since quitting: 22.2  . Smokeless tobacco: Never Used  Vaping Use  . Vaping Use: Never assessed  Substance Use Topics  . Alcohol use: No    Alcohol/week: 0.0 standard drinks  . Drug use: No    Review of Systems  EM caveat   ____________________________________________   PHYSICAL EXAM:  VITAL SIGNS: ED Triage Vitals  Enc Vitals Group     BP 10/16/19 1426 (!) 164/101     Pulse Rate 10/16/19 1426 76     Resp 10/16/19 1426 18     Temp 10/16/19 1426 99.1 F (37.3 C)     Temp src --      SpO2 10/16/19 1426 100 %     Weight 10/16/19 1413 147 lb 11.3 oz (67 kg)     Height 10/16/19 1413 5\' 6"  (1.676 m)     Head Circumference --      Peak Flow --      Pain Score 10/16/19 1413 0     Pain Loc --      Pain Edu? --      Excl. in GC? --     Constitutional: Alert and oriented to self and thinks he is at a hospital. Well appearing and in no acute distress though just slightly tachypneic. Eyes: Conjunctivae are normal. Head: Atraumatic. Nose: No congestion/rhinnorhea. Mouth/Throat: Mucous membranes are dry. Neck: No stridor.  Cardiovascular: Normal rate, regular rhythm. Grossly normal heart sounds.  Good peripheral circulation. Respiratory: Just slightly tachypneic.  No retractions. Lungs  CTAB. Gastrointestinal: Soft and nontender. No distention. Musculoskeletal: No lower extremity tenderness nor edema. Neurologic:  Normal speech and language. No gross focal neurologic deficits are appreciated no he appears fatigued in all extremities, he did recently have a witnessed generalized seizure and is now seemingly postictal oriented to self and place.  Follows commands without difficulty and is pleasant without any ongoing tremulousness or seizure-like activity.  Skin:  Skin is warm, dry and intact. No rash noted. Psychiatric: Mood and affect are calm. Speech and behavior are normal.  ____________________________________________   LABS (all labs ordered  are listed, but only abnormal results are displayed)  Labs Reviewed  CBC - Abnormal; Notable for the following components:      Result Value   Platelets 117 (*)    All other components within normal limits  COMPREHENSIVE METABOLIC PANEL - Abnormal; Notable for the following components:   CO2 19 (*)    Glucose, Bld 184 (*)    BUN 36 (*)    Creatinine, Ser 1.62 (*)    Albumin 3.4 (*)    Total Bilirubin 1.3 (*)    GFR calc non Af Amer 43 (*)    GFR calc Af Amer 49 (*)    Anion gap 18 (*)    All other components within normal limits  SARS CORONAVIRUS 2 BY RT PCR (HOSPITAL ORDER, PERFORMED IN [facility])  LIPASE, BLOOD  PROTIME-INR  CK  CBG MONITORING, ED   ____________________________________________  EKG  EKG is reviewed inter by me at 1440 Heart rate 79 QRS 140 QTc 470 Atrial fibrillation, no evidence of acute ischemia.  Nonspecific T wave abnormality  Patient medical chart notes chronic A. fib ____________________________________________  RADIOLOGY  DG Chest Portable 1 View  Result Date: 10/16/2019 CLINICAL DATA:  Seizure EXAM: PORTABLE CHEST 1 VIEW COMPARISON:  06/14/2015 FINDINGS: Cardiomegaly. Atherosclerotic calcification of the aortic knob. Prominent bibasilar interstitial markings. No  pleural effusion or pneumothorax. No acute osseous findings. IMPRESSION: Cardiomegaly with prominent bibasilar interstitial markings, which may reflect mild interstitial edema versus atypical/viral infection. Electronically Signed   By: Duanne Guess D.O.   On: 10/16/2019 14:57    Chest x-ray reviewed, cardiomegaly possible bibasilar interstitial markings.  Possible interstitial edema versus atypical infection ____________________________________________   PROCEDURES  Procedure(s) performed: None  Procedures  Critical Care performed: No  ____________________________________________   INITIAL IMPRESSION / ASSESSMENT AND PLAN / ED COURSE  Pertinent labs & imaging results that were available during my care of the patient were reviewed by me and considered in my medical decision making (see chart for details).   Patient here for evaluation for concerns of possibly developing cough and possible Covid symptoms given close contacts.  He is unvaccinated.  He had a syncopal event Sunday, evidently decreased appetite cough weakness and fatigue which is prompted evaluation today.  Had a witnessed generalized seizure that lasted only a couple of minutes, he is now alert slowly improving appears postictal.  No obvious focal deficits, but he is generally weak and somewhat difficult to assess if any subtle deficits exist.  Does have also previous history of stroke.  Reportedly also taking or had previously been on Coumadin  Clinical Course as of Oct 15 1548  Wed Oct 16, 2019  1438 Per daughter, he does have a seizure disorder and regular seizures. He has been sick and wife has COVID in [facility].    [MQ]    Clinical Course User Index [MQ] Sharyn Creamer, MD   ----------------------------------------- 3:48 PM on 10/16/2019 ----------------------------------------- Patient reassessed, his daughter at the bedside.  She provides majority of history.  Patient is alert now, oriented to place daughter and  location.  He appears to be improving.  Feels fatigued, generally weak and thirsty.  Receiving IV fluids, antiepileptic medication ordered based on the patient's history of seizure.  I have elevated suspicion for Covid or at least a high risk of exposure.  Await Covid testing.  Ongoing care signed to Dr. Juliette Alcide, anticipate admission for concerns of seizure, associated syncopal episode Sunday, possible her high risk Covid exposure.  Patient overall improving at this time.  Patient and daughter both understanding of plan including likely need for admission.  Girtha Hake was evaluated in Emergency Department on 10/16/2019 for the symptoms described in the history of present illness. He was evaluated in the context of the global COVID-19 pandemic, which necessitated consideration that the patient might be at risk for infection with the SARS-CoV-2 virus that causes COVID-19. Institutional protocols and algorithms that pertain to the evaluation of patients at risk for COVID-19 are in a state of rapid change based on information released by regulatory bodies including the CDC and federal and state organizations. These policies and algorithms were followed during the patient's care in the ED.  ____________________________________________   FINAL CLINICAL IMPRESSION(S) / ED DIAGNOSES  Final diagnoses:  Seizure (HCC)  Syncope and collapse  AKI (acute kidney injury) (HCC)  Dehydration        Note:  This document was prepared using Dragon voice recognition software and may include unintentional dictation errors       Sharyn Creamer, MD 10/16/19 1550

## 2019-10-16 NOTE — H&P (Signed)
History and Physical    Lee Gonzalez YSA:[id number] DOB: 1950-08-08 DOA: 10/16/2019  PCP: [facility]  Patient coming from: home   Chief Complaint: "passed out" not eating or drinking.   HPI: Lee Gonzalez is a 69 y.o. male with medical history significant of seizure disorder, diabetes mellitus, hypertension, , afib on coumadin, LA thrombus, stroke and cocaine abuse was brought in by daughter who reports about 5 days ago she received a call from her mother that stated patient had passed out.  The daughter told the mother to call EMS but they never did.  Per daughter patient had not been eating or drinking much and in bed.  Sleeping a lot.  While in the ER patient started seizing in the triage room and per nursing it lasted about 1 minute and he was postictal.  Patient then was responding to verbal stimuli at that time.  Unfortunately patient was not very interactive with physical exam for me refused to answer my questions.  Please note information was taken from ER note and also I called the daughter about patient's status.   ED Course:  In the ER he was given phenytoin.  He was also CT of the head that was negative for acute intracranial abnormality.  He was found with Covid positive test.  Apparently patient lives with his wife who was diagnosed and admitted to [facility] with coronavirus about a week ago.  He does have history of cocaine use and his urine toxicology is pending.  His creatinine is 1.62 from baseline of 1.23.  Glucose 184.  BNP 283.7, CK total 374.  Pt will be admitted for witnessed seizure.   Review of Systems: All systems reviewed and otherwise negative.    Past Medical History:  Diagnosis Date  . Diabetes mellitus without complication (HCC)   . Hypertension   . Seizures (HCC)   . Stroke [facility])     Past Surgical History:  Procedure Laterality Date  . none       reports that he quit smoking about 22 years ago. His smoking  use included cigarettes. He has a 5.00 pack-year smoking history. He has never used smokeless tobacco. He reports that he does not drink alcohol and does not use drugs.  No Known Allergies  Family History  Problem Relation Age of Onset  . Diabetes Mellitus II Father   . Cancer Mother      Prior to Admission medications   Medication Sig Start Date End Date Taking? Authorizing Provider  amLODipine (NORVASC) 10 MG tablet Take 1 tablet (10 mg total) by mouth daily. 06/18/15   Katha Hamming, MD  atorvastatin (LIPITOR) 40 MG tablet Take 40 mg by mouth daily.    [provider]  cetirizine (ZYRTEC) 10 MG tablet Take 10 mg by mouth daily.    [provider]  citalopram (CELEXA) 10 MG tablet Take 10 mg by mouth daily.    [provider]  furosemide (LASIX) 20 MG tablet Take 20 mg by mouth daily.     [provider]  Insulin Glargine (LANTUS) 100 UNIT/ML Solostar Pen Inject 15 Units into the skin 2 (two) times daily.    [provider]  Insulin Pen Needle (ULTICARE MICRO PEN NEEDLES) 31G X 8 MM MISC by Does not apply route. Use for daily insulin injections    [provider]  metoprolol tartrate (LOPRESSOR) 25 MG tablet Take 25 mg by mouth 2 (two) times daily.  [provider]  phytonadione (VITAMIN K) 5 MG tablet Take 2.5 mg by mouth once.    [provider]  warfarin (COUMADIN) 7.5 MG tablet Take by mouth.    [provider]    Physical Exam: Vitals:   10/16/19 1630 10/16/19 1700 10/16/19 1730 10/16/19 1830  BP: (!) 153/94 123/78 111/71 132/78  Pulse:  66 77 73  Resp: 16 (!) 21 19 (!) 27  Temp:      SpO2: 100% 100% 100% 100%  Weight:      Height:        Constitutional: NAD, calm, comfortable Vitals:   10/16/19 1630 10/16/19 1700 10/16/19 1730 10/16/19 1830  BP: (!) 153/94 123/78 111/71 132/78  Pulse:  66 77 73  Resp: 16 (!) 21 19 (!) 27  Temp:      SpO2: 100% 100% 100% 100%  Weight:        Height:       GEN: poor hygiene, NAD, refuses to answer my questions ENMT:has mask on Neck: normal Respiratory: clear to auscultation bilaterally, no wheezing, no crackles. Normal respiratory effort. No accessory muscle use.  Cardiovascular: Regular rate and rhythm, no murmurs / rubs / gallops. Abdomen: soft, nt/nd, +bs Musculoskeletal: no edema.  Skin: warm, dry Neurologic: awake, but unable to assess as pt does not follow my commands or answer me.  Psychiatric:unable to assess.   Labs on Admission: I have personally reviewed following labs and imaging studies  CBC: Recent Labs  Lab 10/16/19 1420  WBC 4.9  HGB 15.2  HCT 48.1  MCV 88.9  PLT 117*   Basic Metabolic Panel: Recent Labs  Lab 10/16/19 1420  NA 138  K 4.5  CL 101  CO2 19*  GLUCOSE 184*  BUN 36*  CREATININE 1.62*  CALCIUM 8.9   GFR: Estimated Creatinine Clearance: 38.8 mL/min (A) (by C-G formula based on SCr of 1.62 mg/dL (H)). Liver Function Tests: Recent Labs  Lab 10/16/19 1420  AST 40  ALT 16  ALKPHOS 55  BILITOT 1.3*  PROT 7.1  ALBUMIN 3.4*   Recent Labs  Lab 10/16/19 1420  LIPASE 38   No results for input(s): AMMONIA in the last 168 hours. Coagulation Profile: Recent Labs  Lab 10/16/19 1420  INR 1.2   Cardiac Enzymes: Recent Labs  Lab 10/16/19 1420  CKTOTAL 374   BNP (last 3 results) No results for input(s): PROBNP in the last 8760 hours. HbA1C: No results for input(s): HGBA1C in the last 72 hours. CBG: No results for input(s): GLUCAP in the last 168 hours. Lipid Profile: No results for input(s): CHOL, HDL, LDLCALC, TRIG, CHOLHDL, LDLDIRECT in the last 72 hours. Thyroid Function Tests: No results for input(s): TSH, T4TOTAL, FREET4, T3FREE, THYROIDAB in the last 72 hours. Anemia Panel: No results for input(s): VITAMINB12, FOLATE, FERRITIN, TIBC, IRON, RETICCTPCT in the last 72 hours. Urine analysis:    Component Value Date/Time   COLORURINE YELLOW (A) 12/19/2017 1005    APPEARANCEUR CLEAR (A) 12/19/2017 1005   LABSPEC 1.015 12/19/2017 1005   PHURINE 6.0 12/19/2017 1005   GLUCOSEU >=500 (A) 12/19/2017 1005   HGBUR NEGATIVE 12/19/2017 1005   BILIRUBINUR NEGATIVE 12/19/2017 1005   KETONESUR NEGATIVE 12/19/2017 1005   PROTEINUR 100 (A) 12/19/2017 1005   NITRITE NEGATIVE 12/19/2017 1005   LEUKOCYTESUR NEGATIVE 12/19/2017 1005    Radiological Exams on Admission: CT Head Wo Contrast  Result Date: 10/16/2019 CLINICAL DATA:  Seizure, COVID positive EXAM: CT HEAD WITHOUT CONTRAST TECHNIQUE: Contiguous axial  images were obtained from the base of the skull through the vertex without intravenous contrast. COMPARISON:  2019 FINDINGS: Brain: There is no acute intracranial hemorrhage, mass effect, or edema. No new loss of gray-white differentiation. Left greater than right frontal and left parietal encephalomalacia. Small chronic left thalamic infarct. Additional patchy hypoattenuation in the supratentorial white matter likely reflects similar chronic microvascular ischemic changes. Prominence of the ventricles and sulci reflects similar parenchymal volume loss. There is no extra-axial fluid collection. Vascular: There is atherosclerotic calcification at the skull base. Skull: Calvarium is unremarkable. Sinuses/Orbits: No acute finding. Other: Large occipital scalp lipoma. IMPRESSION: No acute intracranial abnormality. Stable chronic findings detailed above. Electronically Signed   By: Guadlupe Spanish M.D.   On: 10/16/2019 15:13   DG Chest Portable 1 View  Result Date: 10/16/2019 CLINICAL DATA:  Seizure EXAM: PORTABLE CHEST 1 VIEW COMPARISON:  06/14/2015 FINDINGS: Cardiomegaly. Atherosclerotic calcification of the aortic knob. Prominent bibasilar interstitial markings. No pleural effusion or pneumothorax. No acute osseous findings. IMPRESSION: Cardiomegaly with prominent bibasilar interstitial markings, which may reflect mild interstitial edema versus atypical/viral infection.  Electronically Signed   By: Duanne Guess D.O.   On: 10/16/2019 14:57    EKG: Independently reviewed. Afib, nonspecific t changes  Assessment/Plan Active Problems:   Seizure (HCC)    1. Seizure ds- witnessed in ER. Given Phenytoin. Daughter unable to tell me if takes meds for this, but states has seizure history and usually comes on when he is sick Will consult pharmacy for continuation of phenytoin. Seizure precautions Aspiration precautions Neuro checks Consult neurology in am.  2.. sars-covid + Wife was sick about a week ago. Hemodynamically stable, RA 02 sat 100%. He meets Criteria for monoclonal abx based on CKD , age, and diabetes. Unable to get consent from the patient since she is not answering my question or participating much with the physical exam I spoke to the daughter about monoclonal antibodies infusion discussed the side effects including a hypersensitivity reaction.  After she had a discussion with her family she agreed to initiation of monoclonal antibody infusion.  She verbalizes understanding about all the side effects. airborn and contact precautions.  3. A/CKD-stage III. Likely 2/2 due to decrease po intake and being on lasix at home, although not sure if took it. Will give gentle hydration ivf 13ml /hr since with covid. Do not want to over agressively tx. With fluids  4. IDDM- on insulin at home. Not eating or drinking.  Will hold insulin. Ck fs RISS Ck HA1c  5. Persistent afib- on coumadin. With hx/o LAA clot. inr subtherapeutic on admission. Unsure of coumadin dosing. Pharmacy consult for med rec reconciliation. Use lovenox dvt sq for now, resume coumadin once dose confirmed.  6. H/xo cocaine use- urine toxicology done in ED, will need to f/u.     DVT prophylaxis: lovenox  Code Status: DNR-confirmed with daughter via phone  Family Communication: daughter  Disposition Plan: d/c back to home  Consults called: none. Needs neurology in am    Admission status: observation. As pt will require <2 MN stays for his current medical problems.    Lynn Ito MD [facility]   If 7PM-7AM, please contact night-coverage [email or web address] Password [email or web address]  10/16/2019, 6:50 PM

## 2019-10-16 NOTE — Consult Note (Addendum)
MEDICATION RELATED CONSULT NOTE  Pharmacy Consult for Phenytoin Indication: Seizures  No Known Allergies  Patient Measurements: Height: 5\' 6"  (167.6 cm) Weight: 67 kg (147 lb 11.3 oz) IBW/kg (Calculated) : 63.8  Labs: Recent Labs    10/16/19 1420  WBC 4.9  HGB 15.2  HCT 48.1  PLT 117*  CREATININE 1.62*  ALBUMIN 3.4*  PROT 7.1  AST 40  ALT 16  ALKPHOS 55  BILITOT 1.3*   Estimated Creatinine Clearance: 38.8 mL/min (A) (by C-G formula based on SCr of 1.62 mg/dL (H)).  Medical History: Past Medical History:  Diagnosis Date  . Diabetes mellitus without complication (HCC)   . Hypertension   . Seizures (HCC)   . Stroke [facility])     Medications:  Medication reconciliation is pending follow-up on 10/17/19. Patient does not appear to be on any AED prior to admission but awaiting confirmation.  Assessment: Patient is a 69 y/o M with medical history significant for Afib on warfarin, LA thrombus, cocaine abuse, diabetes, hypertension, seizures, and stroke who presented to the ED 9/1 with failure to thrive. Patient subsequently had a witnessed seizure in the ED. Patient received phenytoin loading dose in the ED. Neurology consulted. Pharmacy has been consulted to continue phenytoin orally.  Last albumin = 3.4  Plan:  --Will order phenytoin ER 100 mg q8h (4.5 mg/kg/day)  --Consider checking levels in 2-3 days if therapy continues   11/1 10/16/2019,10:26 PM

## 2019-10-16 NOTE — ED Notes (Signed)
Per RN Nickola Major pt had seizure prior to being roomed in ED 24, she stated seizure lasted approximately 1 minute.

## 2019-10-16 NOTE — ED Notes (Signed)
Updated pt's daughter Olivia Mackie

## 2019-10-16 NOTE — ED Notes (Signed)
Updated pt's daughter on phone. Per pt's daughter this pt has memory impairment. Pt has been able to answer yes or no questions, but does not verbalize w/ complete sentences or multiple words.

## 2019-10-16 NOTE — Consult Note (Signed)
Brief Pharmacy Note  Pharmacy consulted for drug interaction and monitoring of AED  No significant DDI identified between phenytoin and other currently ordered medications. However, if patient is still taking warfarin and therapy is continued, phenytoin can result in a decreased anticoagulant effect. This can be managed by through regular INR monitoring and warfarin dose adjustment.   Lee Gonzalez 10/16/19

## 2019-10-16 NOTE — ED Notes (Signed)
Per pt's daughter Olivia Mackie, pt has not been eating or drinking for the past 2 weeks. Pt had syncopal episode w/ fall this past Sunday. Pt's daughter stated her brother and mother have been in hospital w/ covid and she suspects father is sick w/ covid as well.

## 2019-10-17 ENCOUNTER — Inpatient Hospital Stay: Payer: Medicare Other

## 2019-10-17 ENCOUNTER — Other Ambulatory Visit: Payer: Self-pay

## 2019-10-17 DIAGNOSIS — E1122 Type 2 diabetes mellitus with diabetic chronic kidney disease: Secondary | ICD-10-CM | POA: Diagnosis present

## 2019-10-17 DIAGNOSIS — G9341 Metabolic encephalopathy: Secondary | ICD-10-CM | POA: Diagnosis present

## 2019-10-17 DIAGNOSIS — R569 Unspecified convulsions: Secondary | ICD-10-CM | POA: Diagnosis not present

## 2019-10-17 DIAGNOSIS — Z79899 Other long term (current) drug therapy: Secondary | ICD-10-CM | POA: Diagnosis not present

## 2019-10-17 DIAGNOSIS — Z515 Encounter for palliative care: Secondary | ICD-10-CM | POA: Diagnosis not present

## 2019-10-17 DIAGNOSIS — I6389 Other cerebral infarction: Secondary | ICD-10-CM | POA: Diagnosis not present

## 2019-10-17 DIAGNOSIS — N179 Acute kidney failure, unspecified: Secondary | ICD-10-CM

## 2019-10-17 DIAGNOSIS — I480 Paroxysmal atrial fibrillation: Secondary | ICD-10-CM

## 2019-10-17 DIAGNOSIS — I4821 Permanent atrial fibrillation: Secondary | ICD-10-CM | POA: Diagnosis present

## 2019-10-17 DIAGNOSIS — Z66 Do not resuscitate: Secondary | ICD-10-CM | POA: Diagnosis present

## 2019-10-17 DIAGNOSIS — E860 Dehydration: Secondary | ICD-10-CM

## 2019-10-17 DIAGNOSIS — Z833 Family history of diabetes mellitus: Secondary | ICD-10-CM | POA: Diagnosis not present

## 2019-10-17 DIAGNOSIS — R627 Adult failure to thrive: Secondary | ICD-10-CM | POA: Diagnosis present

## 2019-10-17 DIAGNOSIS — Z7901 Long term (current) use of anticoagulants: Secondary | ICD-10-CM | POA: Diagnosis not present

## 2019-10-17 DIAGNOSIS — Z794 Long term (current) use of insulin: Secondary | ICD-10-CM

## 2019-10-17 DIAGNOSIS — R29705 NIHSS score 5: Secondary | ICD-10-CM | POA: Diagnosis not present

## 2019-10-17 DIAGNOSIS — G40909 Epilepsy, unspecified, not intractable, without status epilepticus: Secondary | ICD-10-CM | POA: Diagnosis present

## 2019-10-17 DIAGNOSIS — I513 Intracardiac thrombosis, not elsewhere classified: Secondary | ICD-10-CM

## 2019-10-17 DIAGNOSIS — R55 Syncope and collapse: Secondary | ICD-10-CM | POA: Diagnosis present

## 2019-10-17 DIAGNOSIS — R931 Abnormal findings on diagnostic imaging of heart and coronary circulation: Secondary | ICD-10-CM | POA: Diagnosis not present

## 2019-10-17 DIAGNOSIS — Z87891 Personal history of nicotine dependence: Secondary | ICD-10-CM | POA: Diagnosis not present

## 2019-10-17 DIAGNOSIS — E119 Type 2 diabetes mellitus without complications: Secondary | ICD-10-CM | POA: Diagnosis not present

## 2019-10-17 DIAGNOSIS — I639 Cerebral infarction, unspecified: Secondary | ICD-10-CM | POA: Diagnosis not present

## 2019-10-17 DIAGNOSIS — F1410 Cocaine abuse, uncomplicated: Secondary | ICD-10-CM | POA: Diagnosis present

## 2019-10-17 DIAGNOSIS — D65 Disseminated intravascular coagulation [defibrination syndrome]: Secondary | ICD-10-CM | POA: Diagnosis not present

## 2019-10-17 DIAGNOSIS — E1165 Type 2 diabetes mellitus with hyperglycemia: Secondary | ICD-10-CM

## 2019-10-17 DIAGNOSIS — Z23 Encounter for immunization: Secondary | ICD-10-CM | POA: Diagnosis not present

## 2019-10-17 DIAGNOSIS — U071 COVID-19: Secondary | ICD-10-CM | POA: Diagnosis present

## 2019-10-17 DIAGNOSIS — N183 Chronic kidney disease, stage 3 unspecified: Secondary | ICD-10-CM | POA: Diagnosis present

## 2019-10-17 DIAGNOSIS — D689 Coagulation defect, unspecified: Secondary | ICD-10-CM | POA: Diagnosis present

## 2019-10-17 DIAGNOSIS — Z7189 Other specified counseling: Secondary | ICD-10-CM | POA: Diagnosis not present

## 2019-10-17 DIAGNOSIS — Z8673 Personal history of transient ischemic attack (TIA), and cerebral infarction without residual deficits: Secondary | ICD-10-CM | POA: Diagnosis not present

## 2019-10-17 LAB — COMPREHENSIVE METABOLIC PANEL
ALT: 17 U/L (ref 0–44)
AST: 38 U/L (ref 15–41)
Albumin: 3.5 g/dL (ref 3.5–5.0)
Alkaline Phosphatase: 55 U/L (ref 38–126)
Anion gap: 11 (ref 5–15)
BUN: 28 mg/dL — ABNORMAL HIGH (ref 8–23)
CO2: 26 mmol/L (ref 22–32)
Calcium: 8.8 mg/dL — ABNORMAL LOW (ref 8.9–10.3)
Chloride: 102 mmol/L (ref 98–111)
Creatinine, Ser: 1.44 mg/dL — ABNORMAL HIGH (ref 0.61–1.24)
GFR calc Af Amer: 57 mL/min — ABNORMAL LOW (ref >60)
GFR calc non Af Amer: 49 mL/min — ABNORMAL LOW (ref >60)
Glucose, Bld: 157 mg/dL — ABNORMAL HIGH (ref 70–99)
Potassium: 4 mmol/L (ref 3.5–5.1)
Sodium: 139 mmol/L (ref 135–145)
Total Bilirubin: 1.2 mg/dL (ref 0.3–1.2)
Total Protein: 7.3 g/dL (ref 6.5–8.1)

## 2019-10-17 LAB — CBC WITH DIFFERENTIAL/PLATELET
Abs Immature Granulocytes: 0.02 10*3/uL (ref 0.00–0.07)
Basophils Absolute: 0 10*3/uL (ref 0.0–0.1)
Basophils Relative: 0 %
Eosinophils Absolute: 0 10*3/uL (ref 0.0–0.5)
Eosinophils Relative: 0 %
HCT: 47.5 % (ref 39.0–52.0)
Hemoglobin: 15.5 g/dL (ref 13.0–17.0)
Immature Granulocytes: 0 %
Lymphocytes Relative: 6 %
Lymphs Abs: 0.3 10*3/uL — ABNORMAL LOW (ref 0.7–4.0)
MCH: 28.7 pg (ref 26.0–34.0)
MCHC: 32.6 g/dL (ref 30.0–36.0)
MCV: 88 fL (ref 80.0–100.0)
Monocytes Absolute: 0.4 10*3/uL (ref 0.1–1.0)
Monocytes Relative: 7 %
Neutro Abs: 4.4 10*3/uL (ref 1.7–7.7)
Neutrophils Relative %: 87 %
Platelets: 117 10*3/uL — ABNORMAL LOW (ref 150–400)
RBC: 5.4 MIL/uL (ref 4.22–5.81)
RDW: 13.6 % (ref 11.5–15.5)
WBC: 5.1 10*3/uL (ref 4.0–10.5)
nRBC: 0 % (ref 0.0–0.2)

## 2019-10-17 LAB — URINALYSIS, COMPLETE (UACMP) WITH MICROSCOPIC
Bilirubin Urine: NEGATIVE
Glucose, UA: 50 mg/dL — AB
Hgb urine dipstick: NEGATIVE
Ketones, ur: 5 mg/dL — AB
Leukocytes,Ua: NEGATIVE
Nitrite: NEGATIVE
Protein, ur: >=300 mg/dL — AB
Specific Gravity, Urine: 1.022 (ref 1.005–1.030)
pH: 5 (ref 5.0–8.0)

## 2019-10-17 LAB — PHOSPHORUS: Phosphorus: 1.8 mg/dL — ABNORMAL LOW (ref 2.5–4.6)

## 2019-10-17 LAB — GLUCOSE, CAPILLARY
Glucose-Capillary: 126 mg/dL — ABNORMAL HIGH (ref 70–99)
Glucose-Capillary: 46 mg/dL — ABNORMAL LOW (ref 70–99)
Glucose-Capillary: 49 mg/dL — ABNORMAL LOW (ref 70–99)
Glucose-Capillary: 52 mg/dL — ABNORMAL LOW (ref 70–99)
Glucose-Capillary: 221 mg/dL — ABNORMAL HIGH (ref 70–99)
Glucose-Capillary: 83 mg/dL (ref 70–99)
Glucose-Capillary: 73 mg/dL (ref 70–99)

## 2019-10-17 LAB — LACTATE DEHYDROGENASE: LDH: 295 U/L — ABNORMAL HIGH (ref 98–192)

## 2019-10-17 LAB — FERRITIN: Ferritin: 464 ng/mL — ABNORMAL HIGH (ref 24–336)

## 2019-10-17 LAB — PROTIME-INR
INR: 1.1 (ref 0.8–1.2)
Prothrombin Time: 13.3 seconds (ref 11.4–15.2)

## 2019-10-17 LAB — C-REACTIVE PROTEIN: CRP: 7.6 mg/dL — ABNORMAL HIGH (ref <1.0)

## 2019-10-17 LAB — HIV ANTIBODY (ROUTINE TESTING W REFLEX): HIV Screen 4th Generation wRfx: NONREACTIVE

## 2019-10-17 LAB — HEMOGLOBIN A1C
Hgb A1c MFr Bld: 8 % — ABNORMAL HIGH (ref 4.8–5.6)
Mean Plasma Glucose: 182.9 mg/dL

## 2019-10-17 LAB — MAGNESIUM: Magnesium: 2.1 mg/dL (ref 1.7–2.4)

## 2019-10-17 LAB — BRAIN NATRIURETIC PEPTIDE: B Natriuretic Peptide: 200.4 pg/mL — ABNORMAL HIGH (ref 0.0–100.0)

## 2019-10-17 LAB — ABO/RH: ABO/RH(D): O POS

## 2019-10-17 LAB — FIBRIN DERIVATIVES D-DIMER (ARMC ONLY): Fibrin derivatives D-dimer (ARMC): >10000 ng/mL (FEU) — ABNORMAL HIGH (ref 0.00–499.00)

## 2019-10-17 LAB — PROCALCITONIN: Procalcitonin: 0.71 ng/mL

## 2019-10-17 MED ORDER — METOPROLOL TARTRATE 25 MG PO TABS
25.0000 mg | ORAL_TABLET | Freq: Two times a day (BID) | ORAL | Status: DC
  Administered 2019-10-17: 09:00:00 25 mg via ORAL
  Filled 2019-10-17 (×3): qty 1

## 2019-10-17 MED ORDER — ENOXAPARIN SODIUM 80 MG/0.8ML ~~LOC~~ SOLN
1.0000 mg/kg | Freq: Two times a day (BID) | SUBCUTANEOUS | Status: DC
  Administered 2019-10-17 – 2019-10-22 (×11): 67.5 mg via SUBCUTANEOUS
  Filled 2019-10-17 (×12): qty 0.8

## 2019-10-17 MED ORDER — DEXTROSE 50 % IV SOLN
INTRAVENOUS | Status: AC
  Filled 2019-10-17: qty 50

## 2019-10-17 MED ORDER — ZINC SULFATE 220 (50 ZN) MG PO CAPS
220.0000 mg | ORAL_CAPSULE | Freq: Every day | ORAL | Status: DC
  Administered 2019-10-17 – 2019-10-22 (×4): 220 mg via ORAL
  Filled 2019-10-17 (×5): qty 1

## 2019-10-17 MED ORDER — LORATADINE 10 MG PO TABS
10.0000 mg | ORAL_TABLET | Freq: Every day | ORAL | Status: DC
  Administered 2019-10-17 – 2019-10-22 (×4): 10 mg via ORAL
  Filled 2019-10-17 (×5): qty 1

## 2019-10-17 MED ORDER — ADULT MULTIVITAMIN W/MINERALS CH
1.0000 | ORAL_TABLET | Freq: Every day | ORAL | Status: DC
  Administered 2019-10-17 – 2019-10-22 (×4): 1 via ORAL
  Filled 2019-10-17 (×4): qty 1

## 2019-10-17 MED ORDER — LEVETIRACETAM IN NACL 1000 MG/100ML IV SOLN
1000.0000 mg | Freq: Two times a day (BID) | INTRAVENOUS | Status: DC
  Administered 2019-10-17 – 2019-10-22 (×11): 1000 mg via INTRAVENOUS
  Filled 2019-10-17 (×13): qty 100

## 2019-10-17 MED ORDER — ENOXAPARIN SODIUM 40 MG/0.4ML ~~LOC~~ SOLN: 40.0000 mg | SUBCUTANEOUS | Status: DC

## 2019-10-17 MED ORDER — THIAMINE HCL 100 MG PO TABS
100.0000 mg | ORAL_TABLET | Freq: Every day | ORAL | Status: DC
  Administered 2019-10-17 – 2019-10-22 (×4): 100 mg via ORAL
  Filled 2019-10-17 (×4): qty 1

## 2019-10-17 MED ORDER — WARFARIN SODIUM 7.5 MG PO TABS
7.5000 mg | ORAL_TABLET | Freq: Once | ORAL | Status: AC
  Administered 2019-10-17: 7.5 mg via ORAL
  Filled 2019-10-17: qty 1

## 2019-10-17 MED ORDER — ASCORBIC ACID 500 MG PO TABS
500.0000 mg | ORAL_TABLET | Freq: Every day | ORAL | Status: DC
  Administered 2019-10-17 – 2019-10-22 (×4): 500 mg via ORAL
  Filled 2019-10-17 (×5): qty 1

## 2019-10-17 MED ORDER — ATORVASTATIN CALCIUM 20 MG PO TABS
40.0000 mg | ORAL_TABLET | Freq: Every day | ORAL | Status: DC
  Administered 2019-10-17 – 2019-10-22 (×4): 40 mg via ORAL
  Filled 2019-10-17 (×5): qty 2

## 2019-10-17 MED ORDER — GLUCAGON HCL RDNA (DIAGNOSTIC) 1 MG IJ SOLR
INTRAMUSCULAR | Status: AC
  Filled 2019-10-17: qty 1

## 2019-10-17 MED ORDER — FOLIC ACID 1 MG PO TABS
1.0000 mg | ORAL_TABLET | Freq: Every day | ORAL | Status: DC
  Administered 2019-10-17 – 2019-10-22 (×4): 1 mg via ORAL
  Filled 2019-10-17 (×5): qty 1

## 2019-10-17 MED ORDER — METOPROLOL TARTRATE 5 MG/5ML IV SOLN
5.0000 mg | Freq: Four times a day (QID) | INTRAVENOUS | Status: DC | PRN
  Administered 2019-10-17 – 2019-10-19 (×3): 5 mg via INTRAVENOUS
  Filled 2019-10-17 (×3): qty 5

## 2019-10-17 MED ORDER — WARFARIN - PHARMACIST DOSING INPATIENT
Freq: Every day | Status: DC
  Filled 2019-10-17: qty 1

## 2019-10-17 NOTE — Progress Notes (Signed)
Update given to family member [employer]. [phone]

## 2019-10-17 NOTE — Consult Note (Addendum)
ANTICOAGULATION CONSULT NOTE - Initial Consult  Pharmacy Consult for Warfarin Indication: atrial fibrillation/LAA clot  No Known Allergies  Patient Measurements: Height: 5\' 6"  (167.6 cm) Weight: 67 kg (147 lb 11.3 oz) IBW/kg (Calculated) : 63.8  Vital Signs: Temp: 99.6 F (37.6 C) (09/02 0734) Temp Source: Oral (09/02 0734) BP: 154/96 (09/02 0734) Pulse Rate: 87 (09/02 0734)  Labs: Recent Labs    10/16/19 1420  HGB 15.2  HCT 48.1  PLT 117*  LABPROT 14.5  INR 1.2  CREATININE 1.62*  CKTOTAL 374    Estimated Creatinine Clearance: 38.8 mL/min (A) (by C-G formula based on SCr of 1.62 mg/dL (H)).   Medical History: Past Medical History:  Diagnosis Date  . Diabetes mellitus without complication (HCC)   . Hypertension   . Seizures (HCC)   . Stroke [facility])     Medications:  Pt reported home dose of warfarin is 7.5mg  qd, with last dosing currently unknown with an admission INR of 1.2.  Pt has started on phenytoin for seizures since admission, which can enhance anticoagulant effect of warfarin.  Assessment: 69 yo male with hx of coagulation disorder, hx of CVA, permanent a fib, hx of LA thrombus and cocaine use with recent seizures (including in ER triage) and new COVID infection.  Pharmacy has been consulted to resume and monitor home warfarin therapy.  Pt is currently in a fib.  Goal of Therapy:  INR 2-3 Monitor platelets by anticoagulation protocol: Yes   Plan:  Will continue enoxaparin (until warfarin therapeutic) and resume pt home dose of warfarin 7.5mg  for now, but given pt new start of phenytoin, will likely have to decrease this dose once pt approaches therapeutic INR per interaction listed above.    Pharmacy will monitor INR and CBC daily, adjusting dose based on levels.  78, PharmD, BCPS Clinical Pharmacist 10/17/2019 8:22 AM

## 2019-10-17 NOTE — Procedures (Addendum)
Patient Name: Lee Gonzalez  MRN: [id number]  Epilepsy Attending: Charlsie Quest  Referring Physician/Provider: Dr. Georgiana Spinner Aroor Date: 10/17/2018 Duration: 23.08 minutes  Patient history: 69 year old male with history of seizures not on any AEDs presented with generalized tonic-clonic seizure.  EEG to evaluate for seizures.  Level of alertness: lethargic  AEDs during EEG study: Keppra  Technical aspects: This EEG study was done with scalp electrodes positioned according to the 10-20 International system of electrode placement. Electrical activity was acquired at a sampling rate of 500Hz  and reviewed with a high frequency filter of 70Hz  and a low frequency filter of 1Hz . EEG data were recorded continuously and digitally stored.   Description: No posterior dominant rhythm was seen.  EEG showed continuous generalized 3-5 theta-delta slowing.  Hyperventilation and photic stimulation were not performed.     ABNORMALITY -Continuous slow, generalized  IMPRESSION: This study is suggestive of moderate to severe diffuse encephalopathy, nonspecific to etiology.  No seizures or epileptiform discharges were seen throughout the recording.  [name] 

## 2019-10-17 NOTE — Progress Notes (Signed)
PROGRESS NOTE    Lee Gonzalez  EAV:[id number] DOB: [birth year] DOA: 10/16/2019 PCP: [facility]   Chief Complaint  Patient presents with  . Seizures    Brief Narrative:  HPI per Dr. Cleon Gustin is a 69 y.o. male with medical history significant of seizure disorder, diabetes mellitus, hypertension, , afib on coumadin, LA thrombus, stroke and cocaine abuse was brought in by daughter who reports about 5 days ago she received a call from her mother that stated patient had passed out.  The daughter told the mother to call EMS but they never did.  Per daughter patient had not been eating or drinking much and in bed.  Sleeping a lot.  While in the ER patient started seizing in the triage room and per nursing it lasted about 1 minute and he was postictal.  Patient then was responding to verbal stimuli at that time.  Unfortunately patient was not very interactive with physical exam for me refused to answer my questions.  Please note information was taken from ER note and also I called the daughter about patient's status.   ED Course:  In the ER he was given phenytoin.  He was also CT of the head that was negative for acute intracranial abnormality.  He was found with Covid positive test.  Apparently patient lives with his wife who was diagnosed and admitted to [facility] with coronavirus about a week ago.  He does have history of cocaine use and his urine toxicology is pending.  His creatinine is 1.62 from baseline of 1.23.  Glucose 184.  BNP 283.7, CK total 374.  Pt will be admitted for witnessed seizure.  Assessment & Plan:   Active Problems:   Thrombus of left atrial appendage   Seizure (HCC)   AKI (acute kidney injury) (HCC)   Dehydration   Acute metabolic encephalopathy   Fibrinolysis (HCC)   Insulin-requiring or dependent type II diabetes mellitus (HCC)   Failure to thrive in adult   Hypophosphatemia   AF (paroxysmal atrial  fibrillation) (HCC)   COVID-19 virus infection  1 seizure disorder/breakthrough seizure Patient presented with decreased oral intake, syncopal episode at home several days prior to admission, increased sleepiness and noted to have a witnessed seizure in the ED lasting about a minute and noted to be postictal at that time.  Head CT done negative for any acute intracranial abnormalities.  COVID-19 PCR positive.  Patient with history of seizure disorder and per admitting physician daughter stated usually develops seizures when he is sick.  Patient currently on seizure precautions, aspiration precautions.  Patient loaded with phenytoin and phenytoin ordered per pharmacy.  UDS positive for cocaine.  Will likely need a phenytoin level checked.  Consulted neurology this morning and per their note patient not on AED epileptic drugs at home.  Neurology recommending routine EEG, MRI of the brain with and without contrast, treatment of COVID-19 infection, changing from Dilantin to Keppra 1 g twice daily.  Seizure precautions.  Neuro checks.  Appreciate neurology input and recommendations.  2.  Acute metabolic encephalopathy Patient noted with some confusion however difficult to ascertain as patient in the airborne room which is pretty loud.  Concern that acute metabolic encephalopathy may be secondary to problem #1.  Patient noted to have a history of A. fib, LAA thrombus on Coumadin noted on admission to have a subtherapeutic INR of 1.2.  UDS on admission noted to be positive for cocaine.  Head CT done  negative.  MRI brain ordered per neurology as well as EEG.  Check a UA with cultures and sensitivities.  Supportive care.  Follow.  3.  COVID-19 infection Patient wife noted to be diagnosed with COVID-19 approximately a week ago and admitted to [facility].  Chest x-ray done with cardiomegaly with prominent bibasilar interstitial markings which may reflect mild interstitial edema versus atypical viral  infection.  LDH this morning was 295, ferritin at 464, procalcitonin 0.71, fibrin derivatives > 10,000.  Patient not hypoxic sats of 98% on room air.  Status post REGEN-COV x 1.  Supportive care.  If patient becomes hypoxic, with worsening fibrinogen derivatives despite full dose Lovenox will place on IV Solu-Medrol and IV remdesivir.  Follow inflammatory markers.  Follow.  4.  Elevated fibrin derivatives Fibrin derivatives > 10,000.  Patient with history of A. fib, LAA thrombus on Coumadin however on presentation INR subtherapeutic.  Coumadin likely poorly controlled.  Check lower extremity Dopplers, trend fibrin derivatives, placed on full dose Lovenox.  If fibrin derivatives continue to trend up and patient becomes hypoxic will place on IV Solu-Medrol and IV remdesivir.  Follow.  5.  History of A. fib with LAA thrombus INR subtherapeutic.  Patient on Coumadin.  Coumadin poorly managed.  Placed on full dose Lovenox.  May need to change anticoagulation from Coumadin to a DOAC on discharge due to poorly managed Coumadin.  Follow.  6.  Hypophosphatemia Replete.  7.  Acute kidney injury on chronic kidney disease stage III Likely secondary to prerenal azotemia secondary to poor oral intake in the presence of diuretics.  Diuretics on hold.  Patient being gently hydrated.  Renal function improving.  Follow.  Continue to hold diuretics.  8.  History of cocaine use/polysubstance abuse UDS positive for cocaine.  Polysubstance cessation.  TOC consulted.  9.  Insulin-dependent diabetes mellitus Patient with poor oral intake.  Hemoglobin A1c 8.0.  CBG noted to be 49 this morning and improving.  Sliding scale insulin.  10.  Failure to thrive Patient noted to have poor oral intake prior to admission.  Patient with no oral intake per RN.  Consulted palliative care for goals of care.    DVT prophylaxis: Lovenox Code Status: DNR Family Communication: Updated patient.  No family at bedside. Disposition:     Status is: Observation    Dispo: The patient is from: Home              Anticipated d/c is to: To be determined.  Home with home health versus SNF.              Anticipated d/c date is: Hopefully in 2 to 3 days.              Patient currently being treated for COVID-19 infection, neurology evaluating for seizures.  Patient with noted poor oral intake.  Not stable for discharge.       Consultants:   Neurology: Dr.Aroor 10/17/2019  Palliative care pending  Procedures:  CT head 10/16/2019  Chest x-ray 10/16/2019  EEG pending 10/17/2019  Antimicrobials:   None   Subjective: Patient laying in bed.  Difficult for patient to hear due to airborne room.  Patient denies any chest pain.  No significant shortness of breath.  No abdominal pain.  No noted further seizure episodes since admission.  Per RN patient with no oral intake.  Objective: Vitals:   10/16/19 2222 10/17/19 0408 10/17/19 0734 10/17/19 1155  BP: (!) 161/83 (!) 152/92 (!) 154/96 Marland Kitchen)  154/81  Pulse: 78 70 87 73  Resp: 16 16 19  (!) 21  Temp: 98.4 F (36.9 C) 98.4 F (36.9 C) 99.6 F (37.6 C) 99.8 F (37.7 C)  TempSrc: Oral  Oral Oral  SpO2: 99% 100% 98% 100%  Weight:      Height:        Intake/Output Summary (Last 24 hours) at 10/17/2019 1608 Last data filed at 10/17/2019 0500 Gross per 24 hour  Intake 968.67 ml  Output --  Net 968.67 ml   Filed Weights   10/16/19 1413 10/16/19 1416  Weight: 67 kg 67 kg    Examination:  General exam: Appears calm and comfortable  Respiratory system: Clear to auscultation. Respiratory effort normal. Cardiovascular system: S1 & S2 heard, RRR. No JVD, murmurs, rubs, gallops or clicks. No pedal edema. Gastrointestinal system: Abdomen is nondistended, soft and nontender. No organomegaly or masses felt. Normal bowel sounds heard. Central nervous system: Alert. No focal neurological deficits.  Moving extremities spontaneously. Extremities: Symmetric 5 x 5 power. Skin: No  rashes, lesions or ulcers Psychiatry: Judgement and insight appear poor to fair. Mood & affect appropriate.     Data Reviewed: I have personally reviewed following labs and imaging studies  CBC: Recent Labs  Lab 10/16/19 1420 10/17/19 1027  WBC 4.9 5.1  NEUTROABS  --  4.4  HGB 15.2 15.5  HCT 48.1 47.5  MCV 88.9 88.0  PLT 117* 117*    Basic Metabolic Panel: Recent Labs  Lab 10/16/19 1420 10/17/19 1027  NA 138 139  K 4.5 4.0  CL 101 102  CO2 19* 26  GLUCOSE 184* 157*  BUN 36* 28*  CREATININE 1.62* 1.44*  CALCIUM 8.9 8.8*  MG  --  2.1  PHOS  --  1.8*    GFR: Estimated Creatinine Clearance: 43.7 mL/min (A) (by C-G formula based on SCr of 1.44 mg/dL (H)).  Liver Function Tests: Recent Labs  Lab 10/16/19 1420 10/17/19 1027  AST 40 38  ALT 16 17  ALKPHOS 55 55  BILITOT 1.3* 1.2  PROT 7.1 7.3  ALBUMIN 3.4* 3.5    CBG: Recent Labs  Lab 10/17/19 0504 10/17/19 0514 10/17/19 0527 10/17/19 0732 10/17/19 1155  GLUCAP 49* 52* 126* 221* 83     Recent Results (from the past 240 hour(s))  SARS Coronavirus 2 by RT PCR (hospital order, performed in [facility] hospital lab) Nasopharyngeal Nasopharyngeal Swab     Status: Abnormal   Collection Time: 10/16/19  3:35 PM   Specimen: Nasopharyngeal Swab  Result Value Ref Range Status   SARS Coronavirus 2 POSITIVE (A) NEGATIVE Final    Comment: RESULT CALLED TO, READ BACK BY AND VERIFIED WITH: MITCH BARKER @1722  10/16/19 MJU (NOTE) SARS-CoV-2 target nucleic acids are DETECTED  SARS-CoV-2 RNA is generally detectable in upper respiratory specimens  during the acute phase of infection.  Positive results are indicative  of the presence of the identified virus, but do not rule out bacterial infection or co-infection with other pathogens not detected by the test.  Clinical correlation with patient history and  other diagnostic information is necessary to determine patient infection status.  The expected result is  negative.  Fact Sheet for Patients:     Fact Sheet for Healthcare Providers:   12/16/19    This test is not yet approved or cleared by the [employer] FDA and  has been authorized for detection and/or diagnosis of SARS-CoV-2 by FDA under an Emergency Use Authorization (EUA).  This  EUA will remain in effect (meaning this test ca n be used) for the duration of  the COVID-19 declaration under Section 564(b)(1) of the Act, 21 U.S.C. section 360-bbb-3(b)(1), unless the authorization is terminated or revoked sooner.  Performed at [facility], [address], [place], [place] [address]          Radiology Studies: EEG  Result Date: 10/17/2019 Lee Quest, MD     10/17/2019  3:22 PM Patient Name: Raynold Blankenbaker MRN: [id number] Epilepsy Attending: Charlsie Gonzalez Referring Physician/Provider: Dr. Georgiana Spinner Aroor Date: 10/17/2018 Duration: 23.08 minutes Patient history: 69 year old male with history of seizures not on any AEDs presented with generalized tonic-clonic seizure.  EEG to evaluate for seizures. Level of alertness: lethargic AEDs during EEG study: Keppra Technical aspects: This EEG study was done with scalp electrodes positioned according to the 10-20 International system of electrode placement. Electrical activity was acquired at a sampling rate of 500Hz  and reviewed with a high frequency filter of 70Hz  and a low frequency filter of 1Hz . EEG data were recorded continuously and digitally stored. Description: No posterior dominant rhythm was seen.  EEG showed continuous generalized 3-5 theta-delta slowing.  Hyperventilation and photic stimulation were not performed.   ABNORMALITY -Continuous slow, generalized IMPRESSION: This study is suggestive of moderate to severe diffuse encephalopathy, nonspecific to etiology.  No seizures or epileptiform discharges were seen throughout the recording.   CT Head Wo Contrast  Result Date: 10/16/2019 CLINICAL DATA:  Seizure, COVID positive EXAM: CT HEAD WITHOUT CONTRAST TECHNIQUE: Contiguous axial images were obtained from the base of the skull through the vertex without intravenous contrast. COMPARISON:  2019 FINDINGS: Brain: There is no acute intracranial hemorrhage, mass effect, or edema. No new loss of gray-white differentiation. Left greater than right frontal and left parietal encephalomalacia. Small chronic left thalamic infarct. Additional patchy hypoattenuation in the supratentorial white matter likely reflects similar chronic microvascular ischemic changes. Prominence of the ventricles and sulci reflects similar parenchymal volume loss. There is no extra-axial fluid collection. Vascular: There is atherosclerotic calcification at the skull base. Skull: Calvarium is unremarkable. Sinuses/Orbits: No acute finding. Other: Large occipital scalp lipoma. IMPRESSION: No acute intracranial abnormality. Stable chronic findings detailed above. Electronically Signed   By: Lee Gonzalez M.D.   On: 10/16/2019 15:13   DG Chest Portable 1 View  Result Date: 10/16/2019 CLINICAL DATA:  Seizure EXAM: PORTABLE CHEST 1 VIEW COMPARISON:  06/14/2015 FINDINGS: Cardiomegaly. Atherosclerotic calcification of the aortic knob. Prominent bibasilar interstitial markings. No pleural effusion or pneumothorax. No acute osseous findings. IMPRESSION: Cardiomegaly with prominent bibasilar interstitial markings, which may reflect mild interstitial edema versus atypical/viral infection. Electronically Signed   By: 12/16/2019 D.O.   On: 10/16/2019 14:57        Scheduled Meds: . vitamin C  500 mg Oral Daily  . atorvastatin  40 mg Oral Daily  . citalopram  10 mg Oral Daily  . docusate sodium  100 mg Oral BID  . [START ON 10/18/2019] enoxaparin (LOVENOX) injection  40 mg Subcutaneous Q24H  . folic acid  1 mg Oral Daily  . glucagon (human recombinant)      .  insulin aspart  0-15 Units Subcutaneous Q4H  . loratadine  10 mg Oral Daily  . metoprolol tartrate  25 mg Oral BID  . multivitamin with minerals  1 tablet Oral Daily  . senna  1 tablet Oral BID  . thiamine  100 mg Oral Daily  . Warfarin -  Pharmacist Dosing Inpatient   Does not apply q1600  . zinc sulfate  220 mg Oral Daily   Continuous Infusions: . sodium chloride 40 mL/hr at 10/16/19 2332  . sodium chloride    . famotidine (PEPCID) IV    . levETIRAcetam 1,000 mg (10/17/19 1323)     LOS: 0 days    Time spent: 35 minutes    Ramiro Harvestaniel Huntley Knoop, MD [facility]   To contact the attending provider between 7A-7P or the covering provider during after hours 7P-7A, please log into the web site [email or web address] and access using universal [place] password for that web site. If you do not have the password, please call the hospital operator.  10/17/2019, 4:08 PM

## 2019-10-17 NOTE — Progress Notes (Signed)
Inpatient Diabetes Program Recommendations  AACE/ADA: New Consensus Statement on Inpatient Glycemic Control  Target Ranges:  Prepandial:   less than 140 mg/dL      Peak postprandial:   less than 180 mg/dL (1-2 hours)      Critically ill patients:  140 - 180 mg/dL   Results for KELLAN, RAFFIELD (MRN [id number]) as of 10/17/2019 09:19  Ref. Range 10/16/2019 19:52 10/16/2019 23:52 10/17/2019 04:43 10/17/2019 05:04 10/17/2019 05:14 10/17/2019 05:27 10/17/2019 07:32  Glucose-Capillary Latest Ref Range: 70 - 99 mg/dL 322 (H)  Novolog 2 units 144 (H)  Novolog 2 units 46 (L) 49 (L) 52 (L) 126 (H) 221 (H)  Elevation likely due to treatment of hypoglycemia   Review of Glycemic Control  Diabetes history: DM2 Outpatient Diabetes medications: Lantus 15 units BID Current orders for Inpatient glycemic control: Novolog 0-15 units Q4H  Inpatient Diabetes Program Recommendations:    Insulin-May want to consider decreasing Novolog correction to sensitive 0-9 units Q4H.  Thanks, Orlando Penner, RN, MSN, CDE Diabetes Coordinator Inpatient Diabetes Program [phone] (Team Pager from 8am to 5pm)

## 2019-10-17 NOTE — Consult Note (Signed)
ANTICOAGULATION CONSULT NOTE - Initial Consult  Pharmacy Consult for Warfarin Indication: atrial fibrillation/LAA clot  No Known Allergies  Patient Measurements: Height: 5\' 6"  (167.6 cm) Weight: 67 kg (147 lb 11.3 oz) IBW/kg (Calculated) : 63.8  Vital Signs: Temp: 99.8 F (37.7 C) (09/02 1155) Temp Source: Oral (09/02 1155) BP: 154/81 (09/02 1155) Pulse Rate: 73 (09/02 1155)  Labs: Recent Labs    10/16/19 1420 10/17/19 1027  HGB 15.2 15.5  HCT 48.1 47.5  PLT 117* 117*  LABPROT 14.5 13.3  INR 1.2 1.1  CREATININE 1.62* 1.44*  CKTOTAL 374  --     Estimated Creatinine Clearance: 43.7 mL/min (A) (by C-G formula based on SCr of 1.44 mg/dL (H)).   Medical History: Past Medical History:  Diagnosis Date  . Diabetes mellitus without complication (HCC)   . Hypertension   . Seizures (HCC)   . Stroke [facility])     Medications:  Pt reported home dose of warfarin is 7.5mg  qd, with last dosing currently unknown with an admission INR of 1.2.  Pt has started on phenytoin for seizures since admission, which can enhance anticoagulant effect of warfarin.  Assessment: 69 yo male with hx of coagulation disorder, hx of CVA, permanent a fib, hx of LA thrombus and cocaine use with recent seizures (including in ER triage) and new COVID infection.  Pharmacy has been consulted to resume and monitor home warfarin therapy w/therapeutic enoxaparin for bridging.   D-Dimer >10,0000   Goal of Therapy:  INR 2-3 Monitor platelets by anticoagulation protocol: Yes   Plan:  WARFARIN: Resume pt home dose of warfarin 7.5mg  for now, but given pt new start of phenytoin, will likely have to decrease this dose once pt approaches therapeutic INR per interaction listed above.    ENOXAPARIN: Initiate therapeutic enoxaparin at 1 mg/kg q12 hours.   Pharmacy will monitor INR and CBC daily, adjusting dose based on levels.  78, PharmD, BCPS Clinical Pharmacist 10/17/2019 4:11 PM

## 2019-10-17 NOTE — Consult Note (Signed)
Requesting Physician: Dr. Janee Morn    Chief Complaint: Seizure  History obtained from: Patient and Chart     HPI:                                                                                                                                       Lee Gonzalez is a 69 y.o. 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  While in the emergency department, patient had a witnessed seizure in triage lasting for about 1 minute.  Was loaded with phenytoin.  COVID-19 was subsequently positive.  UDS was also positive for cocaine.  INR subtherapeutic at 1.1.  CT head unremarkable for acute findings.  Neurology consulted for further management of seizures.  Patient has remote history of seizures usually provoked by illness.  Does not appear to be on any antiepileptic drugs at home.    Past Medical History:  Diagnosis Date  . Diabetes mellitus without complication (HCC)   . Hypertension   . Seizures (HCC)   . Stroke [facility])     Past Surgical History:  Procedure Laterality Date  . none      Family History  Problem Relation Age of Onset  . Diabetes Mellitus II Father   . Cancer Mother    Social History:  reports that he quit smoking about 22 years ago. His smoking use included cigarettes. He has a 5.00 pack-year smoking history. He has never used smokeless tobacco. He reports that he does not drink alcohol and does not use drugs.  Allergies: No Known Allergies  Medications:                                                                                                                        I reviewed home medications.  Does not appear to be on 80s   ROS:  Unable to assess due to patient's mental status   Examination:                                                                                                      General: Appears well-developed and well-nourished.  Psych: Unable to assess Eyes: No scleral injection HENT: No OP obstrucion Head: Normocephalic.  Cardiovascular: Normal rate and regular rhythm.  Respiratory: Effort normal and breath sounds normal to anterior ascultation GI: Soft.  No distension. There is no tenderness.  Skin: WDI    Neurological Examination Mental Status: Lethargic, answers his name only.  Does not answer any other questions.  Not following commands. Cranial Nerves: II: Visual fields: Blinks to threat bilaterally III,IV, VI: ptosis not present, extra-ocular motions intact bilaterally, pupils equal, round, reactive to light and accommodation VII: smile symmetric,  VIII: Unable to assess IX,X: uvula rises symmetrically XII: midline tongue extension Motor: Moves all 4 extremities against gravity with good strength and appears symmetric Tone and bulk:normal tone throughout; no atrophy noted Sensory: Pinprick and light touch intact throughout, bilaterally Plantars: Right: downgoing   Left: downgoing Cerebellar: Unable to assess as patient not cooperative Gait: Unable to assess     Lab Results: Basic Metabolic Panel: Recent Labs  Lab 10/16/19 1420 10/17/19 1027  NA 138 139  K 4.5 4.0  CL 101 102  CO2 19* 26  GLUCOSE 184* 157*  BUN 36* 28*  CREATININE 1.62* 1.44*  CALCIUM 8.9 8.8*  MG  --  2.1  PHOS  --  1.8*    CBC: Recent Labs  Lab 10/16/19 1420 10/17/19 1027  WBC 4.9 5.1  NEUTROABS  --  4.4  HGB 15.2 15.5  HCT 48.1 47.5  MCV 88.9 88.0  PLT 117* 117*    Coagulation Studies: Recent Labs    10/16/19 1420 10/17/19 1027  LABPROT 14.5 13.3  INR 1.2 1.1    Imaging: CT Head Wo Contrast  Result Date: 10/16/2019 CLINICAL DATA:   Seizure, COVID positive EXAM: CT HEAD WITHOUT CONTRAST TECHNIQUE: Contiguous axial images were obtained from the base of the skull through the vertex without intravenous contrast. COMPARISON:  2019 FINDINGS: Brain: There is no acute intracranial hemorrhage, mass effect, or edema. No new loss of gray-white differentiation. Left greater than right frontal and left parietal encephalomalacia. Small chronic left thalamic infarct. Additional patchy hypoattenuation in the supratentorial white matter likely reflects similar chronic microvascular ischemic changes. Prominence of the ventricles and sulci reflects similar parenchymal volume loss. There is no extra-axial fluid collection. Vascular: There is atherosclerotic calcification at the skull base. Skull: Calvarium is unremarkable. Sinuses/Orbits: No acute finding. Other: Large occipital scalp lipoma. IMPRESSION: No acute intracranial abnormality. Stable chronic findings detailed above. Electronically Signed   By: Guadlupe Spanish M.D.   On: 10/16/2019 15:13   DG Chest Portable 1 View  Result Date: 10/16/2019 CLINICAL DATA:  Seizure EXAM: PORTABLE CHEST 1 VIEW COMPARISON:  06/14/2015 FINDINGS: Cardiomegaly. Atherosclerotic calcification of the aortic knob. Prominent bibasilar interstitial markings. No pleural effusion or pneumothorax. No acute osseous findings. IMPRESSION: Cardiomegaly with prominent bibasilar  interstitial markings, which may reflect mild interstitial edema versus atypical/viral infection. Electronically Signed   By: Duanne Guess D.O.   On: 10/16/2019 14:57     I have reviewed the above imaging : CT head reviewed, left parietal encephalomalacia   ASSESSMENT AND PLAN  69 year old male with prior CVA due to A. fib and LA thrombus on Coumadin, history of seizures not on any AED epileptic drugs at home presents to the emergency department with cough and fatigue-subsequently had witnessed seizure in the emergency department loaded with phenytoin.   We will switch to Keppra due to phenytoin interaction with warfarin.  Breakthrough seizure in the setting of illness COVID-19 infection Acute metabolic encephalopathy Cocaine abuse History of prior CVA   Recommendations We will switch Dilantin to Keppra due to its interaction warfarin.  We will start Keppra 1 g twice daily Continue warfarin for stroke prophylaxis for atrial fibrillation/history of LAA thrombus Routine EEG: Pending MRI brain without contrast Continue to treat COVID-19 infection per primary Seizure precautions Neurochecks   Per [facility] statutes, patients with seizures are not allowed to drive until they have been seizure-free for six months. Use caution when using heavy equipment or power tools. Avoid working on ladders or at heights. Take showers instead of baths. Ensure the water temperature is not too high on the home water heater. Do not go swimming alone. Do not lock yourself in a room alone (i.e. bathroom). When caring for infants or small children, sit down when holding, feeding, or changing them to minimize risk of injury to the child in the event you have a seizure. Maintain good sleep hygiene. Avoid alcohol.    If Girtha Hake has another seizure, call 911 and bring them back to the ED if:       A.  The seizure lasts longer than 5 minutes.            B.  The patient doesn't wake shortly after the seizure or has new problems such as difficulty seeing, speaking or moving following the seizure       C.  The patient was injured during the seizure       D.  The patient has a temperature over 102 F (39C)       E.  The patient vomited during the seizure and now is having trouble breathing   Shon Mansouri [facility] Pager Number [phone]

## 2019-10-17 NOTE — Progress Notes (Signed)
Requested missing dose of coumadin from pharmacy.

## 2019-10-17 NOTE — Progress Notes (Signed)
eeg done °

## 2019-10-17 NOTE — Progress Notes (Signed)
Pt BG at 0443-46. Pt asymptomatic. Given 4 oz OJ. Rechecked at [phone]. Given additional 4 oz OJ and rechecked at 0514- 52. Given D50 25g IV. Pt rechecked at 0527-126. MD notified. Will continue to monitor.

## 2019-10-18 ENCOUNTER — Inpatient Hospital Stay: Payer: Medicare Other

## 2019-10-18 DIAGNOSIS — I639 Cerebral infarction, unspecified: Secondary | ICD-10-CM | POA: Diagnosis present

## 2019-10-18 DIAGNOSIS — R627 Adult failure to thrive: Secondary | ICD-10-CM

## 2019-10-18 DIAGNOSIS — Z7189 Other specified counseling: Secondary | ICD-10-CM

## 2019-10-18 DIAGNOSIS — Z515 Encounter for palliative care: Secondary | ICD-10-CM

## 2019-10-18 DIAGNOSIS — Z66 Do not resuscitate: Secondary | ICD-10-CM

## 2019-10-18 DIAGNOSIS — U071 COVID-19: Secondary | ICD-10-CM

## 2019-10-18 LAB — GLUCOSE, CAPILLARY
Glucose-Capillary: 144 mg/dL — ABNORMAL HIGH (ref 70–99)
Glucose-Capillary: 180 mg/dL — ABNORMAL HIGH (ref 70–99)
Glucose-Capillary: 167 mg/dL — ABNORMAL HIGH (ref 70–99)
Glucose-Capillary: 121 mg/dL — ABNORMAL HIGH (ref 70–99)
Glucose-Capillary: 109 mg/dL — ABNORMAL HIGH (ref 70–99)

## 2019-10-18 LAB — CBC WITH DIFFERENTIAL/PLATELET
Abs Immature Granulocytes: 0.05 10*3/uL (ref 0.00–0.07)
Basophils Absolute: 0 10*3/uL (ref 0.0–0.1)
Basophils Relative: 0 %
Eosinophils Absolute: 0 10*3/uL (ref 0.0–0.5)
Eosinophils Relative: 0 %
HCT: 52.3 % — ABNORMAL HIGH (ref 39.0–52.0)
Hemoglobin: 16.1 g/dL (ref 13.0–17.0)
Immature Granulocytes: 1 %
Lymphocytes Relative: 6 %
Lymphs Abs: 0.5 10*3/uL — ABNORMAL LOW (ref 0.7–4.0)
MCH: 28.5 pg (ref 26.0–34.0)
MCHC: 30.8 g/dL (ref 30.0–36.0)
MCV: 92.6 fL (ref 80.0–100.0)
Monocytes Absolute: 0.6 10*3/uL (ref 0.1–1.0)
Monocytes Relative: 7 %
Neutro Abs: 7.1 10*3/uL (ref 1.7–7.7)
Neutrophils Relative %: 86 %
Platelets: 113 10*3/uL — ABNORMAL LOW (ref 150–400)
RBC: 5.65 MIL/uL (ref 4.22–5.81)
RDW: 14.1 % (ref 11.5–15.5)
WBC: 8.3 10*3/uL (ref 4.0–10.5)
nRBC: 0 % (ref 0.0–0.2)

## 2019-10-18 LAB — COMPREHENSIVE METABOLIC PANEL
ALT: 14 U/L (ref 0–44)
AST: 38 U/L (ref 15–41)
Albumin: 3.1 g/dL — ABNORMAL LOW (ref 3.5–5.0)
Alkaline Phosphatase: 52 U/L (ref 38–126)
Anion gap: 16 — ABNORMAL HIGH (ref 5–15)
BUN: 24 mg/dL — ABNORMAL HIGH (ref 8–23)
CO2: 20 mmol/L — ABNORMAL LOW (ref 22–32)
Calcium: 8.8 mg/dL — ABNORMAL LOW (ref 8.9–10.3)
Chloride: 103 mmol/L (ref 98–111)
Creatinine, Ser: 1.63 mg/dL — ABNORMAL HIGH (ref 0.61–1.24)
GFR calc Af Amer: 49 mL/min — ABNORMAL LOW (ref >60)
GFR calc non Af Amer: 42 mL/min — ABNORMAL LOW (ref >60)
Glucose, Bld: 161 mg/dL — ABNORMAL HIGH (ref 70–99)
Potassium: 4.7 mmol/L (ref 3.5–5.1)
Sodium: 139 mmol/L (ref 135–145)
Total Bilirubin: 1.4 mg/dL — ABNORMAL HIGH (ref 0.3–1.2)
Total Protein: 6.9 g/dL (ref 6.5–8.1)

## 2019-10-18 LAB — C-REACTIVE PROTEIN: CRP: 15.2 mg/dL — ABNORMAL HIGH (ref <1.0)

## 2019-10-18 LAB — BRAIN NATRIURETIC PEPTIDE: B Natriuretic Peptide: 515 pg/mL — ABNORMAL HIGH (ref 0.0–100.0)

## 2019-10-18 LAB — PHOSPHORUS: Phosphorus: 3.7 mg/dL (ref 2.5–4.6)

## 2019-10-18 LAB — URINE CULTURE

## 2019-10-18 LAB — FIBRIN DERIVATIVES D-DIMER (ARMC ONLY): Fibrin derivatives D-dimer (ARMC): >7500 ng/mL (FEU) — ABNORMAL HIGH (ref 0.00–499.00)

## 2019-10-18 LAB — PROCALCITONIN: Procalcitonin: 1.1 ng/mL

## 2019-10-18 LAB — PROTIME-INR
INR: 1.4 — ABNORMAL HIGH (ref 0.8–1.2)
Prothrombin Time: 16.4 seconds — ABNORMAL HIGH (ref 11.4–15.2)

## 2019-10-18 LAB — FERRITIN: Ferritin: 512 ng/mL — ABNORMAL HIGH (ref 24–336)

## 2019-10-18 LAB — MAGNESIUM: Magnesium: 2 mg/dL (ref 1.7–2.4)

## 2019-10-18 MED ORDER — MORPHINE SULFATE (CONCENTRATE) 10 MG/0.5ML PO SOLN
5.0000 mg | ORAL | Status: DC | PRN
  Administered 2019-10-19: 22:00:00 5 mg via SUBLINGUAL
  Filled 2019-10-18: qty 0.5

## 2019-10-18 MED ORDER — LORAZEPAM 2 MG/ML IJ SOLN
1.0000 mg | Freq: Once | INTRAMUSCULAR | Status: AC
  Administered 2019-10-18: 03:00:00 1 mg via INTRAVENOUS
  Filled 2019-10-18: qty 1

## 2019-10-18 MED ORDER — WARFARIN SODIUM 7.5 MG PO TABS
7.5000 mg | ORAL_TABLET | Freq: Once | ORAL | Status: DC
  Filled 2019-10-18: qty 1

## 2019-10-18 MED ORDER — STROKE: EARLY STAGES OF RECOVERY BOOK: Freq: Once | Status: AC

## 2019-10-18 MED ORDER — MORPHINE SULFATE (PF) 2 MG/ML IV SOLN: 2.0000 mg | INTRAVENOUS | Status: DC | PRN

## 2019-10-18 NOTE — Evaluation (Signed)
Physical Therapy Evaluation Patient Details Name: Lee Gonzalez MRN: [id number] DOB: 1950/02/27 Today's Date: 10/18/2019   History of Present Illness  69 y.o. male with medical history significant of seizure disorder, diabetes mellitus, hypertension, , afib on coumadin, LA thrombus, stroke and cocaine abuse was brought in by daughter who reports he passed out last week and had been minimall eating or interactive since.  Pt rested Covid positive the week prior to admittance.  MRI reveals acute CVA.  Clinical Impression  Pt was unable to keep eyes open for any extended period of time, very inconsistent ability to follow cuing or perform voluntary tasks, though there were glimmers of effort smattered t/o the session.  Pt was able to maintain sitting at EOB w/o direct assist for a few minutes while PT attempted to wake him, gather more information and elicit some AROM - though initially he could not at all keep his balance, leaning to the L  - again not opening eyes or able to meaningfully communicate (grunts/groans for most responses).  Pt is clearly not at his baseline and will need increased level of care when medically ready for d/c, recommending STR.     Follow Up Recommendations SNF    Equipment Recommendations   (per progress)    Recommendations for Other Services       Precautions / Restrictions Precautions Precautions: Fall (bed rest orders) Restrictions Weight Bearing Restrictions: No      Mobility  Bed Mobility Overal bed mobility: Needs Assistance Bed Mobility: Supine to Sit;Sit to Supine     Supine to sit: Max assist Sit to supine: Max assist   General bed mobility comments: Pt gave very little effort in attempt to get to sitting, heavy assist from PT.  Did make effort to encourge/cue some pt assist with minimal functional effort  Transfers                 General transfer comment: not appropriate to attempt standing, pt unable to open eyes, follow  instruction or perform AROM  Ambulation/Gait                Stairs            Wheelchair Mobility    Modified Rankin (Stroke Patients Only)       Balance Overall balance assessment: Needs assistance Sitting-balance support: Bilateral upper extremity supported Sitting balance-Leahy Scale: Poor Sitting balance - Comments: initially very unsteady, leaning L, needing heavy assist.  Once assisted to EOB, cued heavily to use UEs appropriately, etc he was able to maintain seated balance with only rare min assist and constant CGA                                     Pertinent Vitals/Pain Pain Assessment: Faces Faces Pain Scale: Hurts a little bit Pain Location: unable to answer questions about pain, displayed some general discomfort but nothing specific    Home Living Family/patient expects to be discharged to:: Skilled nursing facility                 Additional Comments: pt unable to answer any questions apart from muttering his name.      Prior Function           Comments: Pt unable to answer questions, per notes seems he is at least somewhat ambulatory recently; walked 50 ft with PT during admission 2 years ago.  Hand Dominance        Extremity/Trunk Assessment   Upper Extremity Assessment Upper Extremity Assessment: Generalized weakness (minimal voluntary movement, some ability to AAROM/mimic PROM)    Lower Extremity Assessment Lower Extremity Assessment: Generalized weakness (very limited A/AAROM in LEs, cognition vs weakness?)       Communication   Communication: Expressive difficulties (able to mumble name, but no other cogent communication)  Cognition Arousal/Alertness: Lethargic Behavior During Therapy:  (didn't open eyes despite regular cuing, inconsistent effort) Overall Cognitive Status: Impaired/Different from baseline                                 General Comments: Pt largely incoherent t/o the PT  session, did show some voluntary effort inconsistently with heavy cuing, did attempt to maintain sitting balance after assisted in doing so for a while      General Comments      Exercises Other Exercises Other Exercises: multiple repeated PROM "exercises" in U&LEs with attempts to elict a response   Assessment/Plan    PT Assessment Patient needs continued PT services  PT Problem List Decreased strength;Decreased range of motion;Decreased activity tolerance;Decreased balance;Decreased mobility;Decreased coordination;Decreased cognition;Decreased knowledge of use of DME;Decreased safety awareness;Pain       PT Treatment Interventions DME instruction;Gait training;Functional mobility training;Therapeutic activities;Therapeutic exercise;Balance training;Neuromuscular re-education;Patient/family education    PT Goals (Current goals can be found in the Care Plan section)  Acute Rehab PT Goals Patient Stated Goal: unable to state PT Goal Formulation: Patient unable to participate in goal setting Time For Goal Achievement: 11/01/19 Potential to Achieve Goals: Fair    Frequency Min 2X/week (could increase to QD per mental status)   Barriers to discharge        Co-evaluation               AM-PAC PT "6 Clicks" Mobility  Outcome Measure Help needed turning from your back to your side while in a flat bed without using bedrails?: Total Help needed moving from lying on your back to sitting on the side of a flat bed without using bedrails?: Total Help needed moving to and from a bed to a chair (including a wheelchair)?: Total Help needed standing up from a chair using your arms (e.g., wheelchair or bedside chair)?: Total Help needed to walk in hospital room?: Total Help needed climbing 3-5 steps with a railing? : Total 6 Click Score: 6    End of Session   Activity Tolerance: Patient limited by lethargy Patient left: with bed alarm set;with call bell/phone within reach Nurse  Communication: Mobility status PT Visit Diagnosis: Muscle weakness (generalized) (M62.81);Difficulty in walking, not elsewhere classified (R26.2);Other symptoms and signs involving the nervous system (S97.026)    Time: 3785-8850 PT Time Calculation (min) (ACUTE ONLY): 18 min   Charges:   PT Evaluation $PT Eval Low Complexity: 1 Low          Malachi Pro, DPT 10/18/2019, 1:36 PM

## 2019-10-18 NOTE — Progress Notes (Signed)
Reason for consult: Seizures  Subjective: Patient has not had any further clinical seizures since admission   ROS: negative except above  Examination  Vital signs in last 24 hours: Temp:  [98 F (36.7 C)-99.6 F (37.6 C)] 98.5 F (36.9 C) (09/03 1353) Pulse Rate:  [28-80] 66 (09/03 1353) Resp:  [18-28] 18 (09/03 1353) BP: (152-182)/(86-129) 167/110 (09/03 1353) SpO2:  [93 %-100 %] 93 % (09/03 1353)  General: lying in bed CVS: pulse-normal rate and rhythm RS: breathing comfortably Extremities: normal   Neuro: MS: Alert obly to himslef,  follows commands CN: pupils equal and reactive,  EOMI, face symmetric, tongue midline, normal sensation over face, Motor: moves all 4 extremities equally  plantars: flexor Coordination: normal Gait: not tested  Basic Metabolic Panel: Recent Labs  Lab 10/16/19 1420 10/17/19 1027 10/18/19 0522  NA 138 139 139  K 4.5 4.0 4.7  CL 101 102 103  CO2 19* 26 20*  GLUCOSE 184* 157* 161*  BUN 36* 28* 24*  CREATININE 1.62* 1.44* 1.63*  CALCIUM 8.9 8.8* 8.8*  MG  --  2.1 2.0  PHOS  --  1.8* 3.7    CBC: Recent Labs  Lab 10/16/19 1420 10/17/19 1027 10/18/19 0522  WBC 4.9 5.1 8.3  NEUTROABS  --  4.4 7.1  HGB 15.2 15.5 16.1  HCT 48.1 47.5 52.3*  MCV 88.9 88.0 92.6  PLT 117* 117* 113*     Coagulation Studies: Recent Labs    10/16/19 1420 10/17/19 1027 10/18/19 0522  LABPROT 14.5 13.3 16.4*  INR 1.2 1.1 1.4*    Imaging Reviewed:     ASSESSMENT AND PLAN  69 year old male with prior CVA due to A. fib and LA thrombus on Coumadin, history of seizures not on any AED epileptic drugs at home presents to the emergency department with cough and fatigue-subsequently had witnessed seizure in the emergency department loaded with phenytoin.  We will switch to Keppra due to phenytoin interaction with warfarin.  Seizure likely provoked in setting of cocaine abuse, COVID-19 illness.  MRI brain was performed which showed subacute  infarction, suspect incidental finding likely from warfarin being subtherapeutic versus cocaine induced vasospasm.  Breakthrough seizure in the setting of illness COVID-19 infection Acute metabolic encephalopathy/COVID -19 Encephalopathy  Cocaine abuse Subacute infarction    #Seizures Keppra 1 g twice daily Routine EEG: no epileptiform discahrges Continue to treat COVID-19 infection per primary Seizure precautions Neurochecks   #Subacute left paramedian infarction - Etiology : cocaine induced vasospasm vs embolic in the setting of subtherapeutic INR #Transthoracic Echo  # carotid doppler: performed, results pending # Continue Coumadin #Start or continue Atorvastatin 40 mg/other high intensity statin # BP goal: normotension # HBAIC and Lipid profile: ordered and pending  # Telemetry monitoring # Frequent neuro checks # stroke swallow screen #PT/OT     Please page stroke NP  Or  PA  Or MD from 8am -4 pm  as this patient from this time will be  followed by the stroke.   You can look them up on [email or web address]  Password [email or web address]   Georgiana Spinner Mavrik Bynum [facility] Pager Number [phone] For questions after 7pm please refer to AMION to reach the Neurologist on call

## 2019-10-18 NOTE — NC FL2 (Signed)
[place] MEDICAID FL2 LEVEL OF CARE SCREENING TOOL     IDENTIFICATION  Patient Name: Lee Gonzalez Birthdate: 1950-11-18 Sex: male Admission Date (Current Location): 10/16/2019  [place] and [place] Number:  Chiropodist and Address:  [facility], [address], [place], [place] [address]      Provider Number: [id number]  Attending Physician Name and Address:  Rodolph Bong, MD  Relative Name and Phone Number:  [name] (wife) [phone]    Current Level of Care: Hospital Recommended Level of Care: Skilled Nursing Facility Prior Approval Number:    Date Approved/Denied:   PASRR Number: [id number]  Discharge Plan: SNF    Current Diagnoses: Patient Active Problem List   Diagnosis Date Noted  . CVA (cerebral vascular accident) [facility]): Subacute 10/18/2019  . AKI (acute kidney injury) (HCC)   . Dehydration   . Acute metabolic encephalopathy   . Fibrinolysis (HCC)   . Insulin-requiring or dependent type II diabetes mellitus (HCC)   . Failure to thrive in adult   . Hypophosphatemia   . AF (paroxysmal atrial fibrillation) (HCC)   . COVID-19 virus infection   . Seizure (HCC) 10/16/2019  . Pain due to onychomycosis of toenails of both feet 11/08/2018  . Diabetes mellitus without complication (HCC) 11/08/2018  . Coagulation disorder (HCC) 11/08/2018  . Permanent atrial fibrillation (HCC) 07/31/2018  . Thrombus of left atrial appendage 07/31/2018  . Cocaine abuse (HCC) 12/19/2017  . 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 Elevated troponin 12/19/2017  . Head pain, chronic 06/08/2016  . Altered mental status 06/14/2015  . Sepsis (HCC) 06/14/2015  . Convulsions (HCC) 05/02/2014  . Head and face pain 10/28/2013  . History of CVA (cerebrovascular accident) 10/28/2013  . Syncope 10/28/2013    Orientation RESPIRATION BLADDER Height & Weight     Self  Normal Incontinent Weight:  67 kg Height:  5\' 6"  (167.6 cm)  BEHAVIORAL SYMPTOMS/MOOD NEUROLOGICAL BOWEL NUTRITION STATUS      Incontinent Diet (see discharge summary)  AMBULATORY STATUS COMMUNICATION OF NEEDS Skin   Extensive Assist Verbally Normal                       Personal Care Assistance Level of Assistance  Bathing, Feeding, Dressing Bathing Assistance: Maximum assistance Feeding assistance: Maximum assistance Dressing Assistance: Maximum assistance     Functional Limitations Info             SPECIAL CARE FACTORS FREQUENCY  PT (By licensed PT), OT (By licensed OT)     PT Frequency: 5 times per week OT Frequency: 5 times per week            Contractures Contractures Info: Not present    Additional Factors Info  Code Status, Allergies Code Status Info: DNR Allergies Info: NKA           Current Medications (10/18/2019):  This is the current hospital active medication list Current Facility-Administered Medications  Medication Dose Route Frequency Provider Last Rate Last Admin  .  stroke: mapping our early stages of recovery book   Does not apply Once 12/18/2019, MD      . 0.45 % sodium chloride infusion   Intravenous Continuous Rodolph Bong, MD 40 mL/hr at 10/18/19 0318 New Bag at 10/18/19 0318  . 0.9 %  sodium chloride infusion   Intravenous PRN 12/18/19, MD      . acetaminophen (TYLENOL) tablet 650 mg  650 mg Oral Q4H PRN Lynn Ito, MD       Or  . acetaminophen (TYLENOL) suppository 650 mg  650 mg Rectal Q4H PRN Lynn Ito, MD      . albuterol (VENTOLIN HFA) 108 (90 Base) MCG/ACT inhaler 2 puff  2 puff Inhalation Once PRN Lynn Ito, MD      . ascorbic acid (VITAMIN C) tablet 500 mg  500 mg Oral Daily Rodolph Bong, MD   500 mg at 10/17/19 0914  . atorvastatin (LIPITOR) tablet 40 mg  40 mg Oral Daily Rodolph Bong, MD   40 mg at 10/17/19 0914  . citalopram (CELEXA) tablet 10 mg  10 mg Oral Daily Lynn Ito, MD   10 mg at 10/17/19 0915  .  diphenhydrAMINE (BENADRYL) injection 50 mg  50 mg Intravenous Once PRN Lynn Ito, MD      . docusate sodium (COLACE) capsule 100 mg  100 mg Oral BID Lynn Ito, MD   100 mg at 10/17/19 0915  . enoxaparin (LOVENOX) injection 67.5 mg  1 mg/kg Subcutaneous Q12H Hallaji, Sheema M, RPH   67.5 mg at 10/18/19 0626  . EPINEPHrine (EPI-PEN) injection 0.3 mg  0.3 mg Intramuscular Once PRN Lynn Ito, MD      . famotidine (PEPCID) IVPB 20 mg premix  20 mg Intravenous Once PRN Lynn Ito, MD      . folic acid (FOLVITE) tablet 1 mg  1 mg Oral Daily Rodolph Bong, MD   1 mg at 10/17/19 0915  . insulin aspart (novoLOG) injection 0-15 Units  0-15 Units Subcutaneous Q4H Lynn Ito, MD   3 Units at 10/18/19 0916  . levETIRAcetam (KEPPRA) IVPB 1000 mg/100 mL premix  1,000 mg Intravenous Q12H Aroor, Georgiana Spinner R, MD 400 mL/hr at 10/18/19 0319 1,000 mg at 10/18/19 0319  . loratadine (CLARITIN) tablet 10 mg  10 mg Oral Daily Rodolph Bong, MD   10 mg at 10/17/19 0916  . methylPREDNISolone sodium succinate (SOLU-MEDROL) 125 mg/2 mL injection 125 mg  125 mg Intravenous Once PRN Lynn Ito, MD      . metoprolol tartrate (LOPRESSOR) injection 5 mg  5 mg Intravenous Q6H PRN Jimmye Norman, NP   5 mg at 10/17/19 2309  . metoprolol tartrate (LOPRESSOR) tablet 25 mg  25 mg Oral BID Rodolph Bong, MD   25 mg at 10/17/19 8185  . multivitamin with minerals tablet 1 tablet  1 tablet Oral Daily Rodolph Bong, MD   1 tablet at 10/17/19 [id number]  . ondansetron (ZOFRAN) tablet 4 mg  4 mg Oral Q6H PRN Lynn Ito, MD       Or  . ondansetron (ZOFRAN) injection 4 mg  4 mg Intravenous Q6H PRN Lynn Ito, MD   4 mg at 10/18/19 0101  . senna (SENOKOT) tablet 8.6 mg  1 tablet Oral BID Lynn Ito, MD   8.6 mg at 10/17/19 0917  . thiamine tablet 100 mg  100 mg Oral Daily Rodolph Bong, MD   100 mg at 10/17/19 9702  . warfarin (COUMADIN) tablet 7.5 mg  7.5 mg Oral ONCE-1600 Shanlever, Charmayne Sheer, [facility]       . Warfarin - Pharmacist Dosing Inpatient   Does not apply [address] Albina Billet, [facility]      . zinc sulfate capsule 220 mg  220 mg Oral Daily Rodolph Bong, MD   220 mg at 10/17/19 6378     Discharge Medications: Please see discharge summary for  a list of discharge medications.  Relevant Imaging Results:  Relevant Lab Results:   Additional Information SS# [id number]  Allayne Butcher, RN

## 2019-10-18 NOTE — TOC Progression Note (Signed)
Transition of Care [facility]) - Progression Note    Patient Details  Name: Lee Gonzalez MRN: [id number] Date of Birth: [birth year]  Transition of Care [facility]) CM/SW Contact  Allayne Butcher, RN Phone Number: 10/18/2019, 9:50 AM  Clinical Narrative:    RNCM consulted for substance abuse counseling as patient was positive for cocaine at admission.  RNCM unable to discuss with patient and family reports that they are unaware of any substance use at home.     Expected Discharge Plan: Skilled Nursing Facility Barriers to Discharge: Continued Medical Work up  Expected Discharge Plan and Services Expected Discharge Plan: Skilled Nursing Facility   Discharge Planning Services: CM Consult Post Acute Care Choice: Skilled Nursing Facility Living arrangements for the past 2 months: Single Family Home                                       Social Determinants of Health (SDOH) Interventions    Readmission Risk Interventions No flowsheet data found.

## 2019-10-18 NOTE — Progress Notes (Signed)
Nutrition Brief Note   Patient identified on malnutrition screening tool for weight loss an poor appetite.  Chart reviewed.  Pt now transitioning to comfort care.  No further nutrition interventions warranted at this time.   Please consult as needed.   Lars Masson, RD, LDN Clinical Nutrition After Hours/Weekend Pager # in Amion

## 2019-10-18 NOTE — Progress Notes (Addendum)
   10/17/19 2244  Vitals  BP (!) 182/112  MAP (mmHg) 140  BP Location Left Arm  BP Method Automatic  Patient Position (if appropriate) Lying  ECG Heart Rate 85  Resp 18  MEWS COLOR  MEWS Score Color Green  Oxygen Therapy  SpO2 100 %  MEWS Score  MEWS Temp 0  MEWS Systolic 0  MEWS Pulse 0  MEWS RR 0  MEWS LOC 0  MEWS Score 0   Paged hospitalist in regards to pt's elevated blood pressure. Pt was not able to take PO metoprolol, so received order for 5mg  metoprolol IV. Will continue to monitor pt's blood pressure.

## 2019-10-18 NOTE — TOC Progression Note (Signed)
Transition of Care [facility]) - Progression Note    Patient Details  Name: Lee Gonzalez MRN: [id number] Date of Birth: [birth year]  Transition [facility] Care [facility]) CM/SW Contact  Barrie Dunker, RN Phone Number: 10/18/2019, 2:05 PM  Clinical Narrative:   Received a message from Palliative that the daughter does ot want to move forward with SNF, they want to take the patient home with Hospice, The patient will finish the medication for Covid first    Expected Discharge Plan: Skilled Nursing Facility Barriers to Discharge: Continued Medical Work up  Expected Discharge Plan and Services Expected Discharge Plan: Skilled Nursing Facility   Discharge Planning Services: CM Consult Post Acute Care Choice: Skilled Nursing Facility Living arrangements for the past 2 months: Single Family Home                                       Social Determinants of Health (SDOH) Interventions    Readmission Risk Interventions No flowsheet data found.

## 2019-10-18 NOTE — TOC Initial Note (Signed)
Transition of Care [facility]) - Initial/Assessment Note    Patient Details  Name: Lee Gonzalez MRN: [id number] Date of Birth: Jul 06, 1950  Transition of Care [facility]) CM/SW Contact:    Allayne Butcher, RN Phone Number: 10/18/2019, 9:42 AM  Clinical Narrative:                 Patient admitted to the hospital for seizure and acute metabolic encephalopathy found to be COVID +.  Patient has a history of stroke but family reports that he has not had previous seizures.  RNCM was unable to speak with patient via phone, bedside RN reports that the patient is disoriented.  RNCM was able to reach patient's wife, Windell Moulding, and Granddaughter, [place] via 3 way call.  Patient's wife is currently admitted to [facility] with COVID and the plan for her is SNF for short term rehab.  Windell Moulding and Suzette Battiest are interested in SNF for Mr. Compston as well.  Bed search will be started today.  Family reports that the patient can ambulate independently at home, his mental status has been declining over the past month or so, he forgets things quickly and is incontinent and wears adult briefs.  Patient does not drive but Windell Moulding and other family members provide transportation.  Patient is current with PCP at [facility] and gets his prescriptions from there.   TOC team will cont to follow patient progress and assist with disposition planning.   Expected Discharge Plan: Skilled Nursing Facility Barriers to Discharge: Continued Medical Work up   Patient Goals and CMS Choice Patient states their goals for this hospitalization and ongoing recovery are:: Family is interested in the patient going for short term rehab at [facility] CMS Medicare.gov Compare Post Acute Care list provided to:: Patient Represenative (must comment) Choice offered to / list presented to : Spouse Windell Moulding (wife))  Expected Discharge Plan and Services Expected Discharge Plan: Skilled Nursing Facility   Discharge Planning Services: CM Consult Post Acute Care  Choice: Skilled Nursing Facility Living arrangements for the past 2 months: Single Family Home                                      Prior Living Arrangements/Services Living arrangements for the past 2 months: Single Family Home Lives with:: Spouse Patient language and need for interpreter reviewed:: Yes Do you feel safe going back to the place where you live?: Yes      Need for Family Participation in Patient Care: Yes (Comment) (COVID) Care giver support system in place?: Yes (comment) (wife and daughter's and granddaughter)   Criminal Activity/Legal Involvement Pertinent to Current Situation/Hospitalization: No - Comment as needed  Activities of Daily Living Home Assistive Devices/Equipment: Cane (specify quad or straight) ADL Screening (condition at time of admission) Patient's cognitive ability adequate to safely complete daily activities?: No Is the patient deaf or have difficulty hearing?: No Does the patient have difficulty seeing, even when wearing glasses/contacts?: No Does the patient have difficulty concentrating, remembering, or making decisions?: Yes Patient able to express need for assistance with ADLs?: No Does the patient have difficulty dressing or bathing?: Yes Independently performs ADLs?: No Communication: Needs assistance Is this a change from baseline?: Pre-admission baseline Dressing (OT): Needs assistance Is this a change from baseline?: Pre-admission baseline Grooming: Needs assistance Is this a change from baseline?: Pre-admission baseline Feeding: Needs assistance Is this a change from baseline?: Pre-admission baseline Bathing:  Dependent Is this a change from baseline?: Pre-admission baseline Toileting: Dependent Is this a change from baseline?: Pre-admission baseline In/Out Bed: Dependent Is this a change from baseline?: Pre-admission baseline Walks in Home: Dependent Is this a change from baseline?: Pre-admission baseline Does the  patient have difficulty walking or climbing stairs?: Yes Weakness of Legs: Both Weakness of Arms/Hands: Both  Permission Sought/Granted Permission sought to share information with : Case Manager, Family Supports, Oceanographer granted to share information with : Yes, Verbal Permission Granted  Share Information with NAME: Windell Moulding and Suzette Battiest  Permission granted to share info w AGENCY: SNF's  Permission granted to share info w Relationship: wife and granddaughter     Emotional Assessment       Orientation: : Oriented to Self Alcohol / Substance Use: Illicit Drugs Psych Involvement: No (comment)  Admission diagnosis:  Syncope and collapse [R55] Dehydration [E86.0] Seizure (HCC) [R56.9] AKI (acute kidney injury) (HCC) [N17.9] Patient Active Problem List   Diagnosis Date Noted  . AKI (acute kidney injury) (HCC)   . Dehydration   . Acute metabolic encephalopathy   . Fibrinolysis (HCC)   . Insulin-requiring or dependent type II diabetes mellitus (HCC)   . Failure to thrive in adult   . Hypophosphatemia   . AF (paroxysmal atrial fibrillation) (HCC)   . COVID-19 virus infection   . Seizure (HCC) 10/16/2019  . Pain due to onychomycosis of toenails of both feet 11/08/2018  . Diabetes mellitus without complication (HCC) 11/08/2018  . Coagulation disorder (HCC) 11/08/2018  . Permanent atrial fibrillation (HCC) 07/31/2018  . Thrombus of left atrial appendage 07/31/2018  . Cocaine abuse (HCC) 12/19/2017  . 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 Elevated troponin 12/19/2017  . Head pain, chronic 06/08/2016  . Altered mental status 06/14/2015  . Sepsis (HCC) 06/14/2015  . Convulsions (HCC) 05/02/2014  . Head and face pain 10/28/2013  . History of CVA (cerebrovascular accident) 10/28/2013  . Syncope 10/28/2013   PCP:  [facility] Pharmacy:   [facility] - [place], [place] - [address] [place] [address] Phone: [phone] Fax: [phone]  [facility] [address] [place] - [address] [place] [address] Phone: [phone] Fax: [phone]     Social Determinants of Health (SDOH) Interventions    Readmission Risk Interventions No flowsheet data found.

## 2019-10-18 NOTE — Progress Notes (Signed)
SLP Cancellation Note  Patient Details Name: Lee Gonzalez MRN: [id number] DOB: 02/21/1950   Cancelled treatment:       Reason Eval/Treat Not Completed: Fatigue/lethargy limiting ability to participate;Medical issues which prohibited therapy;Patient's level of consciousness;Patient not medically ready   Chart reviewed and spoke with pt's nurse. Currently pt is not alert enough for PO trials for cognitive linguistic evaluation. Pt is now being "worked up" for possible CVA.  Nurse also reports that pt was "choking earlier in admission." Given this report, I placed order for BSE according to protocol.   ST to continue following pt for appropriateness.   Lee Gonzalez B. Dreama Saa M.S., CCC-SLP, [facility] Speech-Language Pathologist Rehabilitation Services Office [phone]    Lee Gonzalez 10/18/2019, 11:57 AM

## 2019-10-18 NOTE — Progress Notes (Addendum)
PROGRESS NOTE    Lee Gonzalez  ZOX:[id number] DOB: 17-Jul-1950 DOA: 10/16/2019 PCP: [facility]   Chief Complaint  Patient presents with  . Seizures    Brief Narrative:  HPI per Dr. Cleon Gustin is a 69 y.o. male with medical history significant of seizure disorder, diabetes mellitus, hypertension, , afib on coumadin, LA thrombus, stroke and cocaine abuse was brought in by daughter who reports about 5 days ago she received a call from her mother that stated patient had passed out.  The daughter told the mother to call EMS but they never did.  Per daughter patient had not been eating or drinking much and in bed.  Sleeping a lot.  While in the ER patient started seizing in the triage room and per nursing it lasted about 1 minute and he was postictal.  Patient then was responding to verbal stimuli at that time.  Unfortunately patient was not very interactive with physical exam for me refused to answer my questions.  Please note information was taken from ER note and also I called the daughter about patient's status.   ED Course:  In the ER he was given phenytoin.  He was also CT of the head that was negative for acute intracranial abnormality.  He was found with Covid positive test.  Apparently patient lives with his wife who was diagnosed and admitted to [facility] with coronavirus about a week ago.  He does have history of cocaine use and his urine toxicology is pending.  His creatinine is 1.62 from baseline of 1.23.  Glucose 184.  BNP 283.7, CK total 374.  Pt will be admitted for witnessed seizure.  Assessment & Plan:   Active Problems:   Thrombus of left atrial appendage   Seizure (HCC)   AKI (acute kidney injury) (HCC)   Dehydration   Acute metabolic encephalopathy   Fibrinolysis (HCC)   Insulin-requiring or dependent type II diabetes mellitus (HCC)   Failure to thrive in adult   Hypophosphatemia   AF (paroxysmal atrial  fibrillation) (HCC)   COVID-19 virus infection   CVA (cerebral vascular accident) (HCC): Subacute  1 seizure disorder/breakthrough seizure Patient presented with decreased oral intake, syncopal episode at home several days prior to admission, increased sleepiness and noted to have a witnessed seizure in the ED lasting about a minute and noted to be postictal at that time.  Head CT done negative for any acute intracranial abnormalities.  COVID-19 PCR positive.  Patient with history of seizure disorder and per admitting physician daughter stated usually develops seizures when he is sick.  Patient currently on seizure precautions, aspiration precautions.  Patient loaded with phenytoin and phenytoin ordered per pharmacy.  UDS positive for cocaine.  Consulted neurology this morning and per their note patient not on AED epileptic drugs at home.  Neurology recommending routine EEG which was done which was negative for any epileptiform discharges.  MRI brain concerning for subacute CVA.  Patient with no further seizures.  Patient has been changed from Dilantin to Keppra per neurology.  Continue seizure precautions.  Neurology following and appreciate input and recommendations.  2.  Subacute CVA Noted on MRI brain.  Stroke work-up ordered and underway.  Carotid Dopplers done.  Patient placed on full dose Lovenox as INR was subtherapeutic prior to admission.  PT/OT/ST.  Palliative care following and decision made to transition to full comfort measures.  3.  Acute metabolic encephalopathy likely secondary to subacute CVA Patient noted with  some confusion however difficult to ascertain as patient in the airborne room which is pretty loud.  Concern that acute metabolic encephalopathy may be secondary to problem #1.  Patient noted to have a history of A. fib, LAA thrombus on Coumadin noted on admission to have a subtherapeutic INR of 1.2.  UDS on admission noted to be positive for cocaine.  Head CT done negative.  MRI  brain concerning for subacute CVA.  EEG done with no epileptiform discharges noted.  Urinalysis unremarkable.  Patient on full dose Lovenox due to subtherapeutic INR.  Palliative care consulted and decision made to be transition to full comfort measures.  4.  COVID-19 infection Patient wife noted to be diagnosed with COVID-19 approximately a week ago and admitted to [facility].  Chest x-ray done with cardiomegaly with prominent bibasilar interstitial markings which may reflect mild interstitial edema versus atypical viral infection.  LDH this morning was 295, ferritin at 464, procalcitonin 0.71, fibrin derivatives > 7,500 from > 10000.  Patient not hypoxic sats of 96% on room air.  Status post REGEN-COV x 1.  Supportive care.  If patient becomes hypoxic, with worsening fibrinogen derivatives despite full dose Lovenox will place on IV Solu-Medrol and IV remdesivir.  Follow inflammatory markers.  Family transitioning to full comfort measures.  Follow.  5.  Elevated fibrin derivatives Fibrin derivatives > 10,000.  Patient with history of A. fib, LAA thrombus on Coumadin however on presentation INR subtherapeutic.  Coumadin likely poorly controlled.  Lower extremity Dopplers negative for DVT.  Fibrin derivatives trending down.  Patient not hypoxic.  Patient being transitioned to full comfort measures.  Follow.    6.  History of A. fib with LAA thrombus INR subtherapeutic.  Patient on Coumadin.  Coumadin poorly managed.  Continue current full dose Lovenox.  Patient being transitioned to full comfort measures.  Will discontinue Coumadin per pharmacy and continue Lovenox while in-house.   7.  Hypophosphatemia Repleted.  8.  Acute kidney injury on chronic kidney disease stage III Likely secondary to prerenal azotemia secondary to poor oral intake in the presence of diuretics.  Diuretics on hold.  Patient being gently hydrated.  Renal function fluctuating and creatinine currently at 1.63.  Continue  to hold diuretics.  Saline lock IV fluids.    9.  History of cocaine use/polysubstance abuse UDS positive for cocaine.  Polysubstance cessation.  TOC consulted.  10.  Insulin-dependent diabetes mellitus Patient with poor oral intake.  Hemoglobin A1c 8.0.  CBG noted to be 180 this morning.  Will discontinue sliding scale insulin.  Patient being transitioned to comfort measures.  Follow.   11.  Failure to thrive Patient noted to have poor oral intake prior to admission.  Patient with no oral intake per RN.  Patient seen by palliative care will discuss with family and decision has been made to transition to comfort measures and likely home with hospice.  Appreciate palliative care input and recommendations.     DVT prophylaxis: Lovenox Code Status: DNR Family Communication: Updated patient. Updated daughter Olivia Mackie on the telephone.  Disposition:   Status is: Observation    Dispo: The patient is from: Home              Anticipated d/c is to: Home with hospice following.               Anticipated d/c date is: Hopefully in 1-2 days.              Patient currently  being treated for COVID-19 infection, neurology evaluating for seizures.  Patient with subacute CVA noted on MRI brain.  Patient moaning.  Not stable for discharge.       Consultants:   Neurology: Dr.Aroor 10/17/2019  Palliative care: Stacie Acres, NP 10/18/2019  Procedures:  CT head 10/16/2019  Chest x-ray 10/16/2019  EEG 10/17/2019  MRI brain 10/17/2019  Carotid Dopplers pending 10/18/2019  Lower extremity Dopplers 10/17/2019  Antimicrobials:   None   Subjective: Patient laying in bed. Moaning.  Really not answering any questions.  No seizures noted.  Objective: Vitals:   10/18/19 0300 10/18/19 0500 10/18/19 0600 10/18/19 0821  BP: (!) 182/92 (!) 181/129 (!) 175/97 (!) 164/97  Pulse: 80 77  69  Resp: 19 20  20   Temp:  98.8 F (37.1 C)  98.6 F (37 C)  TempSrc:  Oral  Axillary  SpO2: 100% 98%  97%  Weight:       Height:        Intake/Output Summary (Last 24 hours) at 10/18/2019 1024 Last data filed at 10/17/2019 2345 Gross per 24 hour  Intake --  Output 900 ml  Net -900 ml   Filed Weights   10/16/19 1413 10/16/19 1416  Weight: 67 kg 67 kg    Examination:  General exam: NAD Respiratory system: CTAB anterior lung fields.  No wheezing noted.  No crackles.  Cardiovascular system: Irregularly irregular.  No murmurs rubs or gallops.  No JVD.  No lower extremity edema. Gastrointestinal system: Abdomen is soft, nontender, nondistended, positive bowel sounds.  No rebound.  No guarding.   Central nervous system: Moaning.  Moving extremities spontaneously.  Extremities: Symmetric 5 x 5 power. Skin: No rashes, lesions or ulcers Psychiatry: Judgement and insight appear poor to fair. Mood & affect appropriate.     Data Reviewed: I have personally reviewed following labs and imaging studies  CBC: Recent Labs  Lab 10/16/19 1420 10/17/19 1027 10/18/19 0522  WBC 4.9 5.1 8.3  NEUTROABS  --  4.4 7.1  HGB 15.2 15.5 16.1  HCT 48.1 47.5 52.3*  MCV 88.9 88.0 92.6  PLT 117* 117* 113*    Basic Metabolic Panel: Recent Labs  Lab 10/16/19 1420 10/17/19 1027 10/18/19 0522  NA 138 139 139  K 4.5 4.0 4.7  CL 101 102 103  CO2 19* 26 20*  GLUCOSE 184* 157* 161*  BUN 36* 28* 24*  CREATININE 1.62* 1.44* 1.63*  CALCIUM 8.9 8.8* 8.8*  MG  --  2.1 2.0  PHOS  --  1.8* 3.7    GFR: Estimated Creatinine Clearance: 38.6 mL/min (A) (by C-G formula based on SCr of 1.63 mg/dL (H)).  Liver Function Tests: Recent Labs  Lab 10/16/19 1420 10/17/19 1027 10/18/19 0522  AST 40 38 38  ALT 16 17 14   ALKPHOS 55 55 52  BILITOT 1.3* 1.2 1.4*  PROT 7.1 7.3 6.9  ALBUMIN 3.4* 3.5 3.1*    CBG: Recent Labs  Lab 10/17/19 0732 10/17/19 1155 10/17/19 1617 10/18/19 0510 10/18/19 0832  GLUCAP 221* 83 73 144* 180*     Recent Results (from the past 240 hour(s))  SARS Coronavirus 2 by RT PCR (hospital  order, performed in [facility] hospital lab) Nasopharyngeal Nasopharyngeal Swab     Status: Abnormal   Collection Time: 10/16/19  3:35 PM   Specimen: Nasopharyngeal Swab  Result Value Ref Range Status   SARS Coronavirus 2 POSITIVE (A) NEGATIVE Final    Comment: RESULT CALLED TO, READ BACK BY AND VERIFIED  WITH: MITCH BARKER @1722  10/16/19 MJU (NOTE) SARS-CoV-2 target nucleic acids are DETECTED  SARS-CoV-2 RNA is generally detectable in upper respiratory specimens  during the acute phase of infection.  Positive results are indicative  of the presence of the identified virus, but do not rule out bacterial infection or co-infection with other pathogens not detected by the test.  Clinical correlation with patient history and  other diagnostic information is necessary to determine patient infection status.  The expected result is negative.  Fact Sheet for Patients:   12/16/19   Fact Sheet for Healthcare Providers:   BoilerBrush.com.cy    This test is not yet approved or cleared by the https://pope.com/ FDA and  has been authorized for detection and/or diagnosis of SARS-CoV-2 by FDA under an Emergency Use Authorization (EUA).  This EUA will remain in effect (meaning this test ca n be used) for the duration of  the COVID-19 declaration under Section 564(b)(1) of the Act, 21 U.S.C. section 360-bbb-3(b)(1), unless the authorization is terminated or revoked sooner.  Performed at [facility], [address], [place], [place]          Radiology Studies: EEG  Result Date: 10/17/2019 12/17/2019, MD     10/17/2019  3:22 PM Patient Name: Tyrel Lex MRN: Girtha Hake Epilepsy Attending: [id number] Referring Physician/Provider: Dr. Charlsie Quest Aroor Date: 10/17/2018 Duration: 23.08 minutes Patient history: 69 year old male with history of seizures not on any AEDs presented with generalized tonic-clonic  seizure.  EEG to evaluate for seizures. Level of alertness: lethargic AEDs during EEG study: Keppra Technical aspects: This EEG study was done with scalp electrodes positioned according to the 10-20 International system of electrode placement. Electrical activity was acquired at a sampling rate of 500Hz  and reviewed with a high frequency filter of 70Hz  and a low frequency filter of 1Hz . EEG data were recorded continuously and digitally stored. Description: No posterior dominant rhythm was seen.  EEG showed continuous generalized 3-5 theta-delta slowing.  Hyperventilation and photic stimulation were not performed.   ABNORMALITY -Continuous slow, generalized IMPRESSION: This study is suggestive of moderate to severe diffuse encephalopathy, nonspecific to etiology.  No seizures or epileptiform discharges were seen throughout the recording. 78   CT Head Wo Contrast  Result Date: 10/16/2019 CLINICAL DATA:  Seizure, COVID positive EXAM: CT HEAD WITHOUT CONTRAST TECHNIQUE: Contiguous axial images were obtained from the base of the skull through the vertex without intravenous contrast. COMPARISON:  2019 FINDINGS: Brain: There is no acute intracranial hemorrhage, mass effect, or edema. No new loss of gray-white differentiation. Left greater than right frontal and left parietal encephalomalacia. Small chronic left thalamic infarct. Additional patchy hypoattenuation in the supratentorial white matter likely reflects similar chronic microvascular ischemic changes. Prominence of the ventricles and sulci reflects similar parenchymal volume loss. There is no extra-axial fluid collection. Vascular: There is atherosclerotic calcification at the skull base. Skull: Calvarium is unremarkable. Sinuses/Orbits: No acute finding. Other: Large occipital scalp lipoma. IMPRESSION: No acute intracranial abnormality. Stable chronic findings detailed above. Electronically Signed   By: M.D.   On: 10/16/2019 15:13     MR BRAIN WO CONTRAST  Result Date: 10/18/2019 CLINICAL DATA:  Not sick brain injury. EXAM: MRI HEAD WITHOUT CONTRAST TECHNIQUE: Multiplanar, multiecho pulse sequences of the brain and surrounding structures were obtained without intravenous contrast. COMPARISON:  Head CT from 2 days ago and brain MRI 06/15/2015 FINDINGS: Brain: Mildly diffusion hyperintense and ADC isointense abnormality along the left  para median genu of the corpus callosum. Patchy remote cortical infarcts along the left frontal and parietal cortex. Remote perforator infarct at the left corona radiata. Chronic lacunar infarct at the left thalamus. No acute hemorrhage, hydrocephalus, or masslike finding. There is significant motion artifact. Vascular: Preserved flow voids with intracranial vascular tortuosity. Skull and upper cervical spine: Negative for marrow lesion Sinuses/Orbits: Negative. 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 IMPRESSION: 1. 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 2. Background of chronic ischemic injury and age advanced atrophy. 3. Significant motion degradation. Electronically Signed   By: Marnee Spring M.D.   On: 10/18/2019 05:23   US Venous Img Lower Bilateral (DVT)  Result Date: 10/18/2019 CLINICAL DATA:  69 year old male with COVID-19, possible DVT EXAM: BILATERAL LOWER EXTREMITY VENOUS DOPPLER ULTRASOUND TECHNIQUE: Gray-scale sonography with graded compression, as well as color Doppler and duplex ultrasound were performed to evaluate the lower extremity deep venous systems from the level of the common femoral vein and including the common femoral, femoral, profunda femoral, popliteal and calf veins including the posterior tibial, peroneal and gastrocnemius veins when visible. The superficial great saphenous vein was also interrogated. Spectral Doppler was utilized to evaluate flow at rest and with distal augmentation maneuvers in the common femoral, femoral and  popliteal veins. COMPARISON:  None. FINDINGS: RIGHT LOWER EXTREMITY Common Femoral Vein: No evidence of thrombus. Normal compressibility, respiratory phasicity and response to augmentation. Saphenofemoral Junction: No evidence of thrombus. Normal compressibility and flow on color Doppler imaging. Profunda Femoral Vein: No evidence of thrombus. Normal compressibility and flow on color Doppler imaging. Femoral Vein: No evidence of thrombus. Normal compressibility, respiratory phasicity and response to augmentation. Popliteal Vein: No evidence of thrombus. Normal compressibility, respiratory phasicity and response to augmentation. Calf Veins: No evidence of thrombus. Normal compressibility and flow on color Doppler imaging. Superficial Great Saphenous Vein: No evidence of thrombus. Normal compressibility. Venous Reflux:  None. Other Findings:  None. LEFT LOWER EXTREMITY Common Femoral Vein: No evidence of thrombus. Normal compressibility, respiratory phasicity and response to augmentation. Saphenofemoral Junction: No evidence of thrombus. Normal compressibility and flow on color Doppler imaging. Profunda Femoral Vein: No evidence of thrombus. Normal compressibility and flow on color Doppler imaging. Femoral Vein: No evidence of thrombus. Normal compressibility, respiratory phasicity and response to augmentation. Popliteal Vein: No evidence of thrombus. Normal compressibility, respiratory phasicity and response to augmentation. Calf Veins: No evidence of thrombus. Normal compressibility and flow on color Doppler imaging. Superficial Great Saphenous Vein: No evidence of thrombus. Normal compressibility. Venous Reflux:  None. Other Findings:  None. IMPRESSION: No evidence of deep venous thrombosis. Electronically Signed   By: Malachy Moan M.D.   On: 10/18/2019 08:03   DG Chest Portable 1 View  Result Date: 10/16/2019 CLINICAL DATA:  Seizure EXAM: PORTABLE CHEST 1 VIEW COMPARISON:  06/14/2015 FINDINGS:  Cardiomegaly. Atherosclerotic calcification of the aortic knob. Prominent bibasilar interstitial markings. No pleural effusion or pneumothorax. No acute osseous findings. IMPRESSION: Cardiomegaly with prominent bibasilar interstitial markings, which may reflect mild interstitial edema versus atypical/viral infection. Electronically Signed   By: Duanne Guess D.O.   On: 10/16/2019 14:57        Scheduled Meds: .  stroke: mapping our early stages of recovery book   Does not apply Once  . vitamin C  500 mg Oral Daily  . atorvastatin  40 mg Oral Daily  . citalopram  10 mg Oral Daily  . docusate sodium  100 mg  Oral BID  . enoxaparin (LOVENOX) injection  1 mg/kg Subcutaneous Q12H  . folic acid  1 mg Oral Daily  . insulin aspart  0-15 Units Subcutaneous Q4H  . loratadine  10 mg Oral Daily  . metoprolol tartrate  25 mg Oral BID  . multivitamin with minerals  1 tablet Oral Daily  . senna  1 tablet Oral BID  . thiamine  100 mg Oral Daily  . warfarin  7.5 mg Oral ONCE-1600  . Warfarin - Pharmacist Dosing Inpatient   Does not apply q1600  . zinc sulfate  220 mg Oral Daily   Continuous Infusions: . sodium chloride 40 mL/hr at 10/18/19 0318  . sodium chloride    . famotidine (PEPCID) IV    . levETIRAcetam 1,000 mg (10/18/19 0319)     LOS: 1 day    Time spent: 35 minutes    Ramiro Harvestaniel Jalyric Kaestner, MD [facility]   To contact the attending provider between 7A-7P or the covering provider during after hours 7P-7A, please log into the web site [email or web address] and access using universal [place] password for that web site. If you do not have the password, please call the hospital operator.  10/18/2019, 10:24 AM

## 2019-10-18 NOTE — Consult Note (Addendum)
ANTICOAGULATION CONSULT NOTE - Initial Consult  Pharmacy Consult for Warfarin/Lovenox Bridge Indication: atrial fibrillation/LAA clot  No Known Allergies  Patient Measurements: Height: 5\' 6"  (167.6 cm) Weight: 67 kg (147 lb 11.3 oz) IBW/kg (Calculated) : 63.8  Vital Signs: Temp: 98.8 F (37.1 C) (09/03 0500) Temp Source: Oral (09/03 0500) BP: 175/97 (09/03 0600) Pulse Rate: 77 (09/03 0500)  Labs: Recent Labs    10/16/19 1420 10/16/19 1420 10/17/19 1027 10/18/19 0522  HGB 15.2   < > 15.5 16.1  HCT 48.1  --  47.5 52.3*  PLT 117*  --  117* 113*  LABPROT 14.5  --  13.3 16.4*  INR 1.2  --  1.1 1.4*  CREATININE 1.62*  --  1.44*  --   CKTOTAL 374  --   --   --    < > = values in this interval not displayed.    Estimated Creatinine Clearance: 43.7 mL/min (A) (by C-G formula based on SCr of 1.44 mg/dL (H)).   Medical History: Past Medical History:  Diagnosis Date  . Diabetes mellitus without complication (HCC)   . Hypertension   . Seizures (HCC)   . Stroke [facility])     Medications:  Pt reported home dose of warfarin is 7.5mg  qd, with last dosing currently unknown with an admission INR of 1.2.  Pt was started on phenytoin for seizures since admission, which can enhance anticoagulant effect of warfarin, but has since been changed to Keppra (on 9/2), which has no documented interaction with warfarin.  Assessment: 69 yo male with hx of coagulation disorder, hx of CVA, permanent a fib, hx of LA thrombus and cocaine use with recent seizures (including in ER triage) and new COVID infection.  Pharmacy has been consulted to resume and monitor home warfarin therapy w/therapeutic enoxaparin for bridging.   D-Dimer >10,0000   Date INR Warfarin dose  9/1 1.2 --  9/2 1.1 7.5mg   9/3 1.4 7.5mg        Goal of Therapy:  INR 2-3 Monitor platelets by anticoagulation protocol: Yes   Plan:   WARFARIN:  Continue pt home dose of warfarin 7.5mg  for now, but given pt new start of  phenytoin (now discontinued, last dose 9/2 @ 0917), will likely have to monitor closely once pt approaches therapeutic INR per interaction listed above.    ENOXAPARIN:  Continue therapeutic enoxaparin at 1 mg/kg q12 hours until INR therapeutic x 2.   Pharmacy will monitor INR and CBC daily, adjusting dose based on levels.  0918, PharmD, BCPS Clinical Pharmacist 10/18/2019 7:31 AM

## 2019-10-18 NOTE — Progress Notes (Addendum)
   10/18/19 0100  Vitals  BP (!) 176/114  MAP (mmHg) 133  BP Location Left Arm  BP Method Automatic  Patient Position (if appropriate) Lying  Pulse Rate 69  Pulse Rate Source Monitor  ECG Heart Rate 75  Resp 20  Level of Consciousness  Level of Consciousness Alert  MEWS COLOR  MEWS Score Color Green  Oxygen Therapy  SpO2 99 %  O2 Device Room Air  MEWS Score  MEWS Temp 0  MEWS Systolic 0  MEWS Pulse 0  MEWS RR 0  MEWS LOC 0  MEWS Score 0   Paged hospitalist in regards to patient's BP not responding to IV metoprolol given. After multiple BP readings with a new cuff and on different sites this was the best reading I could get. MD advised to continue to monitor pt but no new orders at this time.

## 2019-10-18 NOTE — Consult Note (Signed)
Consultation Note Date: 10/18/2019   Patient Name: Lee Gonzalez  DOB: [birth year]  MRN: [id number]  Age / Sex: 69 y.o., male  PCP: [employer] Referring Physician: Rodolph Bong, MD  Reason for Consultation: Establishing goals of care  HPI/Patient Profile: 69 y.o. male  with past medical history of seizure disorder, DM, HTN, a fib on coumadin, LA thrombus, CVA, and cocaine abuse admitted on 10/16/2019 with failure to thrive. He was found to be COVID positive. Had witnessed seizure in ED. Wife is in [facility] hospital with COVID.   MRI of brain with subacute infarct. INR subtherapeutic. Patient continues with AMS and poor-no PO intake. PMT consulted to discuss GOC.  Clinical Assessment and Goals of Care: I have reviewed medical records including EPIC notes, labs and imaging, assessed the patient and then spoke with patient's daughter Lee Gonzalez, to discuss diagnosis prognosis, GOC, EOL wishes, disposition and options.  Lee Gonzalez tells me that patient's next of kin is his wife; however, she is admitted to [facility] hospital with COVID. Lee Gonzalez tells me she is worse today - worsening oxygen levels and asks that we not call her. Lee Gonzalez tells me she is the oldest and has been serving as Management consultant.   I introduced Palliative Medicine as specialized medical care for people living with serious illness. It focuses on providing relief from the symptoms and stress of a serious illness. The goal is to improve quality of life for both the patient and the family.  Lee Gonzalez tells me her father has not been doing well at home. She tells me he sleeps all the time. She tells me his intake has drastically declined - with very little food or drink intake for the past several weeks. We discuss his mental status - at baseline he is confused but is verbally responsive. He will usually follow commands. We discuss his mental status today - only moans and will not  follow commands - she confirms this is not his baseline.    We discussed patient's current illness and what it means in the larger context of patient's on-going co-morbidities.  Natural disease trajectory and expectations at EOL were discussed. We discuss chronic illnesses - history of CVA and LA thrombus,weeks of poor appetite and poor functional status. We also discuss his worsening in the hospital - no PO intake and worsening mental status.   Lee Gonzalez tells me she feels like patient is nearing end of life. She tells me of great losses in patient's life (loss of child and grandson) - she tells me she feels like he is "tired" and "ready". She tells me about her decision for DNR status as this aligns with his previously expressed wishes.   The difference between aggressive medical intervention and comfort care was considered in light of the patient's goals of care. Lee Gonzalez would like to shift patient's care to focus more on his comfort and avoid any further aggressive medical therapy.  Discussed with Lee Gonzalez the importance of continued conversation with family and the medical providers regarding overall plan of care and treatment options, ensuring decisions are within the context of the patient's values and GOCs.    Hospice and Palliative Care services outpatient were explained and offered. Lee Gonzalez is interested in receiving hospice services at home - she would like patient to be discharged to her home with hospice.   Questions and concerns were addressed. The family was encouraged to call with questions or concerns.   Primary Decision Maker NEXT OF KIN - daughter Lee Gonzalez (  next of kin is wife however she is ill in hospital and per daughter she is not doing well and daughter requests we not call her)    SUMMARY OF RECOMMENDATIONS   - daughter interested in shifting care to comfort - avoid any further aggressive medical interventions - when attending MD feels patient is ready for discharge daughter would  like patient discharged to her home with support of hospice services - [facility] consulted for hospice services at home - will add low dose PRN morphine for pain/shortness of breath  Code Status/Advance Care Planning:  DNR  Prognosis:   < 6 months  Discharge Planning: Home with Hospice      Primary Diagnoses: Present on Admission: . CVA (cerebral vascular accident) [facility]): Subacute   I have reviewed the medical record, interviewed the patient and family, and examined the patient. The following aspects are pertinent.  Past Medical History:  Diagnosis Date  . Diabetes mellitus without complication (HCC)   . Hypertension   . Seizures (HCC)   . Stroke [facility])    Social History   Socioeconomic History  . Marital status: Married    Spouse name: Not on file  . Number of children: Not on file  . Years of education: Not on file  . Highest education level: Not on file  Occupational History  . Occupation: retired  Tobacco Use  . Smoking status: Former Smoker    Packs/day: 0.50    Years: 10.00    Pack years: 5.00    Types: Cigarettes    Quit date: 07/12/1997    Years since quitting: 22.2  . Smokeless tobacco: Never Used  Vaping Use  . Vaping Use: Never assessed  Substance and Sexual Activity  . Alcohol use: No    Alcohol/week: 0.0 standard drinks  . Drug use: No  . Sexual activity: Never  Other Topics Concern  . Not on file  Social History Narrative  . Not on file   Social Determinants of Health   Financial Resource Strain:   . Difficulty of Paying Living Expenses: Not on file  Food Insecurity:   . Worried About Programme researcher, broadcasting/film/video in the Last Year: Not on file  . Ran Out of Food in the Last Year: Not on file  Transportation Needs:   . Lack of Transportation (Medical): Not on file  . Lack of Transportation (Non-Medical): Not on file  Physical Activity:   . Days of Exercise per Week: Not on file  . Minutes of Exercise per Session: Not on file  Stress:   . Feeling  of Stress : Not on file  Social Connections:   . Frequency of Communication with Friends and Family: Not on file  . Frequency of Social Gatherings with Friends and Family: Not on file  . Attends Religious Services: Not on file  . Active Member of Clubs or Organizations: Not on file  . Attends Banker Meetings: Not on file  . Marital Status: Not on file   Family History  Problem Relation Age of Onset  . Diabetes Mellitus II Father   . Cancer Mother    Scheduled Meds: . vitamin C  500 mg Oral Daily  . atorvastatin  40 mg Oral Daily  . citalopram  10 mg Oral Daily  . docusate sodium  100 mg Oral BID  . enoxaparin (LOVENOX) injection  1 mg/kg Subcutaneous Q12H  . folic acid  1 mg Oral Daily  . insulin aspart  0-15 Units Subcutaneous Q4H  .  loratadine  10 mg Oral Daily  . metoprolol tartrate  25 mg Oral BID  . multivitamin with minerals  1 tablet Oral Daily  . senna  1 tablet Oral BID  . thiamine  100 mg Oral Daily  . warfarin  7.5 mg Oral ONCE-1600  . Warfarin - Pharmacist Dosing Inpatient   Does not apply q1600  . zinc sulfate  220 mg Oral Daily   Continuous Infusions: . sodium chloride 40 mL/hr at 10/18/19 0318  . sodium chloride    . famotidine (PEPCID) IV    . levETIRAcetam 1,000 mg (10/18/19 1334)   PRN Meds:.sodium chloride, acetaminophen **OR** acetaminophen, albuterol, diphenhydrAMINE, EPINEPHrine, famotidine (PEPCID) IV, methylPREDNISolone (SOLU-MEDROL) injection, metoprolol tartrate, ondansetron **OR** ondansetron (ZOFRAN) IV No Known Allergies Review of Systems  Unable to perform ROS: Mental status change    Physical Exam Constitutional:      Comments: Lethargic, moans but does not answer yes/no questions, unable to follow commands  Cardiovascular:     Rate and Rhythm: Normal rate. Rhythm irregular.  Pulmonary:     Comments: Mild tachypnea on exam Musculoskeletal:     Right lower leg: No edema.     Left lower leg: No edema.  Skin:    General:  Skin is warm and dry.  Neurological:     Mental Status: He is disoriented.  Psychiatric:        Attention and Perception: He is inattentive.        Speech: He is noncommunicative.        Cognition and Memory: Cognition is impaired.        Judgment: Judgment is inappropriate.     Vital Signs: BP (!) 152/99 (BP Location: Left Arm)   Pulse 73   Temp 99.6 F (37.6 C) (Axillary)   Resp 20   Ht 5\' 6"  (1.676 m)   Wt 67 kg   SpO2 98%   BMI 23.84 kg/m  Pain Scale: 0-10   Pain Score: 0-No pain   SpO2: SpO2: 98 % O2 Device:SpO2: 98 % O2 Flow Rate: .O2 Flow Rate (L/min): 3 L/min  IO: Intake/output summary:   Intake/Output Summary (Last 24 hours) at 10/18/2019 1349 Last data filed at 10/17/2019 2345 Gross per 24 hour  Intake --  Output 900 ml  Net -900 ml    LBM:   Baseline Weight: Weight: 67 kg Most recent weight: Weight: 67 kg     Palliative Assessment/Data: PPS 10% d/t PO intake    Time Total: 85 minutes Greater than 50%  of this time was spent counseling and coordinating care related to the above assessment and plan.  12/17/2019, DNP, AGNP-C Palliative Medicine Team [phone] Pager: [phone]

## 2019-10-18 NOTE — Progress Notes (Signed)
OT Cancellation Note  Patient Details Name: Lee Gonzalez MRN: [id number] DOB: 1950-09-18   Cancelled Treatment:    Reason Eval/Treat Not Completed: Fatigue/lethargy limiting ability to participate;Medical issues which prohibited therapy;Patient not medically ready. Pt very lethargic in room and possible new stroke per staff report. OT will follow up when pt is able to participate with OT intervention.   Jackquline Denmark, MS, OTR/L , CBIS ascom [phone]  10/18/19, 1:19 PM   10/18/2019, 1:18 PM

## 2019-10-19 LAB — PROCALCITONIN: Procalcitonin: 1.12 ng/mL

## 2019-10-19 LAB — CBC WITH DIFFERENTIAL/PLATELET
Abs Immature Granulocytes: 0.02 10*3/uL (ref 0.00–0.07)
Basophils Absolute: 0 10*3/uL (ref 0.0–0.1)
Basophils Relative: 0 %
Eosinophils Absolute: 0 10*3/uL (ref 0.0–0.5)
Eosinophils Relative: 0 %
HCT: 48 % (ref 39.0–52.0)
Hemoglobin: 15.4 g/dL (ref 13.0–17.0)
Immature Granulocytes: 1 %
Lymphocytes Relative: 15 %
Lymphs Abs: 0.6 10*3/uL — ABNORMAL LOW (ref 0.7–4.0)
MCH: 28.3 pg (ref 26.0–34.0)
MCHC: 32.1 g/dL (ref 30.0–36.0)
MCV: 88.2 fL (ref 80.0–100.0)
Monocytes Absolute: 0.3 10*3/uL (ref 0.1–1.0)
Monocytes Relative: 9 %
Neutro Abs: 2.8 10*3/uL (ref 1.7–7.7)
Neutrophils Relative %: 75 %
Platelets: 146 10*3/uL — ABNORMAL LOW (ref 150–400)
RBC: 5.44 MIL/uL (ref 4.22–5.81)
RDW: 14.1 % (ref 11.5–15.5)
WBC: 3.7 10*3/uL — ABNORMAL LOW (ref 4.0–10.5)
nRBC: 0 % (ref 0.0–0.2)

## 2019-10-19 LAB — COMPREHENSIVE METABOLIC PANEL
ALT: 15 U/L (ref 0–44)
AST: 35 U/L (ref 15–41)
Albumin: 3.1 g/dL — ABNORMAL LOW (ref 3.5–5.0)
Alkaline Phosphatase: 53 U/L (ref 38–126)
Anion gap: 11 (ref 5–15)
BUN: 28 mg/dL — ABNORMAL HIGH (ref 8–23)
CO2: 26 mmol/L (ref 22–32)
Calcium: 8.9 mg/dL (ref 8.9–10.3)
Chloride: 105 mmol/L (ref 98–111)
Creatinine, Ser: 1.49 mg/dL — ABNORMAL HIGH (ref 0.61–1.24)
GFR calc Af Amer: 55 mL/min — ABNORMAL LOW (ref >60)
GFR calc non Af Amer: 47 mL/min — ABNORMAL LOW (ref >60)
Glucose, Bld: 145 mg/dL — ABNORMAL HIGH (ref 70–99)
Potassium: 4.5 mmol/L (ref 3.5–5.1)
Sodium: 142 mmol/L (ref 135–145)
Total Bilirubin: 0.9 mg/dL (ref 0.3–1.2)
Total Protein: 7 g/dL (ref 6.5–8.1)

## 2019-10-19 LAB — FERRITIN: Ferritin: 746 ng/mL — ABNORMAL HIGH (ref 24–336)

## 2019-10-19 LAB — MAGNESIUM: Magnesium: 2.1 mg/dL (ref 1.7–2.4)

## 2019-10-19 LAB — LIPID PANEL
Cholesterol: 128 mg/dL (ref 0–200)
HDL: 43 mg/dL (ref >40)
LDL Cholesterol: 64 mg/dL (ref 0–99)
Total CHOL/HDL Ratio: 3 RATIO
Triglycerides: 106 mg/dL (ref <150)
VLDL: 21 mg/dL (ref 0–40)

## 2019-10-19 LAB — FIBRIN DERIVATIVES D-DIMER (ARMC ONLY): Fibrin derivatives D-dimer (ARMC): 5279.86 ng/mL (FEU) — ABNORMAL HIGH (ref 0.00–499.00)

## 2019-10-19 LAB — PROTIME-INR
INR: 1.5 — ABNORMAL HIGH (ref 0.8–1.2)
Prothrombin Time: 17.1 seconds — ABNORMAL HIGH (ref 11.4–15.2)

## 2019-10-19 LAB — PHOSPHORUS: Phosphorus: 2.5 mg/dL (ref 2.5–4.6)

## 2019-10-19 LAB — C-REACTIVE PROTEIN: CRP: 13.2 mg/dL — ABNORMAL HIGH (ref <1.0)

## 2019-10-19 LAB — HEMOGLOBIN A1C
Hgb A1c MFr Bld: 8.5 % — ABNORMAL HIGH (ref 4.8–5.6)
Mean Plasma Glucose: 197.25 mg/dL

## 2019-10-19 LAB — GLUCOSE, CAPILLARY
Glucose-Capillary: 133 mg/dL — ABNORMAL HIGH (ref 70–99)
Glucose-Capillary: 223 mg/dL — ABNORMAL HIGH (ref 70–99)
Glucose-Capillary: 189 mg/dL — ABNORMAL HIGH (ref 70–99)
Glucose-Capillary: 142 mg/dL — ABNORMAL HIGH (ref 70–99)

## 2019-10-19 MED ORDER — AMLODIPINE BESYLATE 10 MG PO TABS
10.0000 mg | ORAL_TABLET | Freq: Every day | ORAL | Status: DC
  Administered 2019-10-19 – 2019-10-22 (×4): 10 mg via ORAL
  Filled 2019-10-19 (×4): qty 1

## 2019-10-19 MED ORDER — INSULIN GLARGINE 100 UNIT/ML ~~LOC~~ SOLN
15.0000 [IU] | Freq: Every day | SUBCUTANEOUS | Status: DC
  Administered 2019-10-19 – 2019-10-21 (×3): 15 [IU] via SUBCUTANEOUS
  Filled 2019-10-19 (×4): qty 0.15

## 2019-10-19 MED ORDER — INSULIN ASPART 100 UNIT/ML ~~LOC~~ SOLN
0.0000 [IU] | Freq: Three times a day (TID) | SUBCUTANEOUS | Status: DC
  Administered 2019-10-19 – 2019-10-21 (×2): 2 [IU] via SUBCUTANEOUS
  Administered 2019-10-21: 1 [IU] via SUBCUTANEOUS
  Filled 2019-10-19 (×3): qty 1

## 2019-10-19 MED ORDER — WARFARIN SODIUM 7.5 MG PO TABS
7.5000 mg | ORAL_TABLET | Freq: Once | ORAL | Status: DC
  Filled 2019-10-19: qty 1

## 2019-10-19 MED ORDER — METOPROLOL TARTRATE 5 MG/5ML IV SOLN
5.0000 mg | Freq: Three times a day (TID) | INTRAVENOUS | Status: DC
  Administered 2019-10-19 – 2019-10-21 (×6): 5 mg via INTRAVENOUS
  Filled 2019-10-19 (×6): qty 5

## 2019-10-19 NOTE — Evaluation (Addendum)
Clinical/Bedside Swallow Evaluation Patient Details  Name: Lee Gonzalez MRN: [id number] Date of Birth: 1950-09-23  Today's Date: 10/19/2019 Time: SLP Start Time (ACUTE ONLY): 0920 SLP Stop Time (ACUTE ONLY): 1010 SLP Time Calculation (min) (ACUTE ONLY): 50 min  Past Medical History:  Past Medical History:  Diagnosis Date  . Diabetes mellitus without complication (HCC)   . Hypertension   . Seizures (HCC)   . Stroke [facility])    Past Surgical History:  Past Surgical History:  Procedure Laterality Date  . none     HPI:  Pt is a 69 y.o. male with medical history significant of seizure disorder, diabetes mellitus, hypertension, , afib on coumadin, LA thrombus, stroke and cocaine abuse was brought in by daughter who reports about 5 days ago she received a call from her mother that stated patient had passed out.  The daughter told the mother to call EMS but they never did.  Per daughter, patient had not been eating or drinking much and sleeping in bed "a lot".  Pt exhibited Seizure in the ED; pt is Covid19 Positive.  CXR: "Cardiomegaly with prominent bibasilar interstitial markings which may reflext edema or atypical/viral infection".  Family want to take the patient home with Hospice.    Assessment / Plan / Recommendation Clinical Impression  Pt appears to present w/ grossly adequate oropharyngeal phase swallow function w/ No overt oropharyngeal phase dysphagia noted, No overt neuromuscular deficits noted.Pt consumed po trials given w/ No overt, clinical s/s of aspiration during po trials. Pt appears at reduced risk for aspiration following general aspiration precautions. However, pt is Edentulous w/ pt is Edentulous, weak, requires support feeding, and w/ min declined Cognitive status which can increase risk for aspiration for a pt. During po trials, pt consumed all consistencies w/ no overt coughing, decline in vocal quality, or change in respiratory presentation during/post trials. Oral  phase appeared [facility] w/ timely bolus management and control of bolus propulsion for A-P transfer for swallowing. Min more Time needed for mashing/gumming of increased texture trials d/t Edentulous status and attention to boluses. Given Time and Moistening boluses, oral clearing achieved w/ all trial consistencies. OM Exam appeared grossly [facility] w/ no unilateral weakness noted. Speech mumbled w/ low volume; cues needed for follow through. Pt fed self holding the Cup to drink via Straw but needed assistance w/ setup and feeding support. Recommend a MINCED consistency diet w/ moistened foods; Thin liquids. Recommend general aspiration precautions, Pills Whole vs Crushed in Puree for safer, easier swallowing. Education given on Pills in Puree; food consistencies and easy to eat options; general aspiration precautions. NSG updated and to reconsult if any new needs arise. NSG agreed.  SLP Visit Diagnosis: Dysphagia, unspecified (R13.10) (pt is Edentulous; weak; declined Cognitive status)    Aspiration Risk  Mild aspiration risk;Risk for inadequate nutrition/hydration (reduced w/ precautions)    Diet Recommendation  Dysphagia level 2 (MINCED foods d/t Edentulous status; gravies to moisten); Thin liquids. General aspiration precautions; support at all meals w/ feeding  Medication Administration: Whole meds with puree (vs Crushed as needed)    Other  Recommendations Recommended Consults:  (Dietician f/u; Palliative Care following) Oral Care Recommendations: Oral care BID;Oral care before and after PO;Staff/trained caregiver to provide oral care Other Recommendations:  (n/a)   Follow up Recommendations None      Frequency and Duration  (n/a)   (n/a)       Prognosis Prognosis for Safe Diet Advancement: Fair      Swallow Study  General Date of Onset: 10/16/19 HPI: Pt is a 69 y.o. male with medical history significant of seizure disorder, diabetes mellitus, hypertension, , afib on coumadin, LA thrombus,  stroke and cocaine abuse was brought in by daughter who reports about 5 days ago she received a call from her mother that stated patient had passed out.  The daughter told the mother to call EMS but they never did.  Per daughter, patient had not been eating or drinking much and sleeping in bed "a lot".  Pt exhibited Seizure in the ED; pt is Covid19 Positive.  CXR: "Cardiomegaly with prominent bibasilar interstitial markings which may reflext edema or atypical/viral infection".  Family want to take the patient home with Hospice.  Type of Study: Bedside Swallow Evaluation Previous Swallow Assessment: none reported Diet Prior to this Study: Dysphagia 3 (soft);Thin liquids Temperature Spikes Noted: No (wbc 3.7) Respiratory Status: Room air History of Recent Intubation: No Behavior/Cognition: Alert;Cooperative;Pleasant mood;Confused;Distractible;Requires cueing Oral Cavity Assessment: Dry Oral Care Completed by SLP: Yes Oral Cavity - Dentition: Edentulous Vision: Functional for self-feeding Self-Feeding Abilities: Able to feed self;Needs assist;Needs set up;Total assist (held Cup) Patient Positioning: Upright in bed (needed complete positioning) Baseline Vocal Quality: Low vocal intensity (mumbled/muttered speech) Volitional Cough: Cognitively unable to elicit Volitional Swallow: Unable to elicit    Oral/Motor/Sensory Function Overall Oral Motor/Sensory Function: Within functional limits (grossly)   Ice Chips Ice chips: Within functional limits Presentation: Spoon (fed; 3 trials)   Thin Liquid Thin Liquid: Within functional limits Presentation: Cup;Self Fed;Straw (supported; ~6 ozs total of chocolate milk, water)    Nectar Thick Nectar Thick Liquid: Not tested   Honey Thick Honey Thick Liquid: Not tested   Puree Puree: Within functional limits Presentation: Spoon (fed; ~3 ozs)   Solid     Solid: Impaired Presentation: Spoon (fed; 3 trials) Oral Phase Impairments: Impaired mastication  (edentulous) Pharyngeal Phase Impairments:  (none)        Jerilynn Som, MS, CCC-SLP Speech Language Pathologist Rehab Services [phone] Lennis Rader 10/19/2019,10:43 AM

## 2019-10-19 NOTE — Consult Note (Signed)
ANTICOAGULATION CONSULT NOTE  Pharmacy Consult for Warfarin/Lovenox Bridge Indication: atrial fibrillation/LAA clot  No Known Allergies  Patient Measurements: Height: 5\' 6"  (167.6 cm) Weight: 67 kg (147 lb 11.3 oz) IBW/kg (Calculated) : 63.8  Vital Signs: Temp: 97 F (36.1 C) (09/04 0755) Temp Source: Axillary (09/04 0755) BP: 172/115 (09/04 0755) Pulse Rate: 73 (09/04 0755)  Labs: Recent Labs    10/16/19 1420 10/16/19 1420 10/17/19 1027 10/17/19 1027 10/18/19 0522 10/19/19 0622  HGB 15.2   < > 15.5   < > 16.1 15.4  HCT 48.1   < > 47.5  --  52.3* 48.0  PLT 117*   < > 117*  --  113* 146*  LABPROT 14.5   < > 13.3  --  16.4* 17.1*  INR 1.2   < > 1.1  --  1.4* 1.5*  CREATININE 1.62*   < > 1.44*  --  1.63* 1.49*  CKTOTAL 374  --   --   --   --   --    < > = values in this interval not displayed.    Estimated Creatinine Clearance: 42.2 mL/min (A) (by C-G formula based on SCr of 1.49 mg/dL (H)).   Medical History: Past Medical History:  Diagnosis Date  . Diabetes mellitus without complication (HCC)   . Hypertension   . Seizures (HCC)   . Stroke [facility])     Medications:  Pt reported home dose of warfarin is 7.5mg  daily, with last dosing currently unknown and admission INR of 1.2.  Pt was started on phenytoin for seizures since admission, which can enhance anticoagulant effect of warfarin, but has since been changed to Keppra (on 9/2), which has no documented interaction with warfarin.  Assessment: 69 yo male with hx of coagulation disorder, hx of CVA, permanent a fib, hx of LA thrombus and cocaine use with recent seizures (including in ER triage) and new COVID infection.  Pharmacy has been consulted to resume and monitor home warfarin therapy w/therapeutic enoxaparin for bridging.    Date INR Warfarin dose  9/1 1.2 --  9/2 1.1 7.5mg   9/3 1.4 7.5mg  (not administered)  9/4 1.5 7.5 mg   Goal of Therapy:  INR 2-3 Monitor platelets by anticoagulation protocol:  Yes   Plan:   WARFARIN:   INR subtherapeutic (dose not administered 9/3): warfarin 7.5 mg today  INR in am to guide dose  CBC daily per protocol  ENOXAPARIN:  Continue therapeutic enoxaparin at 1 mg/kg q12 hours until INR therapeutic x 2.    11/3, PharmD, BCPS Clinical Pharmacist 10/19/2019 9:14 AM

## 2019-10-19 NOTE — TOC Progression Note (Addendum)
Transition of Care [facility]) - Progression Note    Patient Details  Name: Lee Gonzalez MRN: [id number] Date of Birth: [birth year]  Transition of Care [facility]) CM/SW Contact  Lee Gonzalez [place], [place] Phone Number: [phone] 10/19/2019, 4:22 PM  Clinical Narrative:    Phone call to patient to discuss referral for Home Hospice.  Per patient's daughter Lee Gonzalez, patient will discharge to her home for Hospice care. Address [address] [place], [place] [address] [phone]   Phone call made to [facility] to complete referral. Spoke with  Lee Gonzalez, H&P, [facility] faxed for their review.They will also need the discharge orders when available.  Per Lee Gonzalez, due to the holiday weekend, patient will not be able to seen until Tuesday. Patient's daughter and provider aware.  It was confirmed that they will order DME as well.   [address] LCSW Transition of Care [phone]    Expected Discharge Plan: Skilled Nursing Facility Barriers to Discharge: Continued Medical Work up  Expected Discharge Plan and Services Expected Discharge Plan: Skilled Nursing Facility   Discharge Planning Services: CM Consult Post Acute Care Choice: Skilled Nursing Facility Living arrangements for the past 2 months: Single Family Home                                       Social Determinants of Health (SDOH) Interventions    Readmission Risk Interventions No flowsheet data found.

## 2019-10-19 NOTE — Progress Notes (Addendum)
PROGRESS NOTE    Lee Gonzalez  AYT:[id number] DOB: [birth year] DOA: 10/16/2019 PCP: [facility]   Chief Complaint  Patient presents with  . Seizures    Brief Narrative:  HPI per Dr. Cleon Gustin is a 69 y.o. male with medical history significant of seizure disorder, diabetes mellitus, hypertension, , afib on coumadin, LA thrombus, stroke and cocaine abuse was brought in by daughter who reports about 5 days ago she received a call from her mother that stated patient had passed out.  The daughter told the mother to call EMS but they never did.  Per daughter patient had not been eating or drinking much and in bed.  Sleeping a lot.  While in the ER patient started seizing in the triage room and per nursing it lasted about 1 minute and he was postictal.  Patient then was responding to verbal stimuli at that time.  Unfortunately patient was not very interactive with physical exam for me refused to answer my questions.  Please note information was taken from ER note and also I called the daughter about patient's status.   ED Course:  In the ER he was given phenytoin.  He was also CT of the head that was negative for acute intracranial abnormality.  He was found with Covid positive test.  Apparently patient lives with his wife who was diagnosed and admitted to [facility] with coronavirus about a week ago.  He does have history of cocaine use and his urine toxicology is pending.  His creatinine is 1.62 from baseline of 1.23.  Glucose 184.  BNP 283.7, CK total 374.  Pt will be admitted for witnessed seizure.  Assessment & Plan:   Active Problems:   Thrombus of left atrial appendage   Seizure (HCC)   AKI (acute kidney injury) (HCC)   Dehydration   Acute metabolic encephalopathy   Fibrinolysis (HCC)   Insulin-requiring or dependent type II diabetes mellitus (HCC)   Failure to thrive in adult   Hypophosphatemia   AF (paroxysmal atrial  fibrillation) (HCC)   COVID-19 virus infection   CVA (cerebral vascular accident) [facility]): Subacute   Goals of care, counseling/discussion   Palliative care by specialist   DNR (do not resuscitate)  1 seizure disorder/breakthrough seizure Patient presented with decreased oral intake, syncopal episode at home several days prior to admission, increased sleepiness and noted to have a witnessed seizure in the ED lasting about a minute and noted to be postictal at that time.  Head CT done negative for any acute intracranial abnormalities.  COVID-19 PCR positive.  Patient with history of seizure disorder and per admitting physician daughter stated usually develops seizures when he is sick.  Patient currently on seizure precautions, aspiration precautions.  Patient loaded with phenytoin and phenytoin ordered per pharmacy.  UDS positive for cocaine.  Consulted neurology  and per their note patient not on AED epileptic drugs at home.  Neurology recommending routine EEG which was done which was negative for any epileptiform discharges.  MRI brain concerning for subacute CVA.  Patient with no further seizures.  Patient has been changed from Dilantin to Keppra per neurology.  Continue seizure precautions.  Neurology following and appreciate input and recommendations.  2.  Subacute CVA Noted on MRI brain.  Stroke work-up ordered and underway.  Carotid Dopplers done with no significant ICA stenosis.  2D echo pending..  Patient placed on full dose Lovenox as INR was subtherapeutic prior to admission.  PT/OT/ST.  Palliative care following and decision made to transition to full comfort measures.  Neurology following.  3.  Acute metabolic encephalopathy likely secondary to subacute CVA Patient noted with some confusion however difficult to ascertain as patient in the airborne room which is pretty loud.  Patient per RN this morning was difficult to arouse and assess patient did not receive any of oral morning medications.   At the time of my examination patient was arousable, communicative, denied any chest pain or shortness of breath.   Concern that acute metabolic encephalopathy may be secondary to problem #1 and #2.  Patient noted to have a history of A. fib, LAA thrombus on Coumadin noted on admission to have a subtherapeutic INR of 1.2.  UDS on admission noted to be positive for cocaine.  Head CT done negative.  MRI brain concerning for subacute CVA.  EEG done with no epileptiform discharges noted.  Urinalysis unremarkable.  Patient on full dose Lovenox due to subtherapeutic INR.  Palliative care consulted and decision made to be transition to full comfort measures.  4.  COVID-19 infection Patient wife noted to be diagnosed with COVID-19 approximately a week ago and admitted to [facility].  Chest x-ray done with cardiomegaly with prominent bibasilar interstitial markings which may reflect mild interstitial edema versus atypical viral infection.  LDH this morning was 295, ferritin at 464, procalcitonin 0.71, fibrin derivatives currently at 5279 from > 7,500 from > 10000.  Patient not hypoxic sats of 94-98% on room air.  Status post REGEN-COV x 1.  Supportive care.  If patient becomes hypoxic, with worsening fibrinogen derivatives despite full dose Lovenox will place on IV Solu-Medrol and IV remdesivir.  Follow inflammatory markers.  Family transitioning to full comfort measures.  Follow.  5.  Elevated fibrin derivatives Fibrin derivatives > 10,000.  Patient with history of A. fib, LAA thrombus on Coumadin however on presentation INR subtherapeutic.  Coumadin likely poorly controlled.  Lower extremity Dopplers negative for DVT.  Fibrin derivatives trending down.  Patient not hypoxic.  Patient being transitioned to full comfort measures.  Follow.    6.  History of A. fib with LAA thrombus INR subtherapeutic.  Patient on Coumadin.  Coumadin poorly managed.  Continue current full dose Lovenox.  Patient being  transitioned to full comfort measures.  Will discontinue Coumadin per pharmacy and continue Lovenox.  Discussed with cardiology.  7.  Hypophosphatemia Repleted.  8.  Acute kidney injury on chronic kidney disease stage III Likely secondary to prerenal azotemia secondary to poor oral intake in the presence of diuretics.  Diuretics on hold.  Patient being gently hydrated.  Renal function fluctuating and creatinine currently at 1.49.  Continue to hold diuretics.  Saline lock IV fluids.    9.  History of cocaine use/polysubstance abuse UDS positive for cocaine.  Polysubstance cessation.  TOC consulted.  10.  Insulin-dependent diabetes mellitus Patient with poor oral intake.  Hemoglobin A1c 8.0.  CBG noted to be 133 this morning.  Resume sliding scale insulin.  Place on half home regimen of Lantus 15 units daily.  Patient being transitioned to comfort measures.  Follow.  11.  Hypertension Patient did not receive antihypertensive medications this morning as per RN patient unarousable.  During my examination patient was arousable.  Will change oral Lopressor to IV Lopressor.  Resume home regimen Norvasc.  Follow.  12.  Failure to thrive Patient noted to have poor oral intake prior to admission.  Patient with no oral intake per RN.  Patient seen by palliative care will discuss with family and decision has been made to transition to comfort measures and likely home with hospice.  Appreciate palliative care input and recommendations.     DVT prophylaxis: Lovenox Code Status: DNR Family Communication: Updated patient.  Disposition:   Status is: Inpatient    Dispo: The patient is from: Home              Anticipated d/c is to: Home with hospice following.               Anticipated d/c date is: Hopefully tomorrow.              Patient currently being treated for COVID-19 infection, neurology evaluating for seizures.  Patient with subacute CVA noted on MRI brain.  On full dose Lovenox.  Not stable  for discharge.       Consultants:   Neurology: Dr.Aroor 10/17/2019  Palliative care: Nelda SevereShae Schaffer, NP 10/18/2019  Procedures:  CT head 10/16/2019  Chest x-ray 10/16/2019  EEG 10/17/2019  MRI brain 10/17/2019  Carotid Dopplers 10/18/2019  Lower extremity Dopplers 10/17/2019  2D echo pending  Antimicrobials:   None   Subjective: Patient sleeping but arousable.  Alert.  No further seizures noted.  Patient denies any chest pain.  No shortness of breath.   Objective: Vitals:   10/19/19 0000 10/19/19 0520 10/19/19 0600 10/19/19 0755  BP: (!) 168/95 (!) 177/81 (!) 127/95 (!) 172/115  Pulse: 61 81 62 73  Resp: 18 16 (!) 27 (!) 22  Temp: 98.2 F (36.8 C) 97.9 F (36.6 C)  (!) 97 F (36.1 C)  TempSrc: Axillary Axillary  Axillary  SpO2: 99% 98% 98% 90%  Weight:      Height:        Intake/Output Summary (Last 24 hours) at 10/19/2019 1056 Last data filed at 10/18/2019 1833 Gross per 24 hour  Intake 1470.07 ml  Output 350 ml  Net 1120.07 ml   Filed Weights   10/16/19 1413 10/16/19 1416  Weight: 67 kg 67 kg    Examination:  General exam: NAD Respiratory system: Lungs clear to auscultation bilaterally.  No wheezes, no crackles, no rhonchi.  Normal respiratory effort.  Cardiovascular system: Irregularly irregular.  No murmurs rubs or gallops.  No JVD.  No lower extremity edema.   Gastrointestinal system: Abdomen is nontender, nondistended, soft, positive bowel sounds.  No rebound.  No guarding.  Central nervous system: Sleeping but arousable..  Moving extremities spontaneously.  Extremities: Symmetric 5 x 5 power. Skin: No rashes, lesions or ulcers Psychiatry: Judgement and insight appear poor to fair. Mood & affect appropriate.     Data Reviewed: I have personally reviewed following labs and imaging studies  CBC: Recent Labs  Lab 10/16/19 1420 10/17/19 1027 10/18/19 0522 10/19/19 0622  WBC 4.9 5.1 8.3 3.7*  NEUTROABS  --  4.4 7.1 2.8  HGB 15.2 15.5 16.1 15.4  HCT  48.1 47.5 52.3* 48.0  MCV 88.9 88.0 92.6 88.2  PLT 117* 117* 113* 146*    Basic Metabolic Panel: Recent Labs  Lab 10/16/19 1420 10/17/19 1027 10/18/19 0522 10/19/19 0622  NA 138 139 139 142  K 4.5 4.0 4.7 4.5  CL 101 102 103 105  CO2 19* 26 20* 26  GLUCOSE 184* 157* 161* 145*  BUN 36* 28* 24* 28*  CREATININE 1.62* 1.44* 1.63* 1.49*  CALCIUM 8.9 8.8* 8.8* 8.9  MG  --  2.1 2.0 2.1  PHOS  --  1.8* 3.7  2.5    GFR: Estimated Creatinine Clearance: 42.2 mL/min (A) (by C-G formula based on SCr of 1.49 mg/dL (H)).  Liver Function Tests: Recent Labs  Lab 10/16/19 1420 10/17/19 1027 10/18/19 0522 10/19/19 0622  AST 40 38 38 35  ALT ALKPHOS 55 55 52 53  BILITOT 1.3* 1.2 1.4* 0.9  PROT 7.1 7.3 6.9 7.0  ALBUMIN 3.4* 3.5 3.1* 3.1*    CBG: Recent Labs  Lab 10/18/19 0832 10/18/19 1146 10/18/19 1636 10/18/19 2010 10/19/19 0757  GLUCAP 180* 167* 121* 109* 133*     Recent Results (from the past 240 hour(s))  SARS Coronavirus 2 by RT PCR (hospital order, performed in [facility] hospital lab) Nasopharyngeal Nasopharyngeal Swab     Status: Abnormal   Collection Time: 10/16/19  3:35 PM   Specimen: Nasopharyngeal Swab  Result Value Ref Range Status   SARS Coronavirus 2 POSITIVE (A) NEGATIVE Final    Comment: RESULT CALLED TO, READ BACK BY AND VERIFIED WITH: MITCH BARKER  10/16/19 MJU (NOTE) SARS-CoV-2 target nucleic acids are DETECTED  SARS-CoV-2 RNA is generally detectable in upper respiratory specimens  during the acute phase of infection.  Positive results are indicative  of the presence of the identified virus, but do not rule out bacterial infection or co-infection with other pathogens not detected by the test.  Clinical correlation with patient history and  other diagnostic information is necessary to determine patient infection status.  The expected result is negative.  Fact Sheet for Patients:    BoilerBrush.com.cy   Fact Sheet for Healthcare Providers:   https://pope.com/    This test is not yet approved or cleared by the [place] FDA and  has been authorized for detection and/or diagnosis of SARS-CoV-2 by FDA under an Emergency Use Authorization (EUA).  This EUA will remain in effect (meaning this test ca n be used) for the duration of  the COVID-19 declaration under Section 564(b)(1) of the Act, 21 U.S.C. section 360-bbb-3(b)(1), unless the authorization is terminated or revoked sooner.  Performed at [facility], [address] [place], [place] [address]   Urine Culture     Status: Abnormal   Collection Time: 10/16/19  7:51 PM   Specimen: Urine, Random  Result Value Ref Range Status   Specimen Description   Final    URINE, RANDOM Performed at [facility], [address] [place], [place] [address]    Special Requests   Final    NONE Performed at [facility], [address] [place], [place] [address]    Culture MULTIPLE SPECIES PRESENT, SUGGEST RECOLLECTION (A)  Final   Report Status 10/18/2019 FINAL  Final         Radiology Studies: EEG  Result Date: 10/17/2019 Charlsie Quest, MD     10/17/2019  3:22 PM Patient Name: Demetry Bendickson MRN: [id number] Epilepsy Attending: Charlsie Quest Referring Physician/Provider: Dr. Georgiana Spinner Aroor Date: 10/17/2018 Duration: 23.08 minutes Patient history: 69 year old male with history of seizures not on any AEDs presented with generalized tonic-clonic seizure.  EEG to evaluate for seizures. Level of alertness: lethargic AEDs during EEG study: Keppra Technical aspects: This EEG study was done with scalp electrodes positioned according to the 10-20 International system of electrode placement. Electrical activity was acquired at a sampling rate of  and reviewed with a high frequency filter of  and a low frequency filter of . EEG data  were recorded continuously and digitally stored. Description: No posterior  dominant rhythm was seen.  EEG showed continuous generalized 3-5 theta-delta slowing.  Hyperventilation and photic stimulation were not performed.   ABNORMALITY -Continuous slow, generalized IMPRESSION: This study is suggestive of moderate to severe diffuse encephalopathy, nonspecific to etiology.  No seizures or epileptiform discharges were seen throughout the recording. Charlsie Quest   MR BRAIN WO CONTRAST  Result Date: 10/18/2019 CLINICAL DATA:  Not sick brain injury. EXAM: MRI HEAD WITHOUT CONTRAST TECHNIQUE: Multiplanar, multiecho pulse sequences of the brain and surrounding structures were obtained without intravenous contrast. COMPARISON:  Head CT from 2 days ago and brain MRI 06/15/2015 FINDINGS: Brain: Mildly diffusion hyperintense and ADC isointense abnormality along the left para median genu of the corpus callosum. Patchy remote cortical infarcts along the left frontal and parietal cortex. Remote perforator infarct at the left corona radiata. Chronic lacunar infarct at the left thalamus. No acute hemorrhage, hydrocephalus, or masslike finding. There is significant motion artifact. Vascular: Preserved flow voids with intracranial vascular tortuosity. Skull and upper cervical spine: Negative for marrow lesion Sinuses/Orbits: Negative. 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 IMPRESSION: 1. 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 2. Background of chronic ischemic injury and age advanced atrophy. 3. Significant motion degradation. Electronically Signed   By: Marnee Spring M.D.   On: 10/18/2019 05:23   US Carotid Bilateral (at [facility] and [facility] only)  Result Date: 10/19/2019 CLINICAL DATA:  Stroke-like symptoms EXAM: BILATERAL CAROTID DUPLEX ULTRASOUND TECHNIQUE: Wallace Cullens scale imaging, color Doppler and duplex ultrasound were performed of bilateral carotid and vertebral arteries in the  neck. COMPARISON:  None. FINDINGS: Criteria: Quantification of carotid stenosis is based on velocity parameters that correlate the residual internal carotid diameter with NASCET-based stenosis levels, using the diameter of the distal internal carotid lumen as the denominator for stenosis measurement. The following velocity measurements were obtained: RIGHT ICA: 102/12 cm/sec CCA: 70/6 cm/sec SYSTOLIC ICA/CCA RATIO:  1.4 ECA:  42 cm/sec LEFT ICA: 93/19 cm/sec CCA: 70/6 cm/sec SYSTOLIC ICA/CCA RATIO:  1.3 ECA:  95 cm/sec RIGHT CAROTID ARTERY: Mild heterogeneous atherosclerotic plaque in the proximal internal carotid artery without evidence of stenosis. Estimated stenosis remains less than 50% by peak systolic velocity criteria. RIGHT VERTEBRAL ARTERY:  Patent with normal antegrade flow. LEFT CAROTID ARTERY: Mild heterogeneous atherosclerotic plaque in the proximal internal carotid artery. By peak systolic velocity criteria, the estimated stenosis remains less than 50%. LEFT VERTEBRAL ARTERY:  Patent with normal antegrade flow. IMPRESSION: 1. Mild (1-49%) stenosis proximal right internal carotid artery secondary to heterogenous atherosclerotic plaque. 2. Mild (1-49%) stenosis proximal left internal carotid artery secondary to heterogenous atherosclerotic plaque. 3. Vertebral arteries are patent with normal antegrade flow. Signed, Sterling Big, MD, RPVI [facility] Electronically Signed   By: Malachy Moan M.D.   On: 10/19/2019 09:26   US Venous Img Lower Bilateral (DVT)  Result Date: 10/18/2019 CLINICAL DATA:  69 year old male with COVID-19, possible DVT EXAM: BILATERAL LOWER EXTREMITY VENOUS DOPPLER ULTRASOUND TECHNIQUE: Gray-scale sonography with graded compression, as well as color Doppler and duplex ultrasound were performed to evaluate the lower extremity deep venous systems from the level of the common femoral vein and including the common  femoral, femoral, profunda femoral, popliteal and calf veins including the posterior tibial, peroneal and gastrocnemius veins when visible. The superficial great saphenous vein was also interrogated. Spectral Doppler was utilized to evaluate flow at rest and with distal augmentation maneuvers in the common femoral,  femoral and popliteal veins. COMPARISON:  None. FINDINGS: RIGHT LOWER EXTREMITY Common Femoral Vein: No evidence of thrombus. Normal compressibility, respiratory phasicity and response to augmentation. Saphenofemoral Junction: No evidence of thrombus. Normal compressibility and flow on color Doppler imaging. Profunda Femoral Vein: No evidence of thrombus. Normal compressibility and flow on color Doppler imaging. Femoral Vein: No evidence of thrombus. Normal compressibility, respiratory phasicity and response to augmentation. Popliteal Vein: No evidence of thrombus. Normal compressibility, respiratory phasicity and response to augmentation. Calf Veins: No evidence of thrombus. Normal compressibility and flow on color Doppler imaging. Superficial Great Saphenous Vein: No evidence of thrombus. Normal compressibility. Venous Reflux:  None. Other Findings:  None. LEFT LOWER EXTREMITY Common Femoral Vein: No evidence of thrombus. Normal compressibility, respiratory phasicity and response to augmentation. Saphenofemoral Junction: No evidence of thrombus. Normal compressibility and flow on color Doppler imaging. Profunda Femoral Vein: No evidence of thrombus. Normal compressibility and flow on color Doppler imaging. Femoral Vein: No evidence of thrombus. Normal compressibility, respiratory phasicity and response to augmentation. Popliteal Vein: No evidence of thrombus. Normal compressibility, respiratory phasicity and response to augmentation. Calf Veins: No evidence of thrombus. Normal compressibility and flow on color Doppler imaging. Superficial Great Saphenous Vein: No evidence of thrombus. Normal  compressibility. Venous Reflux:  None. Other Findings:  None. IMPRESSION: No evidence of deep venous thrombosis. Electronically Signed   By: Malachy Moan M.D.   On: 10/18/2019 08:03        Scheduled Meds: . vitamin C  500 mg Oral Daily  . atorvastatin  40 mg Oral Daily  . citalopram  10 mg Oral Daily  . docusate sodium  100 mg Oral BID  . enoxaparin (LOVENOX) injection  1 mg/kg Subcutaneous Q12H  . folic acid  1 mg Oral Daily  . loratadine  10 mg Oral Daily  . metoprolol tartrate  5 mg Intravenous Q8H  . multivitamin with minerals  1 tablet Oral Daily  . senna  1 tablet Oral BID  . thiamine  100 mg Oral Daily  . warfarin  7.5 mg Oral ONCE-1600  . Warfarin - Pharmacist Dosing Inpatient   Does not apply q1600  . zinc sulfate  220 mg Oral Daily   Continuous Infusions: . sodium chloride    . famotidine (PEPCID) IV    . levETIRAcetam 1,000 mg (10/19/19 0115)     LOS: 2 days    Time spent: 35 minutes    Ramiro Harvest, MD [facility]   To contact the attending provider between 7A-7P or the covering provider during after hours 7P-7A, please log into the web site [email or web address] and access using universal [place] password for that web site. If you do not have the password, please call the hospital operator.  10/19/2019, 10:56 AM

## 2019-10-19 NOTE — Progress Notes (Signed)
OT Cancellation Note  Patient Details Name: Lee Gonzalez MRN: [id number] DOB: March 20, 1950   Cancelled Treatment:    Reason Eval/Treat Not Completed: Patient not medically ready. Chart reviewed, palliative care notably stating family interested in shifting to comfort care. Will hold services until goals further established by family.   Kathie Dike, M.S. OTR/L  10/19/19, 8:20 AM  ascom [phone]

## 2019-10-20 ENCOUNTER — Inpatient Hospital Stay (HOSPITAL_COMMUNITY)
Admit: 2019-10-20 | Discharge: 2019-10-20 | Disposition: A | Discharge status: Home / Self Care | Payer: Medicare Other | Attending: Internal Medicine | Admitting: Internal Medicine

## 2019-10-20 DIAGNOSIS — I639 Cerebral infarction, unspecified: Secondary | ICD-10-CM

## 2019-10-20 DIAGNOSIS — I6389 Other cerebral infarction: Secondary | ICD-10-CM

## 2019-10-20 DIAGNOSIS — R931 Abnormal findings on diagnostic imaging of heart and coronary circulation: Secondary | ICD-10-CM

## 2019-10-20 LAB — ECHOCARDIOGRAM COMPLETE
AR max vel: 2.43 cm2
AV Area VTI: 2.52 cm2
AV Area mean vel: 2.5 cm2
AV Mean grad: 1 mmHg
AV Peak grad: 2 mmHg
Ao pk vel: 0.71 m/s
Area-P 1/2: 2.99 cm2
Calc EF: 41.9 %
Height: 66 in
S' Lateral: 3.76 cm
Single Plane A2C EF: 26.7 %
Single Plane A4C EF: 54.6 %
Weight: 2363.33 oz

## 2019-10-20 LAB — CBC WITH DIFFERENTIAL/PLATELET
Abs Immature Granulocytes: 0.02 10*3/uL (ref 0.00–0.07)
Basophils Absolute: 0 10*3/uL (ref 0.0–0.1)
Basophils Relative: 0 %
Eosinophils Absolute: 0 10*3/uL (ref 0.0–0.5)
Eosinophils Relative: 1 %
HCT: 50.7 % (ref 39.0–52.0)
Hemoglobin: 16.4 g/dL (ref 13.0–17.0)
Immature Granulocytes: 1 %
Lymphocytes Relative: 21 %
Lymphs Abs: 0.6 10*3/uL — ABNORMAL LOW (ref 0.7–4.0)
MCH: 28.2 pg (ref 26.0–34.0)
MCHC: 32.3 g/dL (ref 30.0–36.0)
MCV: 87.1 fL (ref 80.0–100.0)
Monocytes Absolute: 0.4 10*3/uL (ref 0.1–1.0)
Monocytes Relative: 12 %
Neutro Abs: 2 10*3/uL (ref 1.7–7.7)
Neutrophils Relative %: 65 %
Platelets: 170 10*3/uL (ref 150–400)
RBC: 5.82 MIL/uL — ABNORMAL HIGH (ref 4.22–5.81)
RDW: 13.7 % (ref 11.5–15.5)
WBC: 3 10*3/uL — ABNORMAL LOW (ref 4.0–10.5)
nRBC: 0 % (ref 0.0–0.2)

## 2019-10-20 LAB — GLUCOSE, CAPILLARY
Glucose-Capillary: 82 mg/dL (ref 70–99)
Glucose-Capillary: 80 mg/dL (ref 70–99)
Glucose-Capillary: 62 mg/dL — ABNORMAL LOW (ref 70–99)
Glucose-Capillary: 75 mg/dL (ref 70–99)
Glucose-Capillary: 121 mg/dL — ABNORMAL HIGH (ref 70–99)
Glucose-Capillary: 250 mg/dL — ABNORMAL HIGH (ref 70–99)

## 2019-10-20 LAB — COMPREHENSIVE METABOLIC PANEL
ALT: 14 U/L (ref 0–44)
AST: 33 U/L (ref 15–41)
Albumin: 3.2 g/dL — ABNORMAL LOW (ref 3.5–5.0)
Alkaline Phosphatase: 59 U/L (ref 38–126)
Anion gap: 12 (ref 5–15)
BUN: 22 mg/dL (ref 8–23)
CO2: 25 mmol/L (ref 22–32)
Calcium: 8.9 mg/dL (ref 8.9–10.3)
Chloride: 105 mmol/L (ref 98–111)
Creatinine, Ser: 1.03 mg/dL (ref 0.61–1.24)
GFR calc Af Amer: >60 mL/min (ref >60)
GFR calc non Af Amer: >60 mL/min (ref >60)
Glucose, Bld: 83 mg/dL (ref 70–99)
Potassium: 3.6 mmol/L (ref 3.5–5.1)
Sodium: 142 mmol/L (ref 135–145)
Total Bilirubin: 0.7 mg/dL (ref 0.3–1.2)
Total Protein: 7.3 g/dL (ref 6.5–8.1)

## 2019-10-20 LAB — MAGNESIUM: Magnesium: 1.9 mg/dL (ref 1.7–2.4)

## 2019-10-20 LAB — PHOSPHORUS: Phosphorus: 2.3 mg/dL — ABNORMAL LOW (ref 2.5–4.6)

## 2019-10-20 LAB — FERRITIN: Ferritin: 636 ng/mL — ABNORMAL HIGH (ref 24–336)

## 2019-10-20 LAB — C-REACTIVE PROTEIN: CRP: 9.1 mg/dL — ABNORMAL HIGH (ref <1.0)

## 2019-10-20 LAB — PROCALCITONIN: Procalcitonin: 0.68 ng/mL

## 2019-10-20 LAB — FIBRIN DERIVATIVES D-DIMER (ARMC ONLY): Fibrin derivatives D-dimer (ARMC): 3902.2 ng/mL (FEU) — ABNORMAL HIGH (ref 0.00–499.00)

## 2019-10-20 MED ORDER — PERFLUTREN LIPID MICROSPHERE
1.0000 mL | INTRAVENOUS | Status: AC | PRN
  Administered 2019-10-20: 3 mL via INTRAVENOUS
  Filled 2019-10-20: qty 10

## 2019-10-20 NOTE — Progress Notes (Signed)
OT Cancellation Note  Patient Details Name: Lee Gonzalez MRN: [id number] DOB: Dec 07, 1950   Cancelled Treatment:    Reason Eval/Treat Not Completed: Other (comment). Per chart review, family decision made to transition to full comfort measurese at this time.  Will complete initial orders.  Please re-consult as medically appropriate should goals of care change.  Kathie Dike, M.S. OTR/L  10/20/19, 8:35 AM  ascom [phone]

## 2019-10-20 NOTE — Plan of Care (Signed)
  Problem: Education: Goal: Knowledge of risk factors and measures for prevention of condition will improve Outcome: Progressing   Problem: Coping: Goal: Psychosocial and spiritual needs will be supported Outcome: Progressing   

## 2019-10-20 NOTE — Progress Notes (Signed)
PT Cancellation Note  Patient Details Name: Lee Gonzalez MRN: [id number] DOB: Feb 26, 1950   Cancelled Treatment:    Reason Eval/Treat Not Completed:  (Per chart review, family preferring to shift patient to comfort care at this time.  Will complete initial orders.  Please re-consult as medically appropriate should goals of care change.)   Lee Gonzalez H. Manson Passey, PT, DPT, NCS 10/20/19, 8:20 AM [phone]

## 2019-10-20 NOTE — Progress Notes (Signed)
PROGRESS NOTE    Lee Gonzalez  XBJ:[id number] DOB: [birth year] DOA: 10/16/2019 PCP: [facility]   Chief Complaint  Patient presents with  . Seizures    Brief Narrative:  HPI per Dr. Cleon GustinAmery Tarvares Lewis Gonzalez is a 69 y.o. male with medical history significant of seizure disorder, diabetes mellitus, hypertension, , afib on coumadin, LA thrombus, stroke and cocaine abuse was brought in by daughter who reports about 5 days ago she received a call from her mother that stated patient had passed out.  The daughter told the mother to call EMS but they never did.  Per daughter patient had not been eating or drinking much and in bed.  Sleeping a lot.  While in the ER patient started seizing in the triage room and per nursing it lasted about 1 minute and he was postictal.  Patient then was responding to verbal stimuli at that time.  Unfortunately patient was not very interactive with physical exam for me refused to answer my questions.  Please note information was taken from ER note and also I called the daughter about patient's status.   ED Course:  In the ER he was given phenytoin.  He was also CT of the head that was negative for acute intracranial abnormality.  He was found with Covid positive test.  Apparently patient lives with his wife who was diagnosed and admitted to [facility] with coronavirus about a week ago.  He does have history of cocaine use and his urine toxicology is pending.  His creatinine is 1.62 from baseline of 1.23.  Glucose 184.  BNP 283.7, CK total 374.  Pt will be admitted for witnessed seizure.  Assessment & Plan:   Active Problems:   Thrombus of left atrial appendage   Seizure (HCC)   AKI (acute kidney injury) (HCC)   Dehydration   Acute metabolic encephalopathy   Fibrinolysis (HCC)   Insulin-requiring or dependent type II diabetes mellitus (HCC)   Failure to thrive in adult   Hypophosphatemia   AF (paroxysmal atrial  fibrillation) (HCC)   COVID-19 virus infection   CVA (cerebral vascular accident) [facility](HCC): Subacute   Goals of care, counseling/discussion   Palliative care by specialist   DNR (do not resuscitate)  1 seizure disorder/breakthrough seizure Patient presented with decreased oral intake, syncopal episode at home several days prior to admission, increased sleepiness and noted to have a witnessed seizure in the ED lasting about a minute and noted to be postictal at that time.  Head CT done negative for any acute intracranial abnormalities.  COVID-19 PCR positive.  Patient with history of seizure disorder and per admitting physician daughter stated usually develops seizures when he is sick.  Patient currently on seizure precautions, aspiration precautions.  Patient loaded with phenytoin and phenytoin ordered per pharmacy.  UDS positive for cocaine.  Consulted neurology  and per their note patient not on AED epileptic drugs at home.  Neurology recommending routine EEG which was done which was negative for any epileptiform discharges.  MRI brain concerning for subacute CVA.  Patient with no further seizures.  Patient has been changed from Dilantin to Keppra per neurology.  Continue seizure precautions.  Likely transition to oral Keppra on discharge.  Neurology following and appreciate input and recommendations.  2.  Subacute CVA Noted on MRI brain.  Stroke work-up ordered and underway.  Carotid Dopplers done with no significant ICA stenosis.  2D echo with a EF of 30 to 35%, left ventricular global  hypokinesis, moderate asymmetric LVH, cannot rule out mass in the left atrium versus artifactual changes, rhythm is a flutter, recommend TEE to evaluate for thrombus if clinically indicated, left atrial size severely dilated, left to right atrial shunt seen, evidence of atrial level shunting detected by color flow Doppler, mild to moderate right atrial size dilatation.  Patient placed on full dose Lovenox as INR was  subtherapeutic prior to admission.  PT/OT/ST.  Palliative care following and decision made to transition to full comfort measures.  Neurology following.  3.  Abnormal 2D echo  2D echo with a EF of 30 to 35%, left ventricular global hypokinesis, moderate asymmetric LVH, cannot rule out mass in the left atrium versus artifactual changes, rhythm is a flutter, recommend TEE to evaluate for thrombus if clinically indicated, left atrial size severely dilated, left to right atrial shunt seen, evidence of atrial level shunting detected by color flow Doppler, mild to moderate right atrial size dilatation.  Patient with a depressed EF and wall motion abnormalities, compared to prior 2D echo.  Patient on full dose Lovenox which she will be discharged home on.  Palliative care has assessed patient and decision was made to transition to comfort measures with patient to go home with hospice, as such no further work-up indicated at this time.  Continue beta-blocker, resume diuretics in 1 to 2 days, risk factor modification.  4.  Acute metabolic encephalopathy likely secondary to subacute CVA Patient noted with some confusion however difficult to ascertain as patient in the airborne room which is pretty loud.  Patient per RN the morning of 10/19/2019, was difficult to arouse and as such patient did not receive any of oral morning medications.  Today patient sleepy but arousable and answers questions.  Moving extremities spontaneously.    Concern that acute metabolic encephalopathy may be secondary to problem #1 and #2.  Patient noted to have a history of A. fib, LAA thrombus on Coumadin noted on admission to have a subtherapeutic INR of 1.2.  UDS on admission noted to be positive for cocaine.  Head CT done negative.  MRI brain concerning for subacute CVA.  EEG done with no epileptiform discharges noted.  Urinalysis unremarkable.  Patient on full dose Lovenox due to subtherapeutic INR.  Palliative care consulted and decision  made to be transition to full comfort measures.  5.  COVID-19 infection Patient wife noted to be diagnosed with COVID-19 approximately a week ago and admitted to [facility].  Chest x-ray done with cardiomegaly with prominent bibasilar interstitial markings which may reflect mild interstitial edema versus atypical viral infection.  LDH this morning was 295, ferritin at 464, procalcitonin 0.71, fibrin derivatives currently at 5279 from > 7,500 from > 10000.  Patient not hypoxic sats of 94-98% on room air.  Status post REGEN-COV x 1.  Supportive care.  If patient becomes hypoxic, with worsening fibrinogen derivatives despite full dose Lovenox will place on IV Solu-Medrol and IV remdesivir.  Follow inflammatory markers.  Family transitioning to full comfort measures.  Follow.  6.  Elevated fibrin derivatives Fibrin derivatives > 10,000.  Patient with history of A. fib, LAA thrombus on Coumadin however on presentation INR subtherapeutic.  Coumadin likely poorly controlled.  Lower extremity Dopplers negative for DVT.  Fibrin derivatives trending down.  Patient not hypoxic.  Patient being transitioned to full comfort measures.  Patient on full dose Lovenox which we will continue.  Follow.    7.  History of A. fib with LAA thrombus INR  subtherapeutic.  Patient on Coumadin.  Coumadin poorly managed.  Continue current full dose Lovenox.  Patient being transitioned to full comfort measures.  Coumadin has been discontinued and patient will be discharged home on full dose Lovenox.  Discussed with cardiology.  8.  Hypophosphatemia Phosphorus at 2.3.  Patient being transitioned to comfort measures.  Monitor.  Repleted.  9.  Acute kidney injury on chronic kidney disease stage III Likely secondary to prerenal azotemia secondary to poor oral intake in the presence of diuretics.  Diuretics on hold.  Patient being gently hydrated.  Renal function fluctuating and creatinine currently at 1.03.  IV fluids have  been saline lock.  Continue to hold diuretics.  Saline lock IV fluids.    10.  History of cocaine use/polysubstance abuse UDS positive for cocaine.  Polysubstance cessation.  TOC consulted.  11.  Insulin-dependent diabetes mellitus Patient with poor oral intake.  Hemoglobin A1c 8.0.  CBG noted to be 82 this morning.  Continue half home dose Lantus 15 units daily.  Sliding scale insulin.  Patient being transitioned to comfort measures.  Her blood glucose levels remained stable in the next 24 hours we will discontinue CBG checks.   12.  Hypertension Patient did not receive antihypertensive medications yesterday as per RN patient unarousable.  Patient arousable as he was assessed by speech therapy on 10/19/2019.  We will continue IV Lopressor for now.  Continue home regimen Norvasc.  Likely transition from IV Lopressor to oral beta-blocker tomorrow.  Follow.   13.  Failure to thrive Patient noted to have poor oral intake prior to admission.  Patient with no oral intake per RN.  Patient seen by palliative care will discuss with family and decision has been made to transition to comfort measures and likely home with hospice.  Appreciate palliative care input and recommendations.     DVT prophylaxis: Lovenox Code Status: DNR Family Communication: Updated patient.  Disposition:   Status is: Inpatient    Dispo: The patient is from: Home              Anticipated d/c is to: Home with hospice following.               Anticipated d/c date is: 10/22/2019.              Patient currently being treated for COVID-19 infection, neurology has assessed the patient for seizures.  Patient with subacute CVA noted on MRI brain.  On full dose Lovenox.  Patient to go home with hospice support, which will not be available till 10/22/2019.        Consultants:   Neurology: Dr.Aroor 10/17/2019  Palliative care: Nelda Severe, NP 10/18/2019  Procedures:  CT head 10/16/2019  Chest x-ray 10/16/2019  EEG  10/17/2019  MRI brain 10/17/2019  Carotid Dopplers 10/18/2019  Lower extremity Dopplers 10/17/2019  2D echo 10/20/2019  Antimicrobials:   REGEN-COV x1 10/17/2019   Subjective: Patient asleep.  Arousable.  No chest pain.  No shortness of breath.  No seizures noted.   Objective: Vitals:   10/19/19 1638 10/19/19 2032 10/20/19 0412 10/20/19 0800  BP: (!) 156/96 (!) 149/106 (!) 172/125 (!) 184/105  Pulse: 69 67 61 (!) 58  Resp:  16 18 (!) 24  Temp: 98.6 F (37 C) (!) 97.5 F (36.4 C) (!) 97.5 F (36.4 C) 97.9 F (36.6 C)  TempSrc: Oral Oral Oral Oral  SpO2: 100% 100% 97% 98%  Weight:      Height:  Intake/Output Summary (Last 24 hours) at 10/20/2019 1153 Last data filed at 10/19/2019 2140 Gross per 24 hour  Intake 100 ml  Output --  Net 100 ml   Filed Weights   10/16/19 1413 10/16/19 1416  Weight: 67 kg 67 kg    Examination:  General exam: NAD Respiratory system: CTAB.  No wheezes, no crackles, no rhonchi.  Normal respiratory effort.  Cardiovascular system: Irregularly irregular.  No murmurs rubs or gallops.  No JVD.  No lower extremity edema.   Gastrointestinal system: Abdomen is soft, nontender, nondistended, positive bowel sounds.  No rebound.  No guarding. Central nervous system: Sleeping but arousable.  Moving extremities spontaneously.  No focal neurological deficits.  Extremities: Symmetric 5 x 5 power. Skin: No rashes, lesions or ulcers Psychiatry: Judgement and insight appear poor to fair. Mood & affect appropriate.     Data Reviewed: I have personally reviewed following labs and imaging studies  CBC: Recent Labs  Lab 10/16/19 1420 10/17/19 1027 10/18/19 0522 10/19/19 0622 10/20/19 1017  WBC 4.9 5.1 8.3 3.7* 3.0*  NEUTROABS  --  4.4 7.1 2.8 2.0  HGB 15.2 15.5 16.1 15.4 16.4  HCT 48.1 47.5 52.3* 48.0 50.7  MCV 88.9 88.0 92.6 88.2 87.1  PLT 117* 117* 113* 146* 170    Basic Metabolic Panel: Recent Labs  Lab 10/16/19 1420 10/17/19 1027  10/18/19 0522 10/19/19 0622 10/20/19 1017  NA 138 139 139 142 142  K 4.5 4.0 4.7 4.5 3.6  CL 101 102 103 105 105  CO2 19* 26 20* 26 25  GLUCOSE 184* 157* 161* 145* 83  BUN 36* 28* 24* 28* 22  CREATININE 1.62* 1.44* 1.63* 1.49* 1.03  CALCIUM 8.9 8.8* 8.8* 8.9 8.9  MG  --  2.1 2.0 2.1 1.9  PHOS  --  1.8* 3.7 2.5 2.3*    GFR: Estimated Creatinine Clearance: 61.1 mL/min (by C-G formula based on SCr of 1.03 mg/dL).  Liver Function Tests: Recent Labs  Lab 10/16/19 1420 10/17/19 1027 10/18/19 0522 10/19/19 0622 10/20/19 1017  AST 40 38 38 35 33  ALT 16 17 14 15 14   ALKPHOS 55 55 52 53 59  BILITOT 1.3* 1.2 1.4* 0.9 0.7  PROT 7.1 7.3 6.9 7.0 7.3  ALBUMIN 3.4* 3.5 3.1* 3.1* 3.2*    CBG: Recent Labs  Lab 10/19/19 0757 10/19/19 1216 10/19/19 1640 10/19/19 2112 10/20/19 0832  GLUCAP 133* 223* 189* 142* 82     Recent Results (from the past 240 hour(s))  SARS Coronavirus 2 by RT PCR (hospital order, performed in [facility] hospital lab) Nasopharyngeal Nasopharyngeal Swab     Status: Abnormal   Collection Time: 10/16/19  3:35 PM   Specimen: Nasopharyngeal Swab  Result Value Ref Range Status   SARS Coronavirus 2 POSITIVE (A) NEGATIVE Final    Comment: RESULT CALLED TO, READ BACK BY AND VERIFIED WITH: MITCH BARKER @1722  10/16/19 MJU (NOTE) SARS-CoV-2 target nucleic acids are DETECTED  SARS-CoV-2 RNA is generally detectable in upper respiratory specimens  during the acute phase of infection.  Positive results are indicative  of the presence of the identified virus, but do not rule out bacterial infection or co-infection with other pathogens not detected by the test.  Clinical correlation with patient history and  other diagnostic information is necessary to determine patient infection status.  The expected result is negative.  Fact Sheet for Patients:     Fact Sheet for Healthcare Providers:    12/16/19    This test  is not yet approved or cleared by the [place] and  has been authorized for detection and/or diagnosis of SARS-CoV-2 by FDA under an Emergency Use Authorization (EUA).  This EUA will remain in effect (meaning this test ca n be used) for the duration of  the COVID-19 declaration under Section 564(b)(1) of the Act, 21 U.S.C. section 360-bbb-3(b)(1), unless the authorization is terminated or revoked sooner.  Performed at [facility], [address] [place], [place] [address]   Urine Culture     Status: Abnormal   Collection Time: 10/16/19  7:51 PM   Specimen: Urine, Random  Result Value Ref Range Status   Specimen Description   Final    URINE, RANDOM Performed at [facility], [address] [place], [place] [address]    Special Requests   Final    NONE Performed at [facility], [address] [place], [place] [address]    Culture MULTIPLE SPECIES PRESENT, SUGGEST RECOLLECTION (A)  Final   Report Status 10/18/2019 FINAL  Final         Radiology Studies: US Carotid Bilateral (at [facility] and [facility] only)  Result Date: 10/19/2019 CLINICAL DATA:  Stroke-like symptoms EXAM: BILATERAL CAROTID DUPLEX ULTRASOUND TECHNIQUE: Wallace Cullens scale imaging, color Doppler and duplex ultrasound were performed of bilateral carotid and vertebral arteries in the neck. COMPARISON:  None. FINDINGS: Criteria: Quantification of carotid stenosis is based on velocity parameters that correlate the residual internal carotid diameter with NASCET-based stenosis levels, using the diameter of the distal internal carotid lumen as the denominator for stenosis measurement. The following velocity measurements were obtained: RIGHT ICA: 102/12 cm/sec CCA: 70/6 cm/sec SYSTOLIC ICA/CCA RATIO:  1.4 ECA:  42 cm/sec LEFT ICA: 93/19 cm/sec CCA: 70/6 cm/sec SYSTOLIC ICA/CCA RATIO:  1.3 ECA:  95 cm/sec RIGHT CAROTID ARTERY: Mild heterogeneous  atherosclerotic plaque in the proximal internal carotid artery without evidence of stenosis. Estimated stenosis remains less than 50% by peak systolic velocity criteria. RIGHT VERTEBRAL ARTERY:  Patent with normal antegrade flow. LEFT CAROTID ARTERY: Mild heterogeneous atherosclerotic plaque in the proximal internal carotid artery. By peak systolic velocity criteria, the estimated stenosis remains less than 50%. LEFT VERTEBRAL ARTERY:  Patent with normal antegrade flow. IMPRESSION: 1. Mild (1-49%) stenosis proximal right internal carotid artery secondary to heterogenous atherosclerotic plaque. 2. Mild (1-49%) stenosis proximal left internal carotid artery secondary to heterogenous atherosclerotic plaque. 3. Vertebral arteries are patent with normal antegrade flow. Signed, Sterling Big, MD, RPVI [facility] Radiology Electronically Signed   By: Malachy Moan M.D.   On: 10/19/2019 09:26        Scheduled Meds: . amLODipine  10 mg Oral Daily  . vitamin C  500 mg Oral Daily  . atorvastatin  40 mg Oral Daily  . citalopram  10 mg Oral Daily  . docusate sodium  100 mg Oral BID  . enoxaparin (LOVENOX) injection  1 mg/kg Subcutaneous Q12H  . folic acid  1 mg Oral Daily  . insulin aspart  0-9 Units Subcutaneous TID WC  . insulin glargine  15 Units Subcutaneous Daily  . loratadine  10 mg Oral Daily  . metoprolol tartrate  5 mg Intravenous Q8H  . multivitamin with minerals  1 tablet Oral Daily  . senna  1 tablet Oral BID  . thiamine  100 mg Oral Daily  . zinc sulfate  220 mg Oral Daily   Continuous Infusions: . sodium chloride    . famotidine (PEPCID)  IV    . levETIRAcetam 1,000 mg (10/20/19 0104)     LOS: 3 days    Time spent: 35 minutes    Ramiro Harvest, MD [facility]   To contact the attending provider between 7A-7P or the covering provider during after hours 7P-7A, please log into the web site [email or web address] and  access using universal [place] password for that web site. If you do not have the password, please call the hospital operator.  10/20/2019, 11:53 AM

## 2019-10-20 NOTE — TOC Progression Note (Signed)
Transition of Care [facility]) - Progression Note    Patient Details  Name: Lee Gonzalez MRN: [id number] Date of Birth: 10-30-1950  Transition of [facility]) CM/SW Contact  Allayne Butcher, RN Phone Number: 10/20/2019, 11:02 AM  Clinical Narrative:    Plan is for discharge on Tuesday to Home with Hospice.  Patient will be going to his daughter's home at [address] [place]eta [place] [place] [address].     Expected Discharge Plan: Skilled Nursing Facility Barriers to Discharge: Continued Medical Work up  Expected Discharge Plan and Services Expected Discharge Plan: Skilled Nursing Facility   Discharge Planning Services: CM Consult Post Acute Care Choice: Skilled Nursing Facility Living arrangements for the past 2 months: Single Family Home                                       Social Determinants of Health (SDOH) Interventions    Readmission Risk Interventions No flowsheet data found.

## 2019-10-20 NOTE — Progress Notes (Signed)
Interim Note:   Transthoracic Echo: EF 30 to 35%. The left ventricle  global hypokinesis. Cannut rule out mass in the left atrium vs artifactual changes on current study. patient's rhythm is atrial flutter. recommends TEE to evaluate for thrombus if clinically indicated. Left atrial size was severely dilated. 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 . Evidence of atrial level shunting detected by colorflow Doppler  No need for TEE: Patient already being anticoagulated with Warfarin for cardioembolic cause for stroke.  INR goal 2-3.   Carotid doppler: no ICA stenosis AIC: 8.5  Lipid profile: 64  Recommendations Continue Keppra 1g BID Seizure precautions and no driving x 6 months Continue Couamdin for stroke rpevention Continue to treat COVID 19 infection   Neurology follow up in 4 weeks. Please call if any questions.

## 2019-10-21 LAB — CBC WITH DIFFERENTIAL/PLATELET
Abs Immature Granulocytes: 0.02 10*3/uL (ref 0.00–0.07)
Basophils Absolute: 0 10*3/uL (ref 0.0–0.1)
Basophils Relative: 1 %
Eosinophils Absolute: 0 10*3/uL (ref 0.0–0.5)
Eosinophils Relative: 1 %
HCT: 44.7 % (ref 39.0–52.0)
Hemoglobin: 14.6 g/dL (ref 13.0–17.0)
Immature Granulocytes: 1 %
Lymphocytes Relative: 22 %
Lymphs Abs: 0.6 10*3/uL — ABNORMAL LOW (ref 0.7–4.0)
MCH: 27.9 pg (ref 26.0–34.0)
MCHC: 32.7 g/dL (ref 30.0–36.0)
MCV: 85.3 fL (ref 80.0–100.0)
Monocytes Absolute: 0.3 10*3/uL (ref 0.1–1.0)
Monocytes Relative: 12 %
Neutro Abs: 1.8 10*3/uL (ref 1.7–7.7)
Neutrophils Relative %: 63 %
Platelets: 192 10*3/uL (ref 150–400)
RBC: 5.24 MIL/uL (ref 4.22–5.81)
RDW: 13.7 % (ref 11.5–15.5)
WBC: 2.9 10*3/uL — ABNORMAL LOW (ref 4.0–10.5)
nRBC: 0 % (ref 0.0–0.2)

## 2019-10-21 LAB — MAGNESIUM: Magnesium: 1.7 mg/dL (ref 1.7–2.4)

## 2019-10-21 LAB — COMPREHENSIVE METABOLIC PANEL
ALT: 14 U/L (ref 0–44)
AST: 28 U/L (ref 15–41)
Albumin: 2.8 g/dL — ABNORMAL LOW (ref 3.5–5.0)
Alkaline Phosphatase: 57 U/L (ref 38–126)
Anion gap: 12 (ref 5–15)
BUN: 23 mg/dL (ref 8–23)
CO2: 25 mmol/L (ref 22–32)
Calcium: 8.7 mg/dL — ABNORMAL LOW (ref 8.9–10.3)
Chloride: 104 mmol/L (ref 98–111)
Creatinine, Ser: 1.01 mg/dL (ref 0.61–1.24)
GFR calc Af Amer: >60 mL/min (ref >60)
GFR calc non Af Amer: >60 mL/min (ref >60)
Glucose, Bld: 175 mg/dL — ABNORMAL HIGH (ref 70–99)
Potassium: 3.7 mmol/L (ref 3.5–5.1)
Sodium: 141 mmol/L (ref 135–145)
Total Bilirubin: 0.6 mg/dL (ref 0.3–1.2)
Total Protein: 6.7 g/dL (ref 6.5–8.1)

## 2019-10-21 LAB — FIBRIN DERIVATIVES D-DIMER (ARMC ONLY): Fibrin derivatives D-dimer (ARMC): 2894.36 ng/mL (FEU) — ABNORMAL HIGH (ref 0.00–499.00)

## 2019-10-21 LAB — PHOSPHORUS: Phosphorus: 2 mg/dL — ABNORMAL LOW (ref 2.5–4.6)

## 2019-10-21 LAB — GLUCOSE, CAPILLARY
Glucose-Capillary: 127 mg/dL — ABNORMAL HIGH (ref 70–99)
Glucose-Capillary: 154 mg/dL — ABNORMAL HIGH (ref 70–99)
Glucose-Capillary: 81 mg/dL (ref 70–99)
Glucose-Capillary: 66 mg/dL — ABNORMAL LOW (ref 70–99)
Glucose-Capillary: 70 mg/dL (ref 70–99)

## 2019-10-21 LAB — PROCALCITONIN: Procalcitonin: 0.47 ng/mL

## 2019-10-21 LAB — FERRITIN: Ferritin: 523 ng/mL — ABNORMAL HIGH (ref 24–336)

## 2019-10-21 LAB — C-REACTIVE PROTEIN: CRP: 6.9 mg/dL — ABNORMAL HIGH (ref <1.0)

## 2019-10-21 MED ORDER — POTASSIUM PHOSPHATES 15 MMOLE/5ML IV SOLN
30.0000 mmol | Freq: Once | INTRAVENOUS | Status: AC
  Administered 2019-10-21: 30 mmol via INTRAVENOUS
  Filled 2019-10-21: qty 10

## 2019-10-21 MED ORDER — MAGNESIUM SULFATE 4 GM/100ML IV SOLN
4.0000 g | Freq: Once | INTRAVENOUS | Status: AC
  Administered 2019-10-21: 4 g via INTRAVENOUS
  Filled 2019-10-21: qty 100

## 2019-10-21 MED ORDER — METOPROLOL TARTRATE 25 MG PO TABS
25.0000 mg | ORAL_TABLET | Freq: Two times a day (BID) | ORAL | Status: DC
  Administered 2019-10-21 – 2019-10-22 (×3): 25 mg via ORAL
  Filled 2019-10-21 (×3): qty 1

## 2019-10-21 MED ORDER — HYDRALAZINE HCL 20 MG/ML IJ SOLN: 10.0000 mg | Freq: Four times a day (QID) | INTRAMUSCULAR | Status: DC | PRN

## 2019-10-21 MED ORDER — FUROSEMIDE 20 MG PO TABS
20.0000 mg | ORAL_TABLET | Freq: Every day | ORAL | Status: DC
  Administered 2019-10-21 – 2019-10-22 (×2): 20 mg via ORAL
  Filled 2019-10-21 (×2): qty 1

## 2019-10-21 NOTE — Progress Notes (Signed)
Pt BG was 66 mg/dL at 0981 hrs. Pt was able to tolerate 354 ml of orange juice and bit of mashed crackers. BG re-checked at 2204 hrs, it was 70 mg/dL. Will continue to monitor BG and intervene as ordered.

## 2019-10-21 NOTE — Care Management Important Message (Signed)
Important Message  Patient Details  Name: Lee Gonzalez MRN: [id number] Date of Birth: 28-Aug-1950   Medicare Important Message Given:  Yes  Reviewed with daughter, [name].  Verbal consent given for initial Medicare IM.  Copy sent securely to email address provided after confirming: [email or web address].     Johnell Comings 10/21/2019, 2:46 PM

## 2019-10-21 NOTE — Progress Notes (Signed)
PROGRESS NOTE    Lee Gonzalez  JXB:[id number] DOB: [birth year] DOA: 10/16/2019 PCP: [facility]   Chief Complaint  Patient presents with  . Seizures    Brief Narrative:  HPI per Dr. Cleon Gustin is a 69 y.o. male with medical history significant of seizure disorder, diabetes mellitus, hypertension, , afib on coumadin, LA thrombus, stroke and cocaine abuse was brought in by daughter who reports about 5 days ago she received a call from her mother that stated patient had passed out.  The daughter told the mother to call EMS but they never did.  Per daughter patient had not been eating or drinking much and in bed.  Sleeping a lot.  While in the ER patient started seizing in the triage room and per nursing it lasted about 1 minute and he was postictal.  Patient then was responding to verbal stimuli at that time.  Unfortunately patient was not very interactive with physical exam for me refused to answer my questions.  Please note information was taken from ER note and also I called the daughter about patient's status.   ED Course:  In the ER he was given phenytoin.  He was also CT of the head that was negative for acute intracranial abnormality.  He was found with Covid positive test.  Apparently patient lives with his wife who was diagnosed and admitted to [facility] with coronavirus about a week ago.  He does have history of cocaine use and his urine toxicology is pending.  His creatinine is 1.62 from baseline of 1.23.  Glucose 184.  BNP 283.7, CK total 374.  Pt will be admitted for witnessed seizure.  Assessment & Plan:   Active Problems:   Thrombus of left atrial appendage   Seizure (HCC)   AKI (acute kidney injury) (HCC)   Dehydration   Acute metabolic encephalopathy   Fibrinolysis (HCC)   Insulin-requiring or dependent type II diabetes mellitus (HCC)   Failure to thrive in adult   Hypophosphatemia   AF (paroxysmal atrial  fibrillation) (HCC)   COVID-19 virus infection   CVA (cerebral vascular accident) [facility]): Subacute   Goals of care, counseling/discussion   Palliative care by specialist   DNR (do not resuscitate)   Abnormal echocardiogram  1 seizure disorder/breakthrough seizure Patient presented with decreased oral intake, syncopal episode at home several days prior to admission, increased sleepiness and noted to have a witnessed seizure in the ED lasting about a minute and noted to be postictal at that time.  Head CT done negative for any acute intracranial abnormalities.  COVID-19 PCR positive.  Patient with history of seizure disorder and per admitting physician daughter stated usually develops seizures when he is sick.  Patient currently on seizure precautions, aspiration precautions.  Patient loaded with phenytoin and phenytoin ordered per pharmacy.  UDS positive for cocaine.  Consulted neurology  and per their note patient not on AED epileptic drugs at home.  Neurology recommending routine EEG which was done which was negative for any epileptiform discharges.  MRI brain concerning for subacute CVA.  Patient with no further seizures.  Patient has been changed from Dilantin to Keppra per neurology.  Continue seizure precautions.  Likely transition to oral Keppra on discharge.  Neurology following and appreciate input and recommendations.  2.  Subacute CVA Noted on MRI brain.  Stroke work-up ordered and underway.  Carotid Dopplers done with no significant ICA stenosis.  2D echo with a EF of 30 to  35%, left ventricular global hypokinesis, moderate asymmetric LVH, cannot rule out mass in the left atrium versus artifactual changes, rhythm is a flutter, recommend TEE to evaluate for thrombus if clinically indicated, left atrial size severely dilated, left to right atrial shunt seen, evidence of atrial level shunting detected by color flow Doppler, mild to moderate right atrial size dilatation.  Patient placed on full  dose Lovenox as INR was subtherapeutic prior to admission.  PT/OT/ST.  Palliative care following and decision made to transition to full comfort measures.  Neurology was following.  3.  Abnormal 2D echo  2D echo with a EF of 30 to 35%, left ventricular global hypokinesis, moderate asymmetric LVH, cannot rule out mass in the left atrium versus artifactual changes, rhythm is a flutter, recommend TEE to evaluate for thrombus if clinically indicated, left atrial size severely dilated, left to right atrial shunt seen, evidence of atrial level shunting detected by color flow Doppler, mild to moderate right atrial size dilatation.  Patient with a depressed EF and wall motion abnormalities, compared to prior 2D echo.  Patient on full dose Lovenox which she will be discharged home on.  Palliative care has assessed patient and decision was made to transition to comfort measures with patient to go home with hospice, as such no further work-up indicated at this time.  Continue beta-blocker, resume diuretics in 1 to 2 days, risk factor modification.  4.  Acute metabolic encephalopathy likely secondary to subacute CVA Patient noted with some confusion however difficult to ascertain, as patient in the airborne room which is pretty loud.  Patient per RN the morning of 10/19/2019, was difficult to arouse and as such patient did not receive any of oral morning medications.  Patient drowsy however easily arousable and answering some questions.  Moving extremities spontaneously.    Concern that acute metabolic encephalopathy may be secondary to problem #1 and #2.  Patient noted to have a history of A. fib, LAA thrombus on Coumadin noted on admission due to subtherapeutic INR of 1.2.  UDS on admission noted to be positive for cocaine.  Head CT done negative.  MRI brain concerning for subacute CVA.  EEG done with no epileptiform discharges noted.  Urinalysis unremarkable.  Patient on full dose Lovenox due to subtherapeutic INR.   Palliative care consulted and decision made to be transition to full comfort measures.  5.  COVID-19 infection Patient wife noted to be diagnosed with COVID-19 approximately a week ago and admitted to [facility].  Chest x-ray done with cardiomegaly with prominent bibasilar interstitial markings which may reflect mild interstitial edema versus atypical viral infection.  LDH this morning was 295, ferritin at 464, procalcitonin 0.71, fibrin derivatives currently at 5279 from > 7,500 from > 10000.  Patient not hypoxic sats of 94-98% on room air.  Status post REGEN-COV x 1.  Supportive care.  If patient becomes hypoxic, with worsening fibrinogen derivatives despite full dose Lovenox will place on IV Solu-Medrol and IV remdesivir.  Follow inflammatory markers.  Family transitioning to full comfort measures.  Follow.  6.  Elevated fibrin derivatives Fibrin derivatives > 10,000.  Patient with history of A. fib, LAA thrombus on Coumadin however on presentation INR subtherapeutic.  Coumadin likely poorly controlled.  Lower extremity Dopplers negative for DVT.  Fibrin derivatives trending down.  Patient not hypoxic.  Patient being transitioned to full comfort measures.  Patient on full dose Lovenox which we will continue.  Follow.    7.  History of A.  fib with LAA thrombus INR subtherapeutic.  Patient on Coumadin.  Coumadin poorly managed.  Continue current full dose Lovenox.  Patient being transitioned to full comfort measures.  Coumadin has been discontinued and patient will be discharged home on full dose Lovenox.  Discussed with cardiology.  8.  Hypophosphatemia Phosphorus at 2.0.  K-Phos 30 mmol IV x1.  Repeat labs in the morning.   9.  Acute kidney injury on chronic kidney disease stage III Likely secondary to prerenal azotemia secondary to poor oral intake in the presence of diuretics.  Diuretics on hold.  Patient status post gentle hydration.  Creatinine down to 1.01.  IV fluids have been  saline lock.  Resume home dose oral Lasix.  Follow.   10.  History of cocaine use/polysubstance abuse UDS positive for cocaine.  Polysubstance cessation.  TOC consulted.  11.  Insulin-dependent diabetes mellitus Patient with poor oral intake.  Hemoglobin A1c 8.0.  CBG noted to be 127 this morning.  Continue half home dose Lantus 15 units daily.  Sliding scale insulin.  Patient being transitioned to comfort measures.  12.  Hypertension Patient did not receive antihypertensive medications on 10/19/2019, as per RN patient unarousable.  Patient arousable as he was assessed by speech therapy on 10/19/2019.  DC IV Lopressor and placed back on home regimen oral beta-blocker.  Continue Norvasc.  Follow.   13.  Failure to thrive Patient noted to have poor oral intake prior to admission.  Patient with no oral intake per RN.  Patient seen by palliative care will discuss with family and decision has been made to transition to comfort measures and likely home with hospice.  Appreciate palliative care input and recommendations.     DVT prophylaxis: Lovenox Code Status: DNR Family Communication: Updated patient.  Disposition:   Status is: Inpatient    Dispo: The patient is from: Home              Anticipated d/c is to: Home with hospice following.               Anticipated d/c date is: 10/22/2019.              Patient currently being treated for COVID-19 infection, neurology has assessed the patient for seizures.  Patient with subacute CVA noted on MRI brain.  On full dose Lovenox.  Patient to go home with hospice support, which will not be available till 10/22/2019.        Consultants:   Neurology: Dr.Aroor 10/17/2019  Palliative care: Nelda Severe, NP 10/18/2019  Procedures:  CT head 10/16/2019  Chest x-ray 10/16/2019  EEG 10/17/2019  MRI brain 10/17/2019  Carotid Dopplers 10/18/2019  Lower extremity Dopplers 10/17/2019  2D echo 10/20/2019  Antimicrobials:   REGEN-COV x1  10/17/2019   Subjective: Patient sleeping but arousable.  Denies any chest pain or shortness of breath.  No abdominal pain.  No further seizures noted.  No bleeding.  Follow.    Objective: Vitals:   10/20/19 2033 10/21/19 0018 10/21/19 0548 10/21/19 0752  BP: (!) 151/93 (!) 164/113 (!) 176/108 (!) 162/117  Pulse: 74 71 67 63  Resp: Temp: 98 F (36.7 C) 98.4 F (36.9 C) (!) 97.4 F (36.3 C) 98 F (36.7 C)  TempSrc: Oral Oral Oral   SpO2: 99% 100% 100% 98%  Weight:      Height:        Intake/Output Summary (Last 24 hours) at 10/21/2019 1136 Last data filed at 10/21/2019  0981 Gross per 24 hour  Intake 605.31 ml  Output --  Net 605.31 ml   Filed Weights   10/16/19 1413 10/16/19 1416  Weight: 67 kg 67 kg    Examination:  General exam: NAD. Respiratory system: Lungs clear to auscultation bilaterally.  No wheezes, no crackles, no rhonchi.  Normal respiratory effort.  Cardiovascular system: Irregularly irregular.  No murmurs rubs or gallops.  No JVD.  No lower extremity edema.   Gastrointestinal system: Abdomen is soft, nontender, nondistended, positive bowel sounds.  No rebound.  No guarding. Central nervous system: Sleeping, arousable.  Moving extremities spontaneously.  No focal deficits.  Extremities: Symmetric 5 x 5 power. Skin: No rashes, lesions or ulcers Psychiatry: Judgement and insight appear poor to fair. Mood & affect appropriate.     Data Reviewed: I have personally reviewed following labs and imaging studies  CBC: Recent Labs  Lab 10/17/19 1027 10/18/19 0522 10/19/19 0622 10/20/19 1017 10/21/19 0458  WBC 5.1 8.3 3.7* 3.0* 2.9*  NEUTROABS 4.4 7.1 2.8 2.0 1.8  HGB 15.5 16.1 15.4 16.4 14.6  HCT 47.5 52.3* 48.0 50.7 44.7  MCV 88.0 92.6 88.2 87.1 85.3  PLT 117* 113* 146* 170 192    Basic Metabolic Panel: Recent Labs  Lab 10/17/19 1027 10/18/19 0522 10/19/19 0622 10/20/19 1017 10/21/19 0458  NA 139 139 142 142 141  K 4.0 4.7 4.5 3.6  3.7  CL 102 103 105 105 104  CO2 26 20* 26 25 25   GLUCOSE 157* 161* 145* 83 175*  BUN 28* 24* 28* 22 23  CREATININE 1.44* 1.63* 1.49* 1.03 1.01  CALCIUM 8.8* 8.8* 8.9 8.9 8.7*  MG 2.1 2.0 2.1 1.9 1.7  PHOS 1.8* 3.7 2.5 2.3* 2.0*    GFR: Estimated Creatinine Clearance: 62.3 mL/min (by C-G formula based on SCr of 1.01 mg/dL).  Liver Function Tests: Recent Labs  Lab 10/17/19 1027 10/18/19 0522 10/19/19 0622 10/20/19 1017 10/21/19 0458  AST 38 38 35 33 28  ALT 17 14 15 14 14   ALKPHOS 55 52 53 59 57  BILITOT 1.2 1.4* 0.9 0.7 0.6  PROT 7.3 6.9 7.0 7.3 6.7  ALBUMIN 3.5 3.1* 3.1* 3.2* 2.8*    CBG: Recent Labs  Lab 10/20/19 1627 10/20/19 1656 10/20/19 1738 10/20/19 2119 10/21/19 0752  GLUCAP 62* 75 121* 250* 127*     Recent Results (from the past 240 hour(s))  SARS Coronavirus 2 by RT PCR (hospital order, performed in [facility] hospital lab) Nasopharyngeal Nasopharyngeal Swab     Status: Abnormal   Collection Time: 10/16/19  3:35 PM   Specimen: Nasopharyngeal Swab  Result Value Ref Range Status   SARS Coronavirus 2 POSITIVE (A) NEGATIVE Final    Comment: RESULT CALLED TO, READ BACK BY AND VERIFIED WITH: MITCH BARKER @1722  10/16/19 MJU (NOTE) SARS-CoV-2 target nucleic acids are DETECTED  SARS-CoV-2 RNA is generally detectable in upper respiratory specimens  during the acute phase of infection.  Positive results are indicative  of the presence of the identified virus, but do not rule out bacterial infection or co-infection with other pathogens not detected by the test.  Clinical correlation with patient history and  other diagnostic information is necessary to determine patient infection status.  The expected result is negative.  Fact Sheet for Patients:   BoilerBrush.com.cy   Fact Sheet for Healthcare Providers:   https://pope.com/    This test is not yet approved or cleared by the [place] FDA and  has been  authorized for  detection and/or diagnosis of SARS-CoV-2 by FDA under an Emergency Use Authorization (EUA).  This EUA will remain in effect (meaning this test ca n be used) for the duration of  the COVID-19 declaration under Section 564(b)(1) of the Act, 21 U.S.C. section 360-bbb-3(b)(1), unless the authorization is terminated or revoked sooner.  Performed at [facility], [address] [place], [place] [address]   Urine Culture     Status: Abnormal   Collection Time: 10/16/19  7:51 PM   Specimen: Urine, Random  Result Value Ref Range Status   Specimen Description   Final    URINE, RANDOM Performed at [facility], [address] [place], [place] [address]    Special Requests   Final    NONE Performed at [facility], [address] [place], [place] [address]    Culture MULTIPLE SPECIES PRESENT, SUGGEST RECOLLECTION (A)  Final   Report Status 10/18/2019 FINAL  Final         Radiology Studies: ECHOCARDIOGRAM COMPLETE  Result Date: 10/20/2019    ECHOCARDIOGRAM REPORT   Patient Name:   Sherryle LisFRANK LEWIS Lomax Date of Exam: 10/20/2019 Medical Rec #:  [id number]            Height:       66.0 in Accession #:    [id number]           Weight:       147.7 lb Date of Birth:  December 05, 1950           BSA:          1.758 m Patient Age:    68 years             BP:           172/125 mmHg Patient Gender: M                    HR:           61 bpm. Exam Location:  [facility] Procedure: 2D Echo and Intracardiac Opacification Agent Indications:     Stroke  History:         Patient has no prior history of Echocardiogram examinations. LA                  Thrombus, Stroke, Arrythmias:Atrial Fibrillation; Risk                  Factors:Hypertension, Diabetes and Cocaine Abuse.  Sonographer:     L Thornton-Maynard Referring Phys:  [id number] Lovey NewcomerANIEL V Goldman Birchall Diagnosing Phys: Debbe OdeaBrian Agbor-Etang MD IMPRESSIONS  1. Left ventricular ejection fraction, by estimation, is 30 to 35%. The left ventricle has  moderate to severely decreased function. The left ventricle demonstrates global hypokinesis. There is moderate asymmetric left ventricular hypertrophy. Left ventricular diastolic parameters are consistent with Grade II diastolic dysfunction (pseudonormalization).  2. Right ventricular systolic function is normal. The right ventricular size is normal. Mildly increased right ventricular wall thickness. There is normal pulmonary artery systolic pressure.  3. Cannut rule out mass in the left atrium vs artifactual changes on current study. patient's rhythm is atrial flutter. recommends TEE to evaluate for thrombus if clinically indicated.. Left atrial size was severely dilated.  4. 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 . Evidence of atrial level shunting detected by color flow Doppler.  5. Right atrial size was mild to moderately dilated.  6. The mitral valve is normal in structure. Trivial mitral valve regurgitation.  7. The aortic  valve is tricuspid. Aortic valve regurgitation is not visualized.  8. Aortic dilatation noted. There is borderline dilatation of the aortic root measuring 38 mm.  9. The inferior vena cava is normal in size with greater than 50% respiratory variability, suggesting right atrial pressure of 3 mmHg. FINDINGS  Left Ventricle: Left ventricular ejection fraction, by estimation, is 30 to 35%. The left ventricle has moderate to severely decreased function. The left ventricle demonstrates global hypokinesis. Definity contrast agent was given IV to delineate the left ventricular endocardial borders. The left ventricular internal cavity size was normal in size. There is moderate asymmetric left ventricular hypertrophy. Left ventricular diastolic parameters are consistent with Grade II diastolic dysfunction (pseudonormalization). Right Ventricle: The right ventricular size is normal. Mildly increased right ventricular wall thickness. Right ventricular systolic function is normal. There is normal  pulmonary artery systolic pressure. The tricuspid regurgitant velocity is 2.56 m/s, and with an assumed right atrial pressure of 3 mmHg, the estimated right ventricular systolic pressure is 29.2 mmHg. Left Atrium: Cannut rule out mass in the left atrium vs artifactual changes on current study. patient's rhythm is atrial flutter. recommends TEE to evaluate for thrombus if clinically indicated. Left atrial size was severely dilated. Right Atrium: Right atrial size was mild to moderately dilated. Pericardium: Trivial pericardial effusion is present. Mitral Valve: The mitral valve is normal in structure. Trivial mitral valve regurgitation. MV peak gradient, 1.2 mmHg. The mean mitral valve gradient is 0.0 mmHg. Tricuspid Valve: The tricuspid valve is grossly normal. Tricuspid valve regurgitation is not demonstrated. Aortic Valve: The aortic valve is tricuspid. Aortic valve regurgitation is not visualized. Aortic valve mean gradient measures 1.0 mmHg. Aortic valve peak gradient measures 2.0 mmHg. Aortic valve area, by VTI measures 2.52 cm. Pulmonic Valve: The pulmonic valve was not well visualized. Pulmonic valve regurgitation is not visualized. Aorta: Aortic dilatation noted. There is borderline dilatation of the aortic root measuring 38 mm. Venous: The inferior vena cava is normal in size with greater than 50% respiratory variability, suggesting right atrial pressure of 3 mmHg. IAS/Shunts: There is right bowing of the interatrial septum, suggestive of elevated left atrial pressure. Evidence of atrial level shunting detected by color flow Doppler.  LEFT VENTRICLE PLAX 2D LVIDd:         4.22 cm     Diastology LVIDs:         3.76 cm     LV e' lateral:   2.34 cm/s LV PW:         1.81 cm     LV E/e' lateral: 23.2 LV IVS:        2.00 cm     LV e' medial:    3.33 cm/s LVOT diam:     2.10 cm     LV E/e' medial:  16.3 LV SV:         29 LV SV Index:   16 LVOT Area:     3.46 cm  LV Volumes (MOD) LV vol d, MOD A2C: 62.7 ml LV vol  d, MOD A4C: 79.9 ml LV vol s, MOD A2C: 46.0 ml LV vol s, MOD A4C: 36.2 ml LV SV MOD A2C:     16.8 ml LV SV MOD A4C:     79.9 ml LV SV MOD BP:      28.5 ml RIGHT VENTRICLE RV S prime:     5.70 cm/s TAPSE (M-mode): 1.8 cm LEFT ATRIUM              Index  LA diam:        5.30 cm  3.01 cm/m LA Vol (A2C):   162.0 ml 92.14 ml/m LA Vol (A4C):   211.0 ml 120.02 ml/m LA Biplane Vol: 195.0 ml 110.91 ml/m  AORTIC VALVE                   PULMONIC VALVE AV Area (Vmax):    2.43 cm    PV Vmax:       0.94 m/s AV Area (Vmean):   2.50 cm    PV Peak grad:  3.5 mmHg AV Area (VTI):     2.52 cm AV Vmax:           70.70 cm/s AV Vmean:          46.600 cm/s AV VTI:            0.113 m AV Peak Grad:      2.0 mmHg AV Mean Grad:      1.0 mmHg LVOT Vmax:         49.50 cm/s LVOT Vmean:        33.600 cm/s LVOT VTI:          0.082 m LVOT/AV VTI ratio: 0.73  AORTA Ao Root diam: 3.70 cm MITRAL VALVE               TRICUSPID VALVE MV Area (PHT): 2.99 cm    TR Peak grad:   26.2 mmHg MV Peak grad:  1.2 mmHg    TR Vmax:        256.00 cm/s MV Mean grad:  0.0 mmHg MV Vmax:       0.55 m/s    SHUNTS MV Vmean:      31.1 cm/s   Systemic VTI:  0.08 m MV Decel Time: 254 msec    Systemic Diam: 2.10 cm MV E velocity: 54.30 cm/s Debbe Odea MD Electronically signed by Debbe Odea MD Signature Date/Time: 10/20/2019/12:46:55 PM    Final         Scheduled Meds: . amLODipine  10 mg Oral Daily  . vitamin C  500 mg Oral Daily  . atorvastatin  40 mg Oral Daily  . citalopram  10 mg Oral Daily  . docusate sodium  100 mg Oral BID  . enoxaparin (LOVENOX) injection  1 mg/kg Subcutaneous Q12H  . folic acid  1 mg Oral Daily  . furosemide  20 mg Oral Daily  . insulin aspart  0-9 Units Subcutaneous TID WC  . insulin glargine  15 Units Subcutaneous Daily  . loratadine  10 mg Oral Daily  . metoprolol tartrate  25 mg Oral BID  . multivitamin with minerals  1 tablet Oral Daily  . senna  1 tablet Oral BID  . thiamine  100 mg Oral Daily  . zinc  sulfate  220 mg Oral Daily   Continuous Infusions: . sodium chloride    . famotidine (PEPCID) IV    . levETIRAcetam Stopped (10/21/19 0137)  . magnesium sulfate bolus IVPB 4 g (10/21/19 1038)  . potassium PHOSPHATE IVPB (in mmol) 30 mmol (10/21/19 1034)     LOS: 4 days    Time spent: 35 minutes    Ramiro Harvest, MD [facility]   To contact the attending provider between 7A-7P or the covering provider during after hours 7P-7A, please log into the web site [email or web address] and access using universal [place] password for that web site. If you do not have the password, please call the  hospital operator.  10/21/2019, 11:36 AM

## 2019-10-22 LAB — CBC WITH DIFFERENTIAL/PLATELET
Abs Immature Granulocytes: 0.04 10*3/uL (ref 0.00–0.07)
Basophils Absolute: 0 10*3/uL (ref 0.0–0.1)
Basophils Relative: 0 %
Eosinophils Absolute: 0.1 10*3/uL (ref 0.0–0.5)
Eosinophils Relative: 2 %
HCT: 43.8 % (ref 39.0–52.0)
Hemoglobin: 14.2 g/dL (ref 13.0–17.0)
Immature Granulocytes: 2 %
Lymphocytes Relative: 28 %
Lymphs Abs: 0.6 10*3/uL — ABNORMAL LOW (ref 0.7–4.0)
MCH: 28.1 pg (ref 26.0–34.0)
MCHC: 32.4 g/dL (ref 30.0–36.0)
MCV: 86.7 fL (ref 80.0–100.0)
Monocytes Absolute: 0.3 10*3/uL (ref 0.1–1.0)
Monocytes Relative: 14 %
Neutro Abs: 1.2 10*3/uL — ABNORMAL LOW (ref 1.7–7.7)
Neutrophils Relative %: 54 %
Platelets: 197 10*3/uL (ref 150–400)
RBC: 5.05 MIL/uL (ref 4.22–5.81)
RDW: 13.7 % (ref 11.5–15.5)
Smear Review: NORMAL
WBC: 2.3 10*3/uL — ABNORMAL LOW (ref 4.0–10.5)
nRBC: 0 % (ref 0.0–0.2)

## 2019-10-22 LAB — COMPREHENSIVE METABOLIC PANEL
ALT: 12 U/L (ref 0–44)
AST: 25 U/L (ref 15–41)
Albumin: 2.8 g/dL — ABNORMAL LOW (ref 3.5–5.0)
Alkaline Phosphatase: 52 U/L (ref 38–126)
Anion gap: 9 (ref 5–15)
BUN: 22 mg/dL (ref 8–23)
CO2: 27 mmol/L (ref 22–32)
Calcium: 8.4 mg/dL — ABNORMAL LOW (ref 8.9–10.3)
Chloride: 101 mmol/L (ref 98–111)
Creatinine, Ser: 1.06 mg/dL (ref 0.61–1.24)
GFR calc Af Amer: >60 mL/min (ref >60)
GFR calc non Af Amer: >60 mL/min (ref >60)
Glucose, Bld: 106 mg/dL — ABNORMAL HIGH (ref 70–99)
Potassium: 3.7 mmol/L (ref 3.5–5.1)
Sodium: 137 mmol/L (ref 135–145)
Total Bilirubin: 0.6 mg/dL (ref 0.3–1.2)
Total Protein: 6.6 g/dL (ref 6.5–8.1)

## 2019-10-22 LAB — C-REACTIVE PROTEIN: CRP: 5.6 mg/dL — ABNORMAL HIGH (ref <1.0)

## 2019-10-22 LAB — PROCALCITONIN: Procalcitonin: 0.29 ng/mL

## 2019-10-22 LAB — MAGNESIUM: Magnesium: 2.3 mg/dL (ref 1.7–2.4)

## 2019-10-22 LAB — FIBRIN DERIVATIVES D-DIMER (ARMC ONLY): Fibrin derivatives D-dimer (ARMC): 2869.11 ng/mL (FEU) — ABNORMAL HIGH (ref 0.00–499.00)

## 2019-10-22 LAB — FERRITIN: Ferritin: 401 ng/mL — ABNORMAL HIGH (ref 24–336)

## 2019-10-22 LAB — GLUCOSE, CAPILLARY
Glucose-Capillary: 108 mg/dL — ABNORMAL HIGH (ref 70–99)
Glucose-Capillary: 100 mg/dL — ABNORMAL HIGH (ref 70–99)
Glucose-Capillary: 83 mg/dL (ref 70–99)

## 2019-10-22 MED ORDER — FOLIC ACID 1 MG PO TABS: 1.0000 mg | ORAL_TABLET | Freq: Every day | ORAL | Status: DC

## 2019-10-22 MED ORDER — METOPROLOL TARTRATE 25 MG PO TABS: 25.0000 mg | ORAL_TABLET | Freq: Two times a day (BID) | ORAL | 1 refills | Status: AC

## 2019-10-22 MED ORDER — ADULT MULTIVITAMIN W/MINERALS CH: 1.0000 | ORAL_TABLET | Freq: Every day | ORAL | Status: DC

## 2019-10-22 MED ORDER — ATORVASTATIN CALCIUM 40 MG PO TABS
40.0000 mg | ORAL_TABLET | Freq: Every day | ORAL | 1 refills | Status: DC
Start: 2019-10-22 — End: 2020-01-23

## 2019-10-22 MED ORDER — ALBUTEROL SULFATE HFA 108 (90 BASE) MCG/ACT IN AERS: 2.0000 | INHALATION_SPRAY | RESPIRATORY_TRACT | 0 refills | Status: DC | PRN

## 2019-10-22 MED ORDER — ASCORBIC ACID 500 MG PO TABS: 500.0000 mg | ORAL_TABLET | Freq: Every day | ORAL | Status: DC

## 2019-10-22 MED ORDER — SENNA 8.6 MG PO TABS: 1.0000 | ORAL_TABLET | Freq: Two times a day (BID) | ORAL | 0 refills | Status: DC

## 2019-10-22 MED ORDER — INSULIN GLARGINE 100 UNIT/ML SOLOSTAR PEN: 7.0000 [IU] | PEN_INJECTOR | Freq: Every day | SUBCUTANEOUS | 0 refills | Status: DC

## 2019-10-22 MED ORDER — FAMOTIDINE 20 MG PO TABS: 20.0000 mg | ORAL_TABLET | Freq: Every day | ORAL | 1 refills | Status: DC

## 2019-10-22 MED ORDER — MORPHINE SULFATE (CONCENTRATE) 10 MG/0.5ML PO SOLN
5.0000 mg | ORAL | 0 refills | Status: DC | PRN
Start: 2019-10-22 — End: 2020-01-23

## 2019-10-22 MED ORDER — LEVETIRACETAM 1000 MG PO TABS: 1000.0000 mg | ORAL_TABLET | Freq: Two times a day (BID) | ORAL | 1 refills | Status: DC

## 2019-10-22 MED ORDER — ENOXAPARIN SODIUM 80 MG/0.8ML ~~LOC~~ SOLN: 1.0000 mg/kg | Freq: Two times a day (BID) | SUBCUTANEOUS | 1 refills | Status: DC

## 2019-10-22 MED ORDER — DEXAMETHASONE 6 MG PO TABS: 6.0000 mg | ORAL_TABLET | Freq: Every day | ORAL | 0 refills | Status: AC

## 2019-10-22 MED ORDER — FUROSEMIDE 20 MG PO TABS: 20.0000 mg | ORAL_TABLET | Freq: Every day | ORAL | 1 refills | Status: AC

## 2019-10-22 MED ORDER — THIAMINE HCL 100 MG PO TABS: 100.0000 mg | ORAL_TABLET | Freq: Every day | ORAL | Status: DC

## 2019-10-22 MED ORDER — AMLODIPINE BESYLATE 10 MG PO TABS
10.0000 mg | ORAL_TABLET | Freq: Every day | ORAL | 1 refills | Status: DC
Start: 2019-10-22 — End: 2020-01-23

## 2019-10-22 MED ORDER — CITALOPRAM HYDROBROMIDE 10 MG PO TABS
10.0000 mg | ORAL_TABLET | Freq: Every day | ORAL | 1 refills | Status: DC
Start: 2019-10-22 — End: 2020-01-23

## 2019-10-22 MED ORDER — ZINC SULFATE 220 (50 ZN) MG PO CAPS: 220.0000 mg | ORAL_CAPSULE | Freq: Every day | ORAL | Status: DC

## 2019-10-22 NOTE — Progress Notes (Addendum)
[facility] Room 108 [facility]) Hospital Liaison RN note:  Received request from Dr. Janee Morn and Harvest Dark, NP for hospice services at daughter Tressa's home after discharge. Chart and patient information under review by [facility] physician. Hospice eligibility was approved .  Spoke with daughter, Refugio Vandevoorde, to initiate education related to hospice philosophy, services and to answer any questions. Olivia Mackie verbalized understanding of information given. Per discussion, the plan is for discharge when ready to her home by EMS. Hospital care team is aware.  DME needs discussed. Olivia Mackie requests a hospital bed with half rails and OBT. Tressa's address and phone number has been provided to referral for equipment delivery.  Please send signed and completed DNR home with patient. Please provide prescriptions at discharge as needed to ensure ongoing symptom management.  Thank you for the opportunity to participate in this patient's care.  Please call with any hospice related questions or concerns.  Cyndra Numbers, RN [facility] Liaison [phone]

## 2019-10-22 NOTE — TOC Transition Note (Signed)
Transition of Care [facility]) - Initial/Assessment Note    Patient Details  Name: Lee Gonzalez MRN: [id number] Date of Birth: [birth year]  Transition of Care [facility]) CM/SW Contact:    Marina Goodell Phone Number: [phone] 10/22/2019, 3:51 PM  Clinical Narrative:                  Pt d/c to daughter's house [facility] [place]. [address] [place], [place], [place], [place] [address].  EMS called for transport.  D/C packet given to Unit Secretary.  Expected Discharge Plan: Home w Hospice Care Barriers to Discharge: Other (comment) (Hospice is unable to admit this weekend)   Patient Goals and CMS Choice Patient states their goals for this hospitalization and ongoing recovery are:: Family is interested in the patient going for short term rehab at [facility] CMS Medicare.gov Compare Post Acute Care list provided to:: Patient Represenative (must comment) Choice offered to / list presented to : Adult Children (daughter)  Expected Discharge Plan and Services Expected Discharge Plan: Home w Hospice Care   Discharge Planning Services: CM Consult Post Acute Care Choice: Hospice Living arrangements for the past 2 months: Single Family Home Expected Discharge Date: 10/22/19                                    Prior Living Arrangements/Services Living arrangements for the past 2 months: Single Family Home Lives with:: Spouse Patient language and need for interpreter reviewed:: Yes Do you feel safe going back to the place where you live?: Yes      Need for Family Participation in Patient Care: Yes (Comment) (COVID) Care giver support system in place?: Yes (comment) (wife and daughter's and granddaughter)   Criminal Activity/Legal Involvement Pertinent to Current Situation/Hospitalization: No - Comment as needed  Activities of Daily Living Home Assistive Devices/Equipment: Cane (specify quad or straight) ADL Screening (condition at time of admission) Patient's cognitive ability  adequate to safely complete daily activities?: No Is the patient deaf or have difficulty hearing?: No Does the patient have difficulty seeing, even when wearing glasses/contacts?: No Does the patient have difficulty concentrating, remembering, or making decisions?: Yes Patient able to express need for assistance with ADLs?: No Does the patient have difficulty dressing or bathing?: Yes Independently performs ADLs?: No Communication: Needs assistance Is this a change from baseline?: Pre-admission baseline Dressing (OT): Needs assistance Is this a change from baseline?: Pre-admission baseline Grooming: Needs assistance Is this a change from baseline?: Pre-admission baseline Feeding: Needs assistance Is this a change from baseline?: Pre-admission baseline Bathing: Dependent Is this a change from baseline?: Pre-admission baseline Toileting: Dependent Is this a change from baseline?: Pre-admission baseline In/Out Bed: Dependent Is this a change from baseline?: Pre-admission baseline Walks in Home: Dependent Is this a change from baseline?: Pre-admission baseline Does the patient have difficulty walking or climbing stairs?: Yes Weakness of Legs: Both Weakness of Arms/Hands: Both  Permission Sought/Granted Permission sought to share information with : Case Manager, Family Supports, Oceanographer granted to share information with : Yes, Verbal Permission Granted  Share Information with NAME: Windell Moulding and Suzette Battiest  Permission granted to share info w AGENCY: SNF's  Permission granted to share info w Relationship: wife and granddaughter     Emotional Assessment       Orientation: : Oriented to Self Alcohol / Substance Use: Illicit Drugs Psych Involvement: No (comment)  Admission diagnosis:  Syncope and  collapse [R55] Dehydration [E86.0] Seizure (HCC) [R56.9] AKI (acute kidney injury) (HCC) [N17.9] Patient Active Problem List   Diagnosis Date Noted    Abnormal echocardiogram    CVA (cerebral vascular accident) (HCC): Subacute 10/18/2019   Goals of care, counseling/discussion    Palliative care by specialist    DNR (do not resuscitate)    AKI (acute kidney injury) (HCC)    Dehydration    Acute metabolic encephalopathy    Fibrinolysis (HCC)    Insulin-requiring or dependent type II diabetes mellitus (HCC)    Failure to thrive in adult    Hypophosphatemia    AF (paroxysmal atrial fibrillation) (HCC)    COVID-19 virus infection    Seizure (HCC) 10/16/2019   Pain due to onychomycosis of toenails of both feet 11/08/2018   Diabetes mellitus without complication (HCC) 11/08/2018   Coagulation disorder (HCC) 11/08/2018   Permanent atrial fibrillation (HCC) 07/31/2018   Thrombus of left atrial appendage 07/31/2018   Cocaine abuse (HCC) 12/19/2017   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   Elevated troponin 12/19/2017   Head pain, chronic 06/08/2016   Altered mental status 06/14/2015   Sepsis (HCC) 06/14/2015   Convulsions (HCC) 05/02/2014   Head and face pain 10/28/2013   History of CVA (cerebrovascular accident) 10/28/2013   Syncope 10/28/2013   PCP:  [facility] Pharmacy:   [facility] - [place], [place] - [address] [place] [address] Phone: [phone] Fax: [phone]  [facility] [address], [place] - [address] [place] [address] Phone: [phone] Fax: [phone]     Social Determinants of Health (SDOH) Interventions    Readmission Risk Interventions No flowsheet data found.

## 2019-10-22 NOTE — Discharge Summary (Signed)
Physician Discharge Summary  Lee Gonzalez ZOX:[id number] DOB: 1950/02/26 DOA: 10/16/2019  PCP: [facility]  Admit date: 10/16/2019 Discharge date: 10/22/2019  Time spent: 60 minutes  Recommendations for Outpatient Follow-up:  1. Follow-up with [facility] in 2 weeks.  On follow-up patient need a basic metabolic profile done to follow-up on electrolytes and renal function.  Patient diabetes will need to be reassessed.  Patient is being discharged home with hospice. 2. Follow-up with Dr. Mariah Milling, cardiology in 2 to 3 weeks for follow-up on abnormal 2D echo and left atrial thrombus.  Patient's Coumadin was discontinued and patient discharged home on full dose Lovenox as patient had presented with subtherapeutic INR and likely had failed Coumadin. 3. Follow-up with Dr. Theora Master, neurology in 4 weeks. 4. Patient be discharged home with hospice following.   Discharge Diagnoses:  Active Problems:   Thrombus of left atrial appendage   Seizure (HCC)   AKI (acute kidney injury) (HCC)   Dehydration   Acute metabolic encephalopathy   Fibrinolysis (HCC)   Insulin-requiring or dependent type II diabetes mellitus (HCC)   Failure to thrive in adult   Hypophosphatemia   AF (paroxysmal atrial fibrillation) (HCC)   COVID-19 virus infection   CVA (cerebral vascular accident) [facility]): Subacute   Goals of care, counseling/discussion   Palliative care by specialist   DNR (do not resuscitate)   Abnormal echocardiogram   Discharge Condition: Stable  Diet recommendation: Dysphagia 2 diet  Filed Weights   10/16/19 1413 10/16/19 1416  Weight: 67 kg 67 kg    History of present illness:  HPI per Dr. Cleon Gustin is a 69 y.o. male with medical history significant of seizure disorder, diabetes mellitus, hypertension, , afib on coumadin, LA thrombus, stroke and cocaine abuse was brought in by daughter who reported about 5 days ago she received a  call from her mother that stated patient had passed out.  The daughter told the mother to call EMS but they never did.  Per daughter patient had not been eating or drinking much and in bed.  Sleeping a lot.  While in the ER patient started seizing in the triage room and per nursing it lasted about 1 minute and he was postictal.  Patient then was responding to verbal stimuli at that time.  Unfortunately patient was not very interactive with physical exam for me refused to answer my questions.  Please note information was taken from ER note and also I called the daughter about patient's status.   ED Course:  In the ER he was given phenytoin.  He was also CT of the head that was negative for acute intracranial abnormality.  He was found with Covid positive test.  Apparently patient lives with his wife who was diagnosed and admitted to [facility] with coronavirus about a week ago.  He does have history of cocaine use and his urine toxicology is pending.  His creatinine is 1.62 from baseline of 1.23.  Glucose 184.  BNP 283.7, CK total 374.  Pt will be admitted for witnessed seizure.  Hospital Course:  1 seizure disorder/breakthrough seizure Patient presented with decreased oral intake, syncopal episode at home several days prior to admission, increased sleepiness and noted to have a witnessed seizure in the ED lasting about a minute and noted to be postictal at that time.  Head CT done negative for any acute intracranial abnormalities.  COVID-19 PCR positive.  Patient with history of seizure disorder and  per admitting physician daughter stated usually develops seizures when he is sick.  Patient was placed on seizure precautions, aspiration precautions.  Patient loaded with phenytoin and phenytoin ordered per pharmacy.  UDS positive for cocaine.  Consulted neurology  and per their note patient not on AED epileptic drugs at home.  Neurology recommended routine EEG which was done which was negative for  any epileptiform discharges.  MRI brain concerning for subacute CVA.  Patient with no further seizures.  Patient has been changed from Dilantin to Keppra per neurology.    Patient maintained on seizure precautions.  Patient be discharged home on oral Keppra 1000 mg twice daily.  Outpatient follow-up with neurology in 4 weeks.  Palliative care assessed patient and discussed with family and decision was made to transition to full comfort measures.  Patient will be discharged home with hospice.   2.  Subacute CVA Noted on MRI brain.  Stroke work-up ordered and underway.  Carotid Dopplers done with no significant ICA stenosis.  2D echo with a EF of 30 to 35%, left ventricular global hypokinesis, moderate asymmetric LVH, cannot rule out mass in the left atrium versus artifactual changes, rhythm is a flutter, recommend TEE to evaluate for thrombus if clinically indicated, left atrial size severely dilated, left to right atrial shunt seen, evidence of atrial level shunting detected by color flow Doppler, mild to moderate right atrial size dilatation.  Patient placed on full dose Lovenox as INR was subtherapeutic prior to admission.  PT/OT/ST.  Palliative care following and decision made to transition to full comfort measures.    Patient was seen and followed by neurology during the hospitalization.  Outpatient follow-up with neurologist.   3.  Abnormal 2D echo  2D echo with a EF of 30 to 35%, left ventricular global hypokinesis, moderate asymmetric LVH, cannot rule out mass in the left atrium versus artifactual changes, rhythm is a flutter, recommend TEE to evaluate for thrombus if clinically indicated, left atrial size severely dilated, left to right atrial shunt seen, evidence of atrial level shunting detected by color flow Doppler, mild to moderate right atrial size dilatation.  Patient with a depressed EF and wall motion abnormalities, compared to prior 2D echo.  Patient on full dose Lovenox throughout the  hospitalization which patient be discharged home on. Palliative care has assessed patient and decision was made to transition to comfort measures with patient to go home with hospice, as such no further work-up indicated at this time.    Patient maintained on home regimen beta-blocker, diuretics resumed.  Outpatient follow-up with cardiology.    4.  Acute metabolic encephalopathy likely secondary to subacute CVA Patient noted with some confusion however difficult to ascertain, as patient in the airborne room which is pretty loud.  Patient per RN the morning of 10/19/2019, was difficult to arouse and as such patient did not receive any of oral morning medications.  Patient drowsy however easily arousable and answering some questions.  Moving extremities spontaneously.    Concern that acute metabolic encephalopathy may be secondary to problem #1 and #2.  Patient noted to have a history of A. fib, LAA thrombus on Coumadin noted on admission due to subtherapeutic INR of 1.2.  UDS on admission noted to be positive for cocaine.  Head CT done negative.  MRI brain concerning for subacute CVA.  EEG done with no epileptiform discharges noted.  Urinalysis unremarkable.  Patient on full dose Lovenox due to subtherapeutic INR.  Palliative care consulted and decision made  to be transition to full comfort measures.  5.  COVID-19 infection Patient wife noted to be diagnosed with COVID-19 approximately a week ago and admitted to [facility].  Chest x-ray done with cardiomegaly with prominent bibasilar interstitial markings which may reflect mild interstitial edema versus atypical viral infection.  LDH this morning was 295, ferritin at 464, procalcitonin 0.71, fibrin derivatives currently at 5279 from > 7,500 from > 10000.  Patient not hypoxic sats of 94-98% on room air.  Status post REGEN-COV x 1 on admission.  Patient was placed on full dose Lovenox and received IV Solu-Medrol during the hospitalization.   CRP  levels trending down.  Patient will be discharged home on Decadron 6 mg daily x5 more days.  Patient was not hypoxic by day of discharge.  Palliative care consulted and decision was made to transition to full comfort measures.  Patient will be discharged home with hospice.   6.  Elevated fibrin derivatives Fibrin derivatives > 10,000.  Patient with history of A. fib, LAA thrombus on Coumadin however on presentation INR subtherapeutic.  Coumadin likely poorly controlled.  Lower extremity Dopplers negative for DVT.  Fibrin derivatives trended down.  Patient was not hypoxic.  Patient maintained on full dose Lovenox throughout the hospitalization and patient be discharged home on full dose Lovenox.  Palliative care consulted and decision was made to transition to full comfort measures.  Outpatient follow-up.   7.  History of A. fib with LAA thrombus INR subtherapeutic.  Patient on Coumadin.  Coumadin poorly managed.    Patient subsequently placed on full dose Lovenox.  transitioned to full comfort measures.  Coumadin has been discontinued and patient will be discharged home on full dose Lovenox.  Discussed with cardiology.  Patient will be discharged home with hospice following.  Outpatient follow-up with cardiology.  8.  Hypophosphatemia Repleted.    9.  Acute kidney injury on chronic kidney disease stage III Likely secondary to prerenal azotemia secondary to poor oral intake in the presence of diuretics.  Diuretics initially held and patient gently hydrated.  Renal function improved.  Patient's diuretics were resumed.  Renal function by day of discharge was a creatinine of 1.06.  Outpatient follow-up.   10.  History of cocaine use/polysubstance abuse UDS positive for cocaine.  Polysubstance cessation.  TOC consulted.  11.  Insulin-dependent diabetes mellitus Patient with poor oral intake.  Hemoglobin A1c 8.0.    Patient recently placed on half home dose Lantus however blood glucose levels were  low due to poor oral intake and as such Lantus discontinued.  Patient be discharged home on Lantus 7 units daily.  Patient will be discharged home with hospice.    12.  Hypertension Patient did not receive antihypertensive medications on 10/19/2019, as per RN patient unarousable.  Patient arousable as he was assessed by speech therapy on 10/19/2019.    Patient initially placed on IV Lopressor due to concerns of poor oral intake and subsequently placed back on home regimen of oral beta-blocker and Norvasc.  Blood pressure remained controlled.  Patient be discharged home with hospice.  Outpatient follow-up with PCP.   13.  Failure to thrive Patient noted to have poor oral intake prior to admission.  Patient with minimal oral intake per RN during the hospitalization.  Patient seen by palliative care whom discussed with family and decision was made to transition to comfort measures and likely home with hospice.  Patient will be discharged home with hospice.  Procedures:  CT head 10/16/2019  Chest x-ray 10/16/2019  EEG 10/17/2019  MRI brain 10/17/2019  Carotid Dopplers 10/18/2019  Lower extremity Dopplers 10/17/2019  2D echo 10/20/2019  Consultations:  Neurology: Dr.Aroor 10/17/2019  Palliative care: Nelda Severe, NP 10/18/2019   Discharge Exam: Vitals:   10/22/19 0752 10/22/19 1133  BP: (!) 170/85 (!) 150/108  Pulse: (!) 57 (!) 59  Resp: 17 17  Temp: (!) 97.5 F (36.4 C) 98 F (36.7 C)  SpO2: 100% 100%    General: NAD Cardiovascular: RRR Respiratory: CTAB  Discharge Instructions   Discharge Instructions    Diet - low sodium heart healthy   Complete by: As directed    Dysphagia 2 diet   Discharge instructions   Complete by: As directed    ?   Person Under Monitoring Name: [facility]  Location: [address] [place] [address]   Infection Prevention Recommendations for Individuals Confirmed to have, or Being Evaluated for, 2019 Novel Coronavirus  (COVID-19) Infection Who Receive Care at Home  Individuals who are confirmed to have, or are being evaluated for, COVID-19 should follow the prevention steps below until a healthcare provider or local or state health department says they can return to normal activities.  Stay home except to get medical care You should restrict activities outside your home, except for getting medical care. Do not go to work, school, or public areas, and do not use public transportation or taxis.  Call ahead before visiting your doctor Before your medical appointment, call the healthcare provider and tell them that you have, or are being evaluated for, COVID-19 infection. This will help the healthcare provider's office take steps to keep other people from getting infected. Ask your healthcare provider to call the local or state health department.  Monitor your symptoms Seek prompt medical attention if your illness is worsening (e.g., difficulty breathing). Before going to your medical appointment, call the healthcare provider and tell them that you have, or are being evaluated for, COVID-19 infection. Ask your healthcare provider to call the local or state health department.  Wear a facemask You should wear a facemask that covers your nose and mouth when you are in the same room with other people and when you visit a healthcare provider. People who live with or visit you should also wear a facemask while they are in the same room with you.  Separate yourself from other people in your home As much as possible, you should stay in a different room from other people in your home. Also, you should use a separate bathroom, if available.  Avoid sharing household items You should not share dishes, drinking glasses, cups, eating utensils, towels, bedding, or other items with other people in your home. After using these items, you should wash them thoroughly with soap and water.  Cover your coughs and  sneezes Cover your mouth and nose with a tissue when you cough or sneeze, or you can cough or sneeze into your sleeve. Throw used tissues in a lined trash can, and immediately wash your hands with soap and water for at least 20 seconds or use an alcohol-based hand rub.  Wash your [employer] your hands often and thoroughly with soap and water for at least 20 seconds. You can use an alcohol-based hand sanitizer if soap and water are not available and if your hands are not visibly dirty. Avoid touching your eyes, nose, and mouth with unwashed hands.   Prevention Steps for Caregivers and Household  Members of Individuals Confirmed to have, or Being Evaluated for, COVID-19 Infection Being Cared for in the Home  If you live with, or provide care at home for, a person confirmed to have, or being evaluated for, COVID-19 infection please follow these guidelines to prevent infection:  Follow healthcare provider's instructions Make sure that you understand and can help the patient follow any healthcare provider instructions for all care.  Provide for the patient's basic needs You should help the patient with basic needs in the home and provide support for getting groceries, prescriptions, and other personal needs.  Monitor the patient's symptoms If they are getting sicker, call his or her medical provider and tell them that the patient has, or is being evaluated for, COVID-19 infection. This will help the healthcare provider's office take steps to keep other people from getting infected. Ask the healthcare provider to call the local or state health department.  Limit the number of people who have contact with the patient If possible, have only one caregiver for the patient. Other household members should stay in another home or place of residence. If this is not possible, they should stay in another room, or be separated from the patient as much as possible. Use a separate bathroom, if  available. Restrict visitors who do not have an essential need to be in the home.  Keep older adults, very young children, and other sick people away from the patient Keep older adults, very young children, and those who have compromised immune systems or chronic health conditions away from the patient. This includes people with chronic heart, lung, or kidney conditions, diabetes, and cancer.  Ensure good ventilation Make sure that shared spaces in the home have good air flow, such as from an air conditioner or an opened window, weather permitting.  Wash your hands often Wash your hands often and thoroughly with soap and water for at least 20 seconds. You can use an alcohol based hand sanitizer if soap and water are not available and if your hands are not visibly dirty. Avoid touching your eyes, nose, and mouth with unwashed hands. Use disposable paper towels to dry your hands. If not available, use dedicated cloth towels and replace them when they become wet.  Wear a facemask and gloves Wear a disposable facemask at all times in the room and gloves when you touch or have contact with the patient's blood, body fluids, and/or secretions or excretions, such as sweat, saliva, sputum, nasal mucus, vomit, urine, or feces.  Ensure the mask fits over your nose and mouth tightly, and do not touch it during use. Throw out disposable facemasks and gloves after using them. Do not reuse. Wash your hands immediately after removing your facemask and gloves. If your personal clothing becomes contaminated, carefully remove clothing and launder. Wash your hands after handling contaminated clothing. Place all used disposable facemasks, gloves, and other waste in a lined container before disposing them with other household waste. Remove gloves and wash your hands immediately after handling these items.  Do not share dishes, glasses, or other household items with the patient Avoid sharing household items. You  should not share dishes, drinking glasses, cups, eating utensils, towels, bedding, or other items with a patient who is confirmed to have, or being evaluated for, COVID-19 infection. After the person uses these items, you should wash them thoroughly with soap and water.  Wash laundry thoroughly Immediately remove and wash clothes or bedding that have blood, body fluids, and/or secretions or excretions,  such as sweat, saliva, sputum, nasal mucus, vomit, urine, or feces, on them. Wear gloves when handling laundry from the patient. Read and follow directions on labels of laundry or clothing items and detergent. In general, wash and dry with the warmest temperatures recommended on the label.  Clean all areas the individual has used often Clean all touchable surfaces, such as counters, tabletops, doorknobs, bathroom fixtures, toilets, phones, keyboards, tablets, and bedside tables, every day. Also, clean any surfaces that may have blood, body fluids, and/or secretions or excretions on them. Wear gloves when cleaning surfaces the patient has come in contact with. Use a diluted bleach solution (e.g., dilute bleach with 1 part bleach and 10 parts water) or a household disinfectant with a label that says EPA-registered for coronaviruses. To make a bleach solution at home, add 1 tablespoon of bleach to 1 quart (4 cups) of water. For a larger supply, add  cup of bleach to 1 gallon (16 cups) of water. Read labels of cleaning products and follow recommendations provided on product labels. Labels contain instructions for safe and effective use of the cleaning product including precautions you should take when applying the product, such as wearing gloves or eye protection and making sure you have good ventilation during use of the product. Remove gloves and wash hands immediately after cleaning.  Monitor yourself for signs and symptoms of illness Caregivers and household members are considered close contacts,  should monitor their health, and will be asked to limit movement outside of the home to the extent possible. Follow the monitoring steps for close contacts listed on the symptom monitoring form.   ? If you have additional questions, contact your local health department or call the epidemiologist on call at [phone] (available 24/7). ? This guidance is subject to change. For the most up-to-date guidance from [facility], please refer to their website: [email or web address]   Increase activity slowly   Complete by: As directed      Allergies as of 10/22/2019   No Known Allergies     Medication List    STOP taking these medications   phytonadione 5 MG tablet Commonly known as: VITAMIN K   warfarin 7.5 MG tablet Commonly known as: COUMADIN     TAKE these medications   albuterol 108 (90 Base) MCG/ACT inhaler Commonly known as: VENTOLIN HFA Inhale 2 puffs into the lungs every 4 (four) hours as needed for wheezing or shortness of breath (for Grade 3 or 4 hypersensitivity reaction).   amLODipine 10 MG tablet Commonly known as: NORVASC Take 1 tablet (10 mg total) by mouth daily.   ascorbic acid 500 MG tablet Commonly known as: VITAMIN C Take 1 tablet (500 mg total) by mouth daily. Start taking on: October 23, 2019   atorvastatin 40 MG tablet Commonly known as: LIPITOR Take 1 tablet (40 mg total) by mouth daily.   cetirizine 10 MG tablet Commonly known as: ZYRTEC Take 10 mg by mouth daily.   citalopram 10 MG tablet Commonly known as: CELEXA Take 1 tablet (10 mg total) by mouth daily.   dexamethasone 6 MG tablet Commonly known as: DECADRON Take 1 tablet (6 mg total) by mouth daily for 5 days. Start taking on: October 23, 2019   enoxaparin 80 MG/0.8ML injection Commonly known as: LOVENOX Inject 0.675 mLs (67.5 mg total) into the skin every 12 (twelve) hours.   famotidine 20 MG tablet Commonly known as: Pepcid Take 1  tablet (20 mg total) by mouth daily.   folic acid 1  MG tablet Commonly known as: FOLVITE Take 1 tablet (1 mg total) by mouth daily. Start taking on: October 23, 2019   furosemide 20 MG tablet Commonly known as: LASIX Take 1 tablet (20 mg total) by mouth daily.   insulin glargine 100 UNIT/ML Solostar Pen Commonly known as: LANTUS Inject 7 Units into the skin daily. What changed:   how much to take  when to take this   levETIRAcetam 1000 MG tablet Commonly known as: Keppra Take 1 tablet (1,000 mg total) by mouth 2 (two) times daily.   metoprolol tartrate 25 MG tablet Commonly known as: LOPRESSOR Take 1 tablet (25 mg total) by mouth 2 (two) times daily.   morphine CONCENTRATE 10 MG/0.5ML Soln concentrated solution Place 0.25 mLs (5 mg total) under the tongue every 2 (two) hours as needed for moderate pain or shortness of breath.   multivitamin with minerals Tabs tablet Take 1 tablet by mouth daily. Start taking on: October 23, 2019   senna 8.6 MG Tabs tablet Commonly known as: SENOKOT Take 1 tablet (8.6 mg total) by mouth 2 (two) times daily.   thiamine 100 MG tablet Take 1 tablet (100 mg total) by mouth daily. Start taking on: October 23, 2019   UltiCare Micro Pen Needles 31G X 8 MM Misc Generic drug: Insulin Pen Needle by Does not apply route. Use for daily insulin injections   zinc sulfate 220 (50 Zn) MG capsule Take 1 capsule (220 mg total) by mouth daily. Start taking on: October 23, 2019      No Known Allergies  Follow-up Information    [facility] Schedule an appointment as soon as possible for a visit in 2 week(s).   Contact information: [address] [place] [address] [phone]        Antonieta Iba, MD. Schedule an appointment as soon as possible for a visit in 3 week(s).   Specialty: Cardiology Why: f/u in 2-3 weeks. Contact information: [address] [place] [address] [phone]         Morene Crocker, MD Follow up in 4 week(s).   Specialty: Neurology Contact information: [address] [facility] [employer] [place] [address] [phone]                The results of significant diagnostics from this hospitalization (including imaging, microbiology, ancillary and laboratory) are listed below for reference.    Significant Diagnostic Studies: EEG  Result Date: 10/17/2019 Charlsie Quest, MD     10/17/2019  3:22 PM Patient Name: Maxamilian Amadon MRN: [id number] Epilepsy Attending: Charlsie Quest Referring Physician/Provider: Dr. Georgiana Spinner Aroor Date: 10/17/2018 Duration: 23.08 minutes Patient history: 69 year old male with history of seizures not on any AEDs presented with generalized tonic-clonic seizure.  EEG to evaluate for seizures. Level of alertness: lethargic AEDs during EEG study: Keppra Technical aspects: This EEG study was done with scalp electrodes positioned according to the 10-20 International system of electrode placement. Electrical activity was acquired at a sampling rate of 500Hz  and reviewed with a high frequency filter of 70Hz  and a low frequency filter of 1Hz . EEG data were recorded continuously and digitally stored. Description: No posterior dominant rhythm was seen.  EEG showed continuous generalized 3-5 theta-delta slowing.  Hyperventilation and photic stimulation were not performed.   ABNORMALITY -Continuous slow, generalized IMPRESSION: This study is suggestive of moderate to severe diffuse encephalopathy, nonspecific to etiology.  No seizures or epileptiform discharges were seen throughout the  recording. Charlsie Quest   CT Head Wo Contrast  Result Date: 10/16/2019 CLINICAL DATA:  Seizure, COVID positive EXAM: CT HEAD WITHOUT CONTRAST TECHNIQUE: Contiguous axial images were obtained from the base of the skull through the vertex without intravenous contrast. COMPARISON:  2019 FINDINGS: Brain: There is no acute intracranial  hemorrhage, mass effect, or edema. No new loss of gray-white differentiation. Left greater than right frontal and left parietal encephalomalacia. Small chronic left thalamic infarct. Additional patchy hypoattenuation in the supratentorial white matter likely reflects similar chronic microvascular ischemic changes. Prominence of the ventricles and sulci reflects similar parenchymal volume loss. There is no extra-axial fluid collection. Vascular: There is atherosclerotic calcification at the skull base. Skull: Calvarium is unremarkable. Sinuses/Orbits: No acute finding. Other: Large occipital scalp lipoma. IMPRESSION: No acute intracranial abnormality. Stable chronic findings detailed above. Electronically Signed   By: Guadlupe Spanish M.D.   On: 10/16/2019 15:13   MR BRAIN WO CONTRAST  Result Date: 10/18/2019 CLINICAL DATA:  Not sick brain injury. EXAM: MRI HEAD WITHOUT CONTRAST TECHNIQUE: Multiplanar, multiecho pulse sequences of the brain and surrounding structures were obtained without intravenous contrast. COMPARISON:  Head CT from 2 days ago and brain MRI 06/15/2015 FINDINGS: Brain: Mildly diffusion hyperintense and ADC isointense abnormality along the left para median genu of the corpus callosum. Patchy remote cortical infarcts along the left frontal and parietal cortex. Remote perforator infarct at the left corona radiata. Chronic lacunar infarct at the left thalamus. No acute hemorrhage, hydrocephalus, or masslike finding. There is significant motion artifact. Vascular: Preserved flow voids with intracranial vascular tortuosity. Skull and upper cervical spine: Negative for marrow lesion Sinuses/Orbits: Negative. 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 IMPRESSION: 1. 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 2. Background of chronic ischemic injury and age advanced atrophy. 3. Significant motion degradation. Electronically Signed   By: Marnee Spring M.D.   On:  10/18/2019 05:23   US Carotid Bilateral (at [facility] and [facility] only)  Result Date: 10/19/2019 CLINICAL DATA:  Stroke-like symptoms EXAM: BILATERAL CAROTID DUPLEX ULTRASOUND TECHNIQUE: Wallace Cullens scale imaging, color Doppler and duplex ultrasound were performed of bilateral carotid and vertebral arteries in the neck. COMPARISON:  None. FINDINGS: Criteria: Quantification of carotid stenosis is based on velocity parameters that correlate the residual internal carotid diameter with NASCET-based stenosis levels, using the diameter of the distal internal carotid lumen as the denominator for stenosis measurement. The following velocity measurements were obtained: RIGHT ICA: 102/12 cm/sec CCA: 70/6 cm/sec SYSTOLIC ICA/CCA RATIO:  1.4 ECA:  42 cm/sec LEFT ICA: 93/19 cm/sec CCA: 70/6 cm/sec SYSTOLIC ICA/CCA RATIO:  1.3 ECA:  95 cm/sec RIGHT CAROTID ARTERY: Mild heterogeneous atherosclerotic plaque in the proximal internal carotid artery without evidence of stenosis. Estimated stenosis remains less than 50% by peak systolic velocity criteria. RIGHT VERTEBRAL ARTERY:  Patent with normal antegrade flow. LEFT CAROTID ARTERY: Mild heterogeneous atherosclerotic plaque in the proximal internal carotid artery. By peak systolic velocity criteria, the estimated stenosis remains less than 50%. LEFT VERTEBRAL ARTERY:  Patent with normal antegrade flow. IMPRESSION: 1. Mild (1-49%) stenosis proximal right internal carotid artery secondary to heterogenous atherosclerotic plaque. 2. Mild (1-49%) stenosis proximal left internal carotid artery secondary to heterogenous atherosclerotic plaque. 3. Vertebral arteries are patent with normal antegrade flow. Signed, Sterling Big, MD, RPVI [facility] Electronically Signed   By: Malachy Moan M.D.   On: 10/19/2019 09:26   US Venous Img Lower  Bilateral (DVT)  Result Date: 10/18/2019 CLINICAL DATA:  69 year old male with COVID-19, possible  DVT EXAM: BILATERAL LOWER EXTREMITY VENOUS DOPPLER ULTRASOUND TECHNIQUE: Gray-scale sonography with graded compression, as well as color Doppler and duplex ultrasound were performed to evaluate the lower extremity deep venous systems from the level of the common femoral vein and including the common femoral, femoral, profunda femoral, popliteal and calf veins including the posterior tibial, peroneal and gastrocnemius veins when visible. The superficial great saphenous vein was also interrogated. Spectral Doppler was utilized to evaluate flow at rest and with distal augmentation maneuvers in the common femoral, femoral and popliteal veins. COMPARISON:  None. FINDINGS: RIGHT LOWER EXTREMITY Common Femoral Vein: No evidence of thrombus. Normal compressibility, respiratory phasicity and response to augmentation. Saphenofemoral Junction: No evidence of thrombus. Normal compressibility and flow on color Doppler imaging. Profunda Femoral Vein: No evidence of thrombus. Normal compressibility and flow on color Doppler imaging. Femoral Vein: No evidence of thrombus. Normal compressibility, respiratory phasicity and response to augmentation. Popliteal Vein: No evidence of thrombus. Normal compressibility, respiratory phasicity and response to augmentation. Calf Veins: No evidence of thrombus. Normal compressibility and flow on color Doppler imaging. Superficial Great Saphenous Vein: No evidence of thrombus. Normal compressibility. Venous Reflux:  None. Other Findings:  None. LEFT LOWER EXTREMITY Common Femoral Vein: No evidence of thrombus. Normal compressibility, respiratory phasicity and response to augmentation. Saphenofemoral Junction: No evidence of thrombus. Normal compressibility and flow on color Doppler imaging. Profunda Femoral Vein: No evidence of thrombus. Normal compressibility and flow on color Doppler imaging. Femoral Vein: No evidence of thrombus. Normal compressibility, respiratory phasicity and response to  augmentation. Popliteal Vein: No evidence of thrombus. Normal compressibility, respiratory phasicity and response to augmentation. Calf Veins: No evidence of thrombus. Normal compressibility and flow on color Doppler imaging. Superficial Great Saphenous Vein: No evidence of thrombus. Normal compressibility. Venous Reflux:  None. Other Findings:  None. IMPRESSION: No evidence of deep venous thrombosis. Electronically Signed   By: Malachy Moan M.D.   On: 10/18/2019 08:03   DG Chest Portable 1 View  Result Date: 10/16/2019 CLINICAL DATA:  Seizure EXAM: PORTABLE CHEST 1 VIEW COMPARISON:  06/14/2015 FINDINGS: Cardiomegaly. Atherosclerotic calcification of the aortic knob. Prominent bibasilar interstitial markings. No pleural effusion or pneumothorax. No acute osseous findings. IMPRESSION: Cardiomegaly with prominent bibasilar interstitial markings, which may reflect mild interstitial edema versus atypical/viral infection. Electronically Signed   By: Duanne Guess D.O.   On: 10/16/2019 14:57   ECHOCARDIOGRAM COMPLETE  Result Date: 10/20/2019    ECHOCARDIOGRAM REPORT   Patient Name:   JEFFEY JANSSEN Cardella Date of Exam: 10/20/2019 Medical Rec #:  [id number]            Height:       66.0 in Accession #:    [id number]           Weight:       147.7 lb Date of Birth:  1951-01-14           BSA:          1.758 m Patient Age:    68 years             BP:           172/125 mmHg Patient Gender: M                    HR:           61 bpm. Exam Location:  [facility] Procedure: 2D Echo and Intracardiac  Opacification Agent Indications:     Stroke  History:         Patient has no prior history of Echocardiogram examinations. LA                  Thrombus, Stroke, Arrythmias:Atrial Fibrillation; Risk                  Factors:Hypertension, Diabetes and Cocaine Abuse.  Sonographer:     L Thornton-Maynard Referring Phys:  [id number] Lovey Newcomer Corey Caulfield Diagnosing Phys: Debbe Odea MD IMPRESSIONS  1. Left ventricular ejection fraction, by  estimation, is 30 to 35%. The left ventricle has moderate to severely decreased function. The left ventricle demonstrates global hypokinesis. There is moderate asymmetric left ventricular hypertrophy. Left ventricular diastolic parameters are consistent with Grade II diastolic dysfunction (pseudonormalization).  2. Right ventricular systolic function is normal. The right ventricular size is normal. Mildly increased right ventricular wall thickness. There is normal pulmonary artery systolic pressure.  3. Cannut rule out mass in the left atrium vs artifactual changes on current study. patient's rhythm is atrial flutter. recommends TEE to evaluate for thrombus if clinically indicated.. Left atrial size was severely dilated.  4. 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 . Evidence of atrial level shunting detected by color flow Doppler.  5. Right atrial size was mild to moderately dilated.  6. The mitral valve is normal in structure. Trivial mitral valve regurgitation.  7. The aortic valve is tricuspid. Aortic valve regurgitation is not visualized.  8. Aortic dilatation noted. There is borderline dilatation of the aortic root measuring 38 mm.  9. The inferior vena cava is normal in size with greater than 50% respiratory variability, suggesting right atrial pressure of 3 mmHg. FINDINGS  Left Ventricle: Left ventricular ejection fraction, by estimation, is 30 to 35%. The left ventricle has moderate to severely decreased function. The left ventricle demonstrates global hypokinesis. Definity contrast agent was given IV to delineate the left ventricular endocardial borders. The left ventricular internal cavity size was normal in size. There is moderate asymmetric left ventricular hypertrophy. Left ventricular diastolic parameters are consistent with Grade II diastolic dysfunction (pseudonormalization). Right Ventricle: The right ventricular size is normal. Mildly increased right ventricular wall thickness. Right ventricular  systolic function is normal. There is normal pulmonary artery systolic pressure. The tricuspid regurgitant velocity is 2.56 m/s, and with an assumed right atrial pressure of 3 mmHg, the estimated right ventricular systolic pressure is 29.2 mmHg. Left Atrium: Cannut rule out mass in the left atrium vs artifactual changes on current study. patient's rhythm is atrial flutter. recommends TEE to evaluate for thrombus if clinically indicated. Left atrial size was severely dilated. Right Atrium: Right atrial size was mild to moderately dilated. Pericardium: Trivial pericardial effusion is present. Mitral Valve: The mitral valve is normal in structure. Trivial mitral valve regurgitation. MV peak gradient, 1.2 mmHg. The mean mitral valve gradient is 0.0 mmHg. Tricuspid Valve: The tricuspid valve is grossly normal. Tricuspid valve regurgitation is not demonstrated. Aortic Valve: The aortic valve is tricuspid. Aortic valve regurgitation is not visualized. Aortic valve mean gradient measures 1.0 mmHg. Aortic valve peak gradient measures 2.0 mmHg. Aortic valve area, by VTI measures 2.52 cm. Pulmonic Valve: The pulmonic valve was not well visualized. Pulmonic valve regurgitation is not visualized. Aorta: Aortic dilatation noted. There is borderline dilatation of the aortic root measuring 38 mm. Venous: The inferior vena cava is normal in size with greater than 50% respiratory variability, suggesting right atrial pressure of  3 mmHg. IAS/Shunts: There is right bowing of the interatrial septum, suggestive of elevated left atrial pressure. Evidence of atrial level shunting detected by color flow Doppler.  LEFT VENTRICLE PLAX 2D LVIDd:         4.22 cm     Diastology LVIDs:         3.76 cm     LV e' lateral:   2.34 cm/s LV PW:         1.81 cm     LV E/e' lateral: 23.2 LV IVS:        2.00 cm     LV e' medial:    3.33 cm/s LVOT diam:     2.10 cm     LV E/e' medial:  16.3 LV SV:         29 LV SV Index:   16 LVOT Area:     3.46 cm  LV  Volumes (MOD) LV vol d, MOD A2C: 62.7 ml LV vol d, MOD A4C: 79.9 ml LV vol s, MOD A2C: 46.0 ml LV vol s, MOD A4C: 36.2 ml LV SV MOD A2C:     16.8 ml LV SV MOD A4C:     79.9 ml LV SV MOD BP:      28.5 ml RIGHT VENTRICLE RV S prime:     5.70 cm/s TAPSE (M-mode): 1.8 cm LEFT ATRIUM              Index LA diam:        5.30 cm  3.01 cm/m LA Vol (A2C):   162.0 ml 92.14 ml/m LA Vol (A4C):   211.0 ml 120.02 ml/m LA Biplane Vol: 195.0 ml 110.91 ml/m  AORTIC VALVE                   PULMONIC VALVE AV Area (Vmax):    2.43 cm    PV Vmax:       0.94 m/s AV Area (Vmean):   2.50 cm    PV Peak grad:  3.5 mmHg AV Area (VTI):     2.52 cm AV Vmax:           70.70 cm/s AV Vmean:          46.600 cm/s AV VTI:            0.113 m AV Peak Grad:      2.0 mmHg AV Mean Grad:      1.0 mmHg LVOT Vmax:         49.50 cm/s LVOT Vmean:        33.600 cm/s LVOT VTI:          0.082 m LVOT/AV VTI ratio: 0.73  AORTA Ao Root diam: 3.70 cm MITRAL VALVE               TRICUSPID VALVE MV Area (PHT): 2.99 cm    TR Peak grad:   26.2 mmHg MV Peak grad:  1.2 mmHg    TR Vmax:        256.00 cm/s MV Mean grad:  0.0 mmHg MV Vmax:       0.55 m/s    SHUNTS MV Vmean:      31.1 cm/s   Systemic VTI:  0.08 m MV Decel Time: 254 msec    Systemic Diam: 2.10 cm MV E velocity: 54.30 cm/s Debbe Odea MD Electronically signed by Debbe Odea MD Signature Date/Time: 10/20/2019/12:46:55 PM    Final     Microbiology: Recent Results (from the past  240 hour(s))  SARS Coronavirus 2 by RT PCR (hospital order, performed in [facility] hospital lab) Nasopharyngeal Nasopharyngeal Swab     Status: Abnormal   Collection Time: 10/16/19  3:35 PM   Specimen: Nasopharyngeal Swab  Result Value Ref Range Status   SARS Coronavirus 2 POSITIVE (A) NEGATIVE Final    Comment: RESULT CALLED TO, READ BACK BY AND VERIFIED WITH: MITCH BARKER @1722  10/16/19 MJU (NOTE) SARS-CoV-2 target nucleic acids are DETECTED  SARS-CoV-2 RNA is generally detectable in upper respiratory  specimens  during the acute phase of infection.  Positive results are indicative  of the presence of the identified virus, but do not rule out bacterial infection or co-infection with other pathogens not detected by the test.  Clinical correlation with patient history and  other diagnostic information is necessary to determine patient infection status.  The expected result is negative.  Fact Sheet for Patients:   12/16/19   Fact Sheet for Healthcare Providers:   BoilerBrush.com.cy    This test is not yet approved or cleared by the https://pope.com/ FDA and  has been authorized for detection and/or diagnosis of SARS-CoV-2 by FDA under an Emergency Use Authorization (EUA).  This EUA will remain in effect (meaning this test ca n be used) for the duration of  the COVID-19 declaration under Section 564(b)(1) of the Act, 21 U.S.C. section 360-bbb-3(b)(1), unless the authorization is terminated or revoked sooner.  Performed at [facility], [address] [place], [place]   Urine Culture     Status: Abnormal   Collection Time: 10/16/19  7:51 PM   Specimen: Urine, Random  Result Value Ref Range Status   Specimen Description   Final    URINE, RANDOM Performed at [facility], [address] [place], [place]    Special Requests   Final    NONE Performed at [facility], [address] [place], [place]    Culture MULTIPLE SPECIES PRESENT, SUGGEST RECOLLECTION (A)  Final   Report Status 10/18/2019 FINAL  Final     Labs: Basic Metabolic Panel: Recent Labs  Lab 10/17/19 1027 10/17/19 1027 10/18/19 0522 10/19/19 0622 10/20/19 1017 10/21/19 0458 10/22/19 0613  NA 139   < > 139 142 142 141 137  K 4.0   < > 4.7 4.5 3.6 3.7 3.7  CL 102   < > 103 105 105 104 101  CO2 26   < > 20* 26 25 25 27   GLUCOSE 157*   < > 161* 145* 83 175* 106*  BUN 28*   < > 24* 28* 22 23 22    CREATININE 1.44*   < > 1.63* 1.49* 1.03 1.01 1.06  CALCIUM 8.8*   < > 8.8* 8.9 8.9 8.7* 8.4*  MG 2.1   < > 2.0 2.1 1.9 1.7 2.3  PHOS 1.8*  --  3.7 2.5 2.3* 2.0*  --    < > = values in this interval not displayed.   Liver Function Tests: Recent Labs  Lab 10/18/19 0522 10/19/19 0622 10/20/19 1017 10/21/19 0458 10/22/19 0613  AST 38 35 33 28 25  ALT 14 15 14 14 12   ALKPHOS 52 53 59 57 52  BILITOT 1.4* 0.9 0.7 0.6 0.6  PROT 6.9 7.0 7.3 6.7 6.6  ALBUMIN 3.1* 3.1* 3.2* 2.8* 2.8*   Recent Labs  Lab 10/16/19 1420  LIPASE 38   No results for input(s): AMMONIA in the last 168 hours. CBC: Recent Labs  Lab 10/18/19 0522 10/19/19 0622 10/20/19 1017 10/21/19 0458 10/22/19 0613  WBC 8.3 3.7* 3.0* 2.9* 2.3*  NEUTROABS 7.1 2.8 2.0 1.8 1.2*  HGB 16.1 15.4 16.4 14.6 14.2  HCT 52.3* 48.0 50.7 44.7 43.8  MCV 92.6 88.2 87.1 85.3 86.7  PLT 113* 146* 170 192 197   Cardiac Enzymes: Recent Labs  Lab 10/16/19 1420  CKTOTAL 374   BNP: BNP (last 3 results) Recent Labs    10/16/19 1420 10/17/19 1027 10/18/19 0522  BNP 283.7* 200.4* 515.0*    ProBNP (last 3 results) No results for input(s): PROBNP in the last 8760 hours.  CBG: Recent Labs  Lab 10/21/19 1659 10/21/19 2124 10/21/19 2204 10/22/19 0752 10/22/19 1147  GLUCAP 81 66* 70 108* 100*       Signed:  Ramiro Harvest MD.  [facility] 10/22/2019, 2:58 PM

## 2019-10-22 NOTE — Consult Note (Signed)
ANTICOAGULATION CONSULT NOTE  Pharmacy Consult for Lovenox  Indication: atrial fibrillation/LAA clot  No Known Allergies  Patient Measurements: Height: 5\' 6"  (167.6 cm) Weight: 67 kg (147 lb 11.3 oz) IBW/kg (Calculated) : 63.8  Vital Signs: Temp: 97.5 F (36.4 C) (09/07 0752) Temp Source: Oral (09/07 0608) BP: 170/85 (09/07 0752) Pulse Rate: 57 (09/07 0752)  Labs: Recent Labs    10/20/19 1017 10/20/19 1017 10/21/19 0458 10/22/19 0613  HGB 16.4   < > 14.6 14.2  HCT 50.7  --  44.7 43.8  PLT 170  --  192 197  CREATININE 1.03  --  1.01  --    < > = values in this interval not displayed.    Estimated Creatinine Clearance: 62.3 mL/min (by C-G formula based on SCr of 1.01 mg/dL).   Medical History: Past Medical History:  Diagnosis Date  . Diabetes mellitus without complication (HCC)   . Hypertension   . Seizures (HCC)   . Stroke [facility])     Medications:  Pt reported home dose of warfarin is 7.5mg  daily, with last dosing currently unknown and admission INR of 1.2.  Pt was started on phenytoin for seizures since admission, which can enhance anticoagulant effect of warfarin, but has since been changed to Keppra (on 9/2), which has no documented interaction with warfarin.  Assessment: 69 yo male with hx of coagulation disorder, hx of CVA, permanent a fib, hx of LA thrombus and cocaine use with recent seizures (including in ER triage) and new COVID infection.  Pharmacy has been consulted to resume and monitor home warfarin therapy w/therapeutic enoxaparin for bridging.    Date INR Warfarin dose  9/1 1.2 --  9/2 1.1 7.5mg   9/3 1.4 7.5mg  (not administered)  9/4 1.5 7.5 mg   Warfarin has been discontinued and patient will remain on Lovenox  Inpatient and on discharge, per notes by attending.   Goal of Therapy:  INR 2-3 Monitor platelets by anticoagulation protocol: Yes   Plan:   ENOXAPARIN:  Continue therapeutic enoxaparin at 1 mg/kg q12 hours. Will continued to  monitor CBC and Scr.   11/4, PharmD Clinical Pharmacist 10/22/2019 8:21 AM

## 2019-10-24 ENCOUNTER — Ambulatory Visit: Payer: Medicare Other | Admitting: Podiatry

## 2019-11-06 NOTE — Progress Notes (Deleted)
Cardiology Office Note    Date:  11/06/2019   ID:  Girtha Hake, DOB 06-Jun-1950, MRN [id number]  PCP:  [facility]  Cardiologist:  Julien Nordmann, MD  Electrophysiologist:  None   Chief Complaint: Hospital follow up  History of Present Illness:   Sakib Noguez is a 69 y.o. 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   Prior echo, as read by outside cardiology group in 06/2014, showed an EF of 60%, Gr1DD, mild mitral regurgitation, severe biatrial enlargement, and a left atrial thrombus.   Echo in 12/2017 showed an EF of 55-60%, no RWMA, mild mitral regurgitation, severely dilated left atrium, mildly dilated right ventricle with mildly reduced RV systolic function, and PASP 59 mmHg. Select images concerning for atrial thrombus.   He was seen virtually in 07/2018. No changes were made. INR was noted to be monitored through his PCP's office.   He was admitted to [facility] from 9/1 to 9/7 with metabolic encephalopathy felt to be secondary to break through seizure, subacute CVA, and failure to thrive with poor oral intake. He was also noted to have a COVID-19 infection s/p MAb and steroids, Afib with subtherapeutic INR, HFrEF, acute on CKD stage III. Cardiac enzymes were not cycled. BNP 283-->515. Urine drug screen was positive for cocaine. D dimer > 10,000. CT head was without acute findings. MRI brain showed a subacute infarct. Carotid artery ultrasound showed 1-49%  bilateral ICA stenosis with patent antegrade flow of the bilateral vertebral arteries. Lower extremity ultrasound was negative for DVT bilaterally.   Echo showed a newly reduced LVSF with an EF of 30-35%, global hypokinesis, moderate asymmetric LVH, Gr2DD, normal RV systolic function with normal RV cavity size and mildly increased RV wall thickness, normal PASP, severely dilated left atrium, unable to exclude mass in the left atrium vs artifactual changes, left to right atrial level shunt noted, moderately dilated right atrium, trivial mitral regurgitation, and borderline dilation of the aortic root was noted measuring 38 mm. With regards to his underlying Afib and possible left atrial mass, he was transitioned from warfarin, given subtherapeutic INR upon presentation, and discharged on Lovenox.   He was consulted on by palliative care with family wishes to transition to full comfort care with home hospice. Please see discharge summary for full details.   ***   Labs independently reviewed: 10/2019 - magnesium 2.3, potassium 3.7, BUN 22, SCr 1.06, albumin 2.8, AST/ALT normal, HGB 14.2, PLT 197, TC 128, TG 106, HDL 43, LDL 64, A1c 8.5  Past Medical History:  Diagnosis Date  . Diabetes mellitus without complication (HCC)   . Hypertension   . Seizures (HCC)   . Stroke [facility])     Past Surgical History:  Procedure Laterality Date  . none      Current Medications: No outpatient medications have been marked as taking for the 11/12/19 encounter (Appointment) with Sondra Barges, PA-C.    Allergies:   Patient has no known allergies.   Social History   Socioeconomic History  . Marital  status: Married    Spouse name: Not on file  . Number of children: Not on file  . Years of education: Not on file  . Highest education level: Not on file  Occupational History  . Occupation: retired  Tobacco Use  . Smoking status: Former Smoker    Packs/day: 0.50    Years: 10.00    Pack years: 5.00     Types: Cigarettes    Quit date: 07/12/1997    Years since quitting: 22.3  . Smokeless tobacco: Never Used  Vaping Use  . Vaping Use: Never assessed  Substance and Sexual Activity  . Alcohol use: No    Alcohol/week: 0.0 standard drinks  . Drug use: No  . Sexual activity: Never  Other Topics Concern  . Not on file  Social History Narrative  . Not on file   Social Determinants of Health   Financial Resource Strain:   . Difficulty of Paying Living Expenses: Not on file  Food Insecurity:   . Worried About Programme researcher, broadcasting/film/video in the Last Year: Not on file  . Ran Out of Food in the Last Year: Not on file  Transportation Needs:   . Lack of Transportation (Medical): Not on file  . Lack of Transportation (Non-Medical): Not on file  Physical Activity:   . Days of Exercise per Week: Not on file  . Minutes of Exercise per Session: Not on file  Stress:   . Feeling of Stress : Not on file  Social Connections:   . Frequency of Communication with Friends and Family: Not on file  . Frequency of Social Gatherings with Friends and Family: Not on file  . Attends Religious Services: Not on file  . Active Member of Clubs or Organizations: Not on file  . Attends Banker Meetings: Not on file  . Marital Status: Not on file     Family History:  The patient's family history includes Cancer in his mother; Diabetes Mellitus II in his father.  ROS:   ROS   EKGs/Labs/Other Studies Reviewed:    Studies reviewed were summarized above. The additional studies were reviewed today:  2D echo 10/20/2019: 1. Left ventricular ejection fraction, by estimation, is 30 to 35%. The  left ventricle has moderate to severely decreased function. The left  ventricle demonstrates global hypokinesis. There is moderate asymmetric  left ventricular hypertrophy. Left  ventricular diastolic parameters are consistent with Grade II diastolic  dysfunction (pseudonormalization).  2. Right ventricular  systolic function is normal. The right ventricular  size is normal. Mildly increased right ventricular wall thickness. There  is normal pulmonary artery systolic pressure.  3. Cannut rule out mass in the left atrium vs artifactual changes on  current study. patient's rhythm is atrial flutter. recommends TEE to  evaluate for thrombus if clinically indicated.. Left atrial size was  severely dilated.  4. 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 . Evidence of atrial level  shunting detected by color flow Doppler.  5. Right atrial size was mild to moderately dilated.  6. The mitral valve is normal in structure. Trivial mitral valve  regurgitation.  7. The aortic valve is tricuspid. Aortic valve regurgitation is not  visualized.  8. Aortic dilatation noted. There is borderline dilatation of the aortic  root measuring 38 mm.  9. The inferior vena cava is normal in size with greater than 50%  respiratory variability, suggesting right atrial pressure of 3 mmHg. __________  2D echo 12/2017: - Left ventricle:  The cavity size was normal. There was moderate  concentric hypertrophy. Systolic function was normal. The  estimated ejection fraction was in the range of 55% to 60%. Wall  motion was normal; there were no regional wall motion  abnormalities. The study is not technically sufficient to allow  evaluation of LV diastolic function.  - Mitral valve: There was mild regurgitation.  - Left atrium: The atrium was severely dilated.  - Right ventricle: The cavity size was mildly dilated. Wall  thickness was normal. Systolic function was mildly reduced.  - Right atrium: The atrium was severely dilated.  - Pulmonary arteries: Systolic pressure was moderate to severely  elevated. PA peak pressure: 59 mm Hg (S).   Impressions:   - Select images concerning for atrium thrombus. Consider a TEE if  clinically indicated.  __________  2D echo 06/2014: - Left ventricle: The  cavity size was normal. The estimated  ejection fraction was 60%. Doppler parameters are consistent with  abnormal left ventricular relaxation (grade 1 diastolic  dysfunction).  - Aortic valve: Valve area (Vmax): 3.71 cm^2.  - Mitral valve: There was mild regurgitation.  - Left atrium: The atrium was severely dilated.  - Right atrium: The atrium was severely dilated.   Impressions:   - Left atrial thrombus.   EKG:  EKG is ordered today.  The EKG ordered today demonstrates ***  Recent Labs: 10/18/2019: B Natriuretic Peptide 515.0 10/22/2019: ALT 12; BUN 22; Creatinine, Ser 1.06; Hemoglobin 14.2; Magnesium 2.3; Platelets 197; Potassium 3.7; Sodium 137  Recent Lipid Panel    Component Value Date/Time   CHOL 128 10/19/2019 0622   TRIG 106 10/19/2019 0622   HDL 43 10/19/2019 0622   CHOLHDL 3.0 10/19/2019 0622   VLDL 21 10/19/2019 0622   LDLCALC 64 10/19/2019 0622    PHYSICAL EXAM:    VS:  There were no vitals taken for this visit.  BMI: There is no height or weight on file to calculate BMI.  Physical Exam  Wt Readings from Last 3 Encounters:  10/16/19 147 lb 11.3 oz (67 kg)  12/19/17 148 lb 1.6 oz (67.2 kg)  06/14/15 144 lb 1.6 oz (65.4 kg)     ASSESSMENT & PLAN:   1. ***  Disposition: F/u with Dr. Mariah Milling or an APP in ***.   Medication Adjustments/Labs and Tests Ordered: Current medicines are reviewed at length with the patient today.  Concerns regarding medicines are outlined above. Medication changes, Labs and Tests ordered today are summarized above and listed in the Patient Instructions accessible in Encounters.   Signed, Eula Listen, PA-C 11/06/2019 10:17 AM     [facility] [address] [place], [place] [address] [phone]

## 2019-11-12 ENCOUNTER — Ambulatory Visit: Payer: Medicare Other | Admitting: Physician Assistant

## 2019-11-13 ENCOUNTER — Encounter: Payer: Self-pay | Admitting: Physician Assistant

## 2020-01-01 ENCOUNTER — Ambulatory Visit: Admitting: Family

## 2020-01-23 ENCOUNTER — Emergency Department

## 2020-01-23 ENCOUNTER — Observation Stay

## 2020-01-23 ENCOUNTER — Observation Stay
Admission: EM | Admit: 2020-01-23 | Discharge: 2020-01-24 | Disposition: A | Discharge status: Home / Self Care | Attending: Internal Medicine | Admitting: Internal Medicine

## 2020-01-23 ENCOUNTER — Other Ambulatory Visit: Payer: Self-pay

## 2020-01-23 DIAGNOSIS — R778 Other specified abnormalities of plasma proteins: Secondary | ICD-10-CM | POA: Diagnosis not present

## 2020-01-23 DIAGNOSIS — G9341 Metabolic encephalopathy: Secondary | ICD-10-CM | POA: Diagnosis not present

## 2020-01-23 DIAGNOSIS — I110 Hypertensive heart disease with heart failure: Secondary | ICD-10-CM | POA: Diagnosis not present

## 2020-01-23 DIAGNOSIS — R739 Hyperglycemia, unspecified: Secondary | ICD-10-CM | POA: Diagnosis present

## 2020-01-23 DIAGNOSIS — Z79899 Other long term (current) drug therapy: Secondary | ICD-10-CM | POA: Diagnosis not present

## 2020-01-23 DIAGNOSIS — K5730 Diverticulosis of large intestine without perforation or abscess without bleeding: Secondary | ICD-10-CM | POA: Diagnosis not present

## 2020-01-23 DIAGNOSIS — I4821 Permanent atrial fibrillation: Secondary | ICD-10-CM | POA: Diagnosis not present

## 2020-01-23 DIAGNOSIS — Z20822 Contact with and (suspected) exposure to covid-19: Secondary | ICD-10-CM | POA: Diagnosis not present

## 2020-01-23 DIAGNOSIS — R1032 Left lower quadrant pain: Secondary | ICD-10-CM

## 2020-01-23 DIAGNOSIS — I700 Atherosclerosis of aorta: Secondary | ICD-10-CM | POA: Diagnosis not present

## 2020-01-23 DIAGNOSIS — Z794 Long term (current) use of insulin: Secondary | ICD-10-CM | POA: Insufficient documentation

## 2020-01-23 DIAGNOSIS — E1165 Type 2 diabetes mellitus with hyperglycemia: Secondary | ICD-10-CM | POA: Insufficient documentation

## 2020-01-23 DIAGNOSIS — M47816 Spondylosis without myelopathy or radiculopathy, lumbar region: Secondary | ICD-10-CM | POA: Diagnosis not present

## 2020-01-23 DIAGNOSIS — M5137 Other intervertebral disc degeneration, lumbosacral region: Secondary | ICD-10-CM | POA: Insufficient documentation

## 2020-01-23 DIAGNOSIS — I5022 Chronic systolic (congestive) heart failure: Secondary | ICD-10-CM | POA: Insufficient documentation

## 2020-01-23 DIAGNOSIS — Z87891 Personal history of nicotine dependence: Secondary | ICD-10-CM | POA: Insufficient documentation

## 2020-01-23 DIAGNOSIS — R4182 Altered mental status, unspecified: Principal | ICD-10-CM | POA: Insufficient documentation

## 2020-01-23 DIAGNOSIS — R109 Unspecified abdominal pain: Secondary | ICD-10-CM | POA: Diagnosis present

## 2020-01-23 DIAGNOSIS — I5189 Other ill-defined heart diseases: Secondary | ICD-10-CM

## 2020-01-23 DIAGNOSIS — I513 Intracardiac thrombosis, not elsewhere classified: Secondary | ICD-10-CM | POA: Diagnosis present

## 2020-01-23 DIAGNOSIS — M5136 Other intervertebral disc degeneration, lumbar region: Secondary | ICD-10-CM | POA: Diagnosis not present

## 2020-01-23 DIAGNOSIS — E119 Type 2 diabetes mellitus without complications: Secondary | ICD-10-CM

## 2020-01-23 DIAGNOSIS — R569 Unspecified convulsions: Secondary | ICD-10-CM

## 2020-01-23 LAB — CBC WITH DIFFERENTIAL/PLATELET
Abs Immature Granulocytes: 0.02 10*3/uL (ref 0.00–0.07)
Basophils Absolute: 0 10*3/uL (ref 0.0–0.1)
Basophils Relative: 0 %
Eosinophils Absolute: 0 10*3/uL (ref 0.0–0.5)
Eosinophils Relative: 1 %
HCT: 47.3 % (ref 39.0–52.0)
Hemoglobin: 15.3 g/dL (ref 13.0–17.0)
Immature Granulocytes: 0 %
Lymphocytes Relative: 19 %
Lymphs Abs: 0.9 10*3/uL (ref 0.7–4.0)
MCH: 29 pg (ref 26.0–34.0)
MCHC: 32.3 g/dL (ref 30.0–36.0)
MCV: 89.6 fL (ref 80.0–100.0)
Monocytes Absolute: 0.4 10*3/uL (ref 0.1–1.0)
Monocytes Relative: 9 %
Neutro Abs: 3.4 10*3/uL (ref 1.7–7.7)
Neutrophils Relative %: 71 %
Platelets: 159 10*3/uL (ref 150–400)
RBC: 5.28 MIL/uL (ref 4.22–5.81)
RDW: 14.1 % (ref 11.5–15.5)
WBC: 4.8 10*3/uL (ref 4.0–10.5)
nRBC: 0 % (ref 0.0–0.2)

## 2020-01-23 LAB — BLOOD GAS, VENOUS
Acid-Base Excess: 5.5 mmol/L — ABNORMAL HIGH (ref 0.0–2.0)
Bicarbonate: 32.5 mmol/L — ABNORMAL HIGH (ref 20.0–28.0)
O2 Saturation: 33.3 %
Patient temperature: 37
pCO2, Ven: 55 mmHg (ref 44.0–60.0)
pH, Ven: 7.38 (ref 7.250–7.430)
pO2, Ven: <31 mmHg — CL (ref 32.0–45.0)

## 2020-01-23 LAB — URINALYSIS, COMPLETE (UACMP) WITH MICROSCOPIC
Bacteria, UA: NONE SEEN
Bilirubin Urine: NEGATIVE
Glucose, UA: >=500 mg/dL — AB
Hgb urine dipstick: NEGATIVE
Ketones, ur: NEGATIVE mg/dL
Leukocytes,Ua: NEGATIVE
Nitrite: NEGATIVE
Protein, ur: 100 mg/dL — AB
Specific Gravity, Urine: 1.025 (ref 1.005–1.030)
Squamous Epithelial / HPF: NONE SEEN (ref 0–5)
pH: 7 (ref 5.0–8.0)

## 2020-01-23 LAB — COMPREHENSIVE METABOLIC PANEL
ALT: 12 U/L (ref 0–44)
AST: 19 U/L (ref 15–41)
Albumin: 3.7 g/dL (ref 3.5–5.0)
Alkaline Phosphatase: 85 U/L (ref 38–126)
Anion gap: 11 (ref 5–15)
BUN: 24 mg/dL — ABNORMAL HIGH (ref 8–23)
CO2: 28 mmol/L (ref 22–32)
Calcium: 10.2 mg/dL (ref 8.9–10.3)
Chloride: 98 mmol/L (ref 98–111)
Creatinine, Ser: 1.07 mg/dL (ref 0.61–1.24)
GFR, Estimated: >60 mL/min (ref >60)
Glucose, Bld: 442 mg/dL — ABNORMAL HIGH (ref 70–99)
Potassium: 5.2 mmol/L — ABNORMAL HIGH (ref 3.5–5.1)
Sodium: 137 mmol/L (ref 135–145)
Total Bilirubin: 0.9 mg/dL (ref 0.3–1.2)
Total Protein: 7.9 g/dL (ref 6.5–8.1)

## 2020-01-23 LAB — TROPONIN I (HIGH SENSITIVITY)
Troponin I (High Sensitivity): 174 ng/L (ref <18)
Troponin I (High Sensitivity): 179 ng/L (ref <18)

## 2020-01-23 LAB — LACTIC ACID, PLASMA
Lactic Acid, Venous: 2.1 mmol/L (ref 0.5–1.9)
Lactic Acid, Venous: 1.7 mmol/L (ref 0.5–1.9)

## 2020-01-23 LAB — CBG MONITORING, ED: Glucose-Capillary: 331 mg/dL — ABNORMAL HIGH (ref 70–99)

## 2020-01-23 LAB — LIPASE, BLOOD: Lipase: 36 U/L (ref 11–51)

## 2020-01-23 LAB — RESP PANEL BY RT-PCR (FLU A&B, COVID) ARPGX2
Influenza A by PCR: NEGATIVE
Influenza B by PCR: NEGATIVE
SARS Coronavirus 2 by RT PCR: NEGATIVE

## 2020-01-23 LAB — GLUCOSE, CAPILLARY: Glucose-Capillary: 249 mg/dL — ABNORMAL HIGH (ref 70–99)

## 2020-01-23 MED ORDER — METOPROLOL TARTRATE 25 MG PO TABS
25.0000 mg | ORAL_TABLET | Freq: Two times a day (BID) | ORAL | Status: DC
  Administered 2020-01-23 – 2020-01-24 (×2): 25 mg via ORAL
  Filled 2020-01-23 (×2): qty 1

## 2020-01-23 MED ORDER — SODIUM CHLORIDE 0.9 % IV SOLN: INTRAVENOUS | Status: DC

## 2020-01-23 MED ORDER — MORPHINE SULFATE (PF) 4 MG/ML IV SOLN: 4.0000 mg | INTRAVENOUS | Status: DC | PRN

## 2020-01-23 MED ORDER — ACETAMINOPHEN 325 MG PO TABS: 650.0000 mg | ORAL_TABLET | Freq: Four times a day (QID) | ORAL | Status: DC | PRN

## 2020-01-23 MED ORDER — INSULIN GLARGINE 100 UNIT/ML ~~LOC~~ SOLN
5.0000 [IU] | Freq: Two times a day (BID) | SUBCUTANEOUS | Status: DC
  Administered 2020-01-23 – 2020-01-24 (×2): 5 [IU] via SUBCUTANEOUS
  Filled 2020-01-23 (×3): qty 0.05

## 2020-01-23 MED ORDER — INSULIN ASPART 100 UNIT/ML ~~LOC~~ SOLN
0.0000 [IU] | Freq: Three times a day (TID) | SUBCUTANEOUS | Status: DC
  Administered 2020-01-23 – 2020-01-24 (×2): 7 [IU] via SUBCUTANEOUS
  Filled 2020-01-23 (×3): qty 1

## 2020-01-23 MED ORDER — IOHEXOL 300 MG/ML  SOLN
100.0000 mL | Freq: Once | INTRAMUSCULAR | Status: AC | PRN
  Administered 2020-01-23: 100 mL via INTRAVENOUS

## 2020-01-23 MED ORDER — SODIUM CHLORIDE 0.9 % IV BOLUS
500.0000 mL | Freq: Once | INTRAVENOUS | Status: AC
  Administered 2020-01-23: 500 mL via INTRAVENOUS

## 2020-01-23 MED ORDER — DOCUSATE SODIUM 100 MG PO CAPS
100.0000 mg | ORAL_CAPSULE | Freq: Two times a day (BID) | ORAL | Status: DC
  Administered 2020-01-23 – 2020-01-24 (×2): 100 mg via ORAL
  Filled 2020-01-23 (×2): qty 1

## 2020-01-23 MED ORDER — MORPHINE SULFATE (PF) 4 MG/ML IV SOLN
4.0000 mg | Freq: Once | INTRAVENOUS | Status: AC
  Administered 2020-01-23: 4 mg via INTRAVENOUS
  Filled 2020-01-23: qty 1

## 2020-01-23 MED ORDER — ACETAMINOPHEN 650 MG RE SUPP: 650.0000 mg | Freq: Four times a day (QID) | RECTAL | Status: DC | PRN

## 2020-01-23 MED ORDER — ONDANSETRON HCL 4 MG/2ML IJ SOLN
4.0000 mg | Freq: Once | INTRAMUSCULAR | Status: AC
  Administered 2020-01-23: 4 mg via INTRAVENOUS
  Filled 2020-01-23: qty 2

## 2020-01-23 NOTE — Progress Notes (Signed)
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 He is a Full code. Patient was sent to the [facility] ED today at request of his daughter for evaluation of decreased responsiveness, changes from baseline. He was evaluated by his hospice nurse prior to transport.  Patient seen lying on the emergency room stretcher.  Writer spoke in the room with patient's daughter and primary care giver Olivia Mackie and to Emergency room physician Dr. Marisa Severin.  Chest CT did reveal a possible blood clot in the atrium.  At this time Olivia Mackie does not wish to pursue an invasive procedures. She is in agreement with her father receiving IV fluids and treating his elevated blood sugar. Of note patient is not currently receiving any insulin or oral antidiabetics at home as these were discontinued on previous discharge in September 2021.  Patient is bed bound at baseline, can answer simple questions that require one word or yes/no answers, speech is slightly slurred. He does eat well, but requires feeding per Olivia Mackie. Plan per discussion with Olivia Mackie would be for patient to return home once medically stable. Dr. Marisa Severin aware. Attempted to update hospice TOC, voice mail message left. Hospice team updated. Liaison to follow and assist with discharge planning.  Dayna Barker BSN, [facility] Liaison [facility] [phone]

## 2020-01-23 NOTE — H&P (Signed)
History and Physical    PLEASE NOTE THAT DRAGON DICTATION SOFTWARE WAS USED IN THE CONSTRUCTION OF THIS NOTE.   Lee Gonzalez YWV:[id number] DOB: 04/20/1950 DOA: 01/23/2020  PCP: System, Provider Not In Patient coming from: home   I have personally briefly reviewed patient's old medical records in [place]  Chief Complaint: Altered mental status  HPI: Lee Gonzalez is a 69 y.o. male with medical history significant for acute ischemic CVA in September 2021, seizure disorder, chronic combined systolic/diastolic heart failure, with most recent echocardiogram in September 2021 showing LVEF 30 to 35% with grade 2 diastolic dysfunction, dementia, type 2 diabetes mellitus, hypertension, who is admitted to [facility] on 01/23/2020 with hyperglycemia in the absence of DKA or HH NK after presenting from home on home hospice to [facility] Emergency Department for evaluation of altered mental status.   In the setting of the patient's altered mental status, the following history is provided by the patient's daughter, as well as via my discussions with the emergency department and via chart review.  Patient's most recent prior hospitalization is of note, having occurred in September 2021, at which time he was diagnosed with an acute ischemic stroke as well as new onset combined systolic/diastolic after presenting to the ED for evaluation of altered mental status.  Over the course that hospitalization, he was transitioned to comfort care measures exclusively, he discharged home in that capacity.  Over the ensuing weeks he reportedly showed some improvement in his confusion and somnolence such that he has subsequently resumed home hospice and a few of his outpatient medications.   Over the last 4 to 5 days however, is reported that the patient has appeared more relative to that that he was exhibiting over the preceding weeks, and potentially slightly more confused relative  to baseline.  While the patient reportedly has resumed his home Lopressor as a rate control measures in the setting of a history of permanent atrial fibrillation, he has resumed home insulin, of which he reportedly was previously on Lantus 20 units subcu every morning as well as 10 units subcu nightly.  He does not take any short acting insulin at home nor any oral hypoglycemic agents.  Daughter also reports the patient has exhibited diminished oral intake over the last several days.  No recent fall, but the patient has been reporting of some left lower quadrant abdominal pain over the last several days.  Afebrile.  Daughter conveys that the main reason behind presentation to the ED today evaluate for any reversible causes is recent confusion can be addressed via conservative measures alone so as to improve the quality of his remaining life, such as safe resumption of his home insulin regimen. she is consistent in stating that she does not want any aggressive interventions, including procedures, such as coronary angiography.     ED Course:  Vital signs in the ED were notable for the following: Temperature max 98.2; heart rate 64-75; blood pressure 142/102; respiratory rate 18-24; oxygen saturation 97 to 100% on room air.  Labs were notable for the following: CMP notable for the following: Sodium 137, bicarbonate 28, anion gap 11; BUN 24, creatinine 1.07 relative to most recent prior value of 1.06 when checked on 10/22/2019, glucose 442.  CBC notable for white blood cell count of 4800.  Initial troponin I 74, which increased slightly to 179, without prior troponin data point available for point comparison.  Urinalysis showed no white blood cells, no bacteria, leukocyte Estrace  negative and was nitrate negative as well, but showed 100 protein proteinuria greater than 500 glucose, and an elevated specific gravity of 1.025 screening nasopharyngeal COVID-19 PCR performed in the ED today was found to be  negative.  Noncontrast CT that showed no evidence of acute intracranial process.  CT abdomen/pelvis with contrast, which proceed in order to further evaluate the patient's complaint of a left lower quadrant abdominal pain, which evidence of the defect in the left atrium, suspicious for thrombus that was previously suspected at the time of September 2021 hospitalization.  CT abdomen/pelvis also showed evidence of distal colon in the absence of any associated evidence of abscess,, or perforation.  EKG showed atrial fibrillation ventricular rate 69 no evidence of T wave or ST changes.  While in the ED, the following were administered: Normal saline x1 L bolus.  Separately, the patient was admitted for overnight observation to the [place] floor the as well as glycemia in the absence of DKA or HH NK.    Review of Systems: As per HPI otherwise 10 point review of systems negative.   Past Medical History:  Diagnosis Date  . Diabetes mellitus without complication ([place])   . Hypertension   . Seizures ([place])   . Stroke [facility])     Past Surgical History:  Procedure Laterality Date  . none      Social History:  reports that he quit smoking about 22 years ago. His smoking use included cigarettes. He has a 5.00 pack-year smoking history. He has never used smokeless tobacco. He reports that he does not drink alcohol and does not use drugs.   No Known Allergies  Family History  Problem Relation Age of Onset  . Diabetes Mellitus II Father   . Cancer Mother     Prior to Admission medications   Medication Sig Start Date End Date Taking? Authorizing Provider  furosemide (LASIX) 20 MG tablet Take 1 tablet (20 mg total) by mouth daily. 10/22/19  Yes Eugenie Filler, MD  levETIRAcetam (KEPPRA) 1000 MG tablet Take 1 tablet (1,000 mg total) by mouth 2 (two) times daily. Patient taking differently: Take 1,000 mg by mouth 2 (two) times daily as needed. 10/22/19  Yes Eugenie Filler, MD  metoprolol tartrate  (LOPRESSOR) 25 MG tablet Take 1 tablet (25 mg total) by mouth 2 (two) times daily. 10/22/19  Yes Eugenie Filler, MD  insulin glargine (LANTUS) 100 UNIT/ML Solostar Pen Inject 7 Units into the skin daily. Patient taking differently: Inject 10-20 Units into the skin See admin instructions. Take 20 units in the morning, and 10 units at night 10/22/19   Eugenie Filler, MD     Objective    Physical Exam: Vitals:   01/23/20 1350 01/23/20 1430 01/23/20 1445 01/23/20 1500  BP: (!) 142/110 (!) 163/95    Pulse: 65  64 65  Resp: 16 (!) 21 (!) 22 (!) 31  Temp: 98 F (36.7 C)     TempSrc: Oral     SpO2: 99%  98% 96%  Weight:      Height:        General: appears to be stated age; alert but confused. Skin: warm, dry, no rash Head:  AT/[place] Mouth:  Oral mucosa membranes appear dry, normal dentition Neck: supple; trachea midline Heart:  irregular; did not appreciate any M/R/G Lungs: CTAB, did not appreciate any wheezes, rales, or rhonchi Abdomen: + BS; soft, ND, NT Vascular: 2+ pedal pulses b/l; 2+ radial pulses b/l Extremities: no peripheral  edema, no muscle wasting   Labs on Admission: I have personally reviewed following labs and imaging studies  CBC: Recent Labs  Lab 01/23/20 1112  WBC 4.8  NEUTROABS 3.4  HGB 15.3  HCT 47.3  MCV 89.6  PLT 660   Basic Metabolic Panel: Recent Labs  Lab 01/23/20 1112  NA 137  K 5.2*  CL 98  CO2 28  GLUCOSE 442*  BUN 24*  CREATININE 1.07  CALCIUM 10.2   GFR: Estimated Creatinine Clearance: 58 mL/min (by C-G formula based on SCr of 1.07 mg/dL). Liver Function Tests: Recent Labs  Lab 01/23/20 1112  AST 19  ALT 12  ALKPHOS 85  BILITOT 0.9  PROT 7.9  ALBUMIN 3.7   Recent Labs  Lab 01/23/20 1112  LIPASE 36   No results for input(s): AMMONIA in the last 168 hours. Coagulation Profile: No results for input(s): INR, PROTIME in the last 168 hours. Cardiac Enzymes: No results for input(s): CKTOTAL, CKMB, CKMBINDEX, TROPONINI  in the last 168 hours. BNP (last 3 results) No results for input(s): PROBNP in the last 8760 hours. HbA1C: No results for input(s): HGBA1C in the last 72 hours. CBG: No results for input(s): GLUCAP in the last 168 hours. Lipid Profile: No results for input(s): CHOL, HDL, LDLCALC, TRIG, CHOLHDL, LDLDIRECT in the last 72 hours. Thyroid Function Tests: No results for input(s): TSH, T4TOTAL, FREET4, T3FREE, THYROIDAB in the last 72 hours. Anemia Panel: No results for input(s): VITAMINB12, FOLATE, FERRITIN, TIBC, IRON, RETICCTPCT in the last 72 hours. Urine analysis:    Component Value Date/Time   COLORURINE YELLOW (A) 01/23/2020 1112   APPEARANCEUR CLEAR (A) 01/23/2020 1112   LABSPEC 1.025 01/23/2020 1112   PHURINE 7.0 01/23/2020 1112   GLUCOSEU >=500 (A) 01/23/2020 1112   HGBUR NEGATIVE 01/23/2020 1112   BILIRUBINUR NEGATIVE 01/23/2020 1112   KETONESUR NEGATIVE 01/23/2020 1112   PROTEINUR 100 (A) 01/23/2020 1112   NITRITE NEGATIVE 01/23/2020 1112   LEUKOCYTESUR NEGATIVE 01/23/2020 1112    Radiological Exams on Admission: CT Head Wo Contrast  Result Date: 01/23/2020 CLINICAL DATA:  Mental status changes EXAM: CT HEAD WITHOUT CONTRAST TECHNIQUE: Contiguous axial images were obtained from the base of the skull through the vertex without intravenous contrast. 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 There is atrophy and chronic small vessel disease changes. Associated ventriculomegaly. No acute intracranial abnormality. Specifically, no hemorrhage, hydrocephalus, mass lesion, acute infarction, or significant intracranial injury. Vascular: No hyperdense vessel or unexpected calcification. Skull: No acute calvarial abnormality. 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 Atrophy, chronic microvascular disease. No acute intracranial abnormality. Electronically Signed   By: Rolm Baptise M.D.   On: 01/23/2020 14:47   CT  ABDOMEN PELVIS W CONTRAST  Result Date: 01/23/2020 CLINICAL DATA:  Abdominal pain and altered mental status. EXAM: CT ABDOMEN AND PELVIS WITH CONTRAST TECHNIQUE: Multidetector CT imaging of the abdomen and pelvis was performed using the standard protocol following bolus administration of intravenous contrast. CONTRAST:  192m OMNIPAQUE IOHEXOL 300 MG/ML  SOLN COMPARISON:  10/31/2009 FINDINGS: Lower chest: We partially include a 6.9 by 3.6 cm posterior filling defect in the left atrium, suspicious for thrombus, tumor not completely excluded. This could predispose to systemic arterial emboli. Mild to moderate cardiomegaly. Subsegmental atelectasis or scarring in the right middle lobe, lingula, and left lower lobe. In the left posterior costophrenic sulcus, there is a 1.2 by 0.7 by 1.5 cm density with central calcification, based on comparison to  the prior exam I suspect that this is a small diaphragmatic hernia or pleural lipoma that has probably intervally infarcted. There is previously a fat density in this region. Hepatobiliary: Contracted gallbladder. Nonspecific 1.6 by 1.2 cm fluid density lesion along the gallbladder fossa on image 16 of series 2, not well seen on the prior exam, probably a cyst or similar benign lesion but technically nonspecific given the orientation. 0.3 cm nonspecific hypodensity in the right hepatic lobe on image 16 of series 2. No biliary dilatation. Pancreas: Unremarkable Spleen: Calcification along the upper margin of the spleen likely from remote infarct or inflammation. Adrenals/Urinary Tract: Stable bilateral low-density adrenal masses, probably adenomas although technically nonspecific. Renal atrophy and scarring, left greater than right. Hypodense lesions of the kidneys (left greater than right) are likely cysts but many are technically too small to characterize. Stomach/Bowel: Descending and sigmoid colon diverticulosis without active diverticulitis identified. Prominent stool in  the rectal vault, fecal impaction not excluded. No dilated small bowel. Vascular/Lymphatic: Aortoiliac atherosclerotic vascular disease. Stenosis of the right proximal superficial femoral artery. Stenosis of the left proximal common iliac artery. Reproductive: Mild prostatomegaly. Other: No supplemental non-categorized findings. Musculoskeletal: Small lipoma along the right anterior abdominal wall musculature superficial to the lower rib cage, image 24 series 2. Old healed fractures of right transverse processes at L3 and L4. Large superior endplate Schmorl's nodes at T12 and L1. Grade 1 degenerative anterolisthesis at L4-5. Lumbar spondylosis and degenerative disc disease cause right foraminal impingement at L4-5 and left foraminal impingement at L5-S1. IMPRESSION: 1. Partially included 6.9 by 3.6 cm posterior filling defect in the left atrium, suspicious for thrombus, tumor not completely excluded. This could predispose to systemic arterial emboli. Further characterization with trans-esophageal echocardiography (TEE) is recommended. 2. Mild to moderate cardiomegaly. 3. Descending and sigmoid colon diverticulosis. 4. Stable (from 2011) bilateral low-density adrenal masses, probably adenomas although technically nonspecific. 5. Renal atrophy and scarring, left greater than right. Hypodense lesions of the kidneys (left greater than right) are likely cysts but many are technically too small to characterize. 6. Prominent stool in the rectal vault, fecal impaction not excluded. 7. Lumbar spondylosis and degenerative disc disease causing right foraminal impingement at L4-5 and left foraminal impingement at L5-S1. 8. Aortic atherosclerosis. Vascular stenoses of the left proximal common iliac artery and right proximal SFA. Aortic Atherosclerosis (ICD10-I70.0). Critical Value/emergent results were called by telephone at the time of interpretation on 01/23/2020 at 1:17 pm to provider Dr. Arta Silence , who verbally  acknowledged these results. Electronically Signed   By: Van Clines M.D.   On: 01/23/2020 13:17     EKG: Independently reviewed, with result as described above.    Assessment/Plan   Lee Gonzalez is a 69 y.o. male with medical history significant for acute ischemic CVA in September 2021, seizure disorder, chronic combined systolic/diastolic heart failure, with most recent echocardiogram in September 2021 showing LVEF 30 to 35% with grade 2 diastolic dysfunction, dementia, type 2 diabetes mellitus, hypertension, who is admitted to [facility] on 01/23/2020 with hyperglycemia in the absence of DKA or HH NK after presenting from home on home hospice to [facility] Emergency Department for evaluation of altered mental status.    Principal Problem:   Hyperglycemia Active Problems:   Elevated troponin   Permanent atrial fibrillation (HCC)   Thrombus of left atrial appendage   Diabetes mellitus without complication (HCC)   Seizure (HCC)   Acute metabolic encephalopathy   Abdominal pain    #)  Acute metabolic encephalopathy: Superimposed on baseline dementia, patient presents with confusion and somnolence baseline over the last 4 to 5 days.  Suspect metabolic contributions, including the appearance of dehydration in the setting of report of diminished oral intake over the last several days, as evidenced by elevated specific gravity and appearance of dry oral mucous membranes.  Additionally, there appears to be a contribution from hyperglycemia, with presenting blood sugar noted to be 440 presentation, in the absence of any associated EKG or EKG state.  As discussed with the patient's daughter is the patient being on hospice, is amenable to simple conservative corrective measures that may improve his remaining quality of life, such as his glycemic management.  No evidence of underlying infectious process at this time, including urinalysis which was suggestive of UTI, and  COVID-19/influenza PCR found to be negative this evening.  We will plan on pursuing chest x-ray, as this a reversible source of the patient's confusion, should the films reveal underlying pneumonia.  Acute ischemic CVA felt to be less likely.  While the patient's daughter would like to see some improvement patient's mental status, she does not want this to, the expense of pain control, and is amenable to IV opioids with pain control.   Plan: Work-up and management of hyperglycemia, including presumption of a component of her basal insulin, as further described below.  Recommend management of dehydration, including very gentle IV fluids in the context of a history of chronic combined heart failure, as further described below.  Chest x-ray, as above.       #) Hyperglycemia: Presenting glucose noted to be 442 in the context of documented history of type 2 diabetes mellitus for which the patient completely stopped his home insulin 3 months ago when he transitioned to comfort care at that time, without subsequent resumption transition back to hospice more recently.  While outpatient med rack notes that the patient is prescribed Lantus 7 units subcu daily, patient's family reports that Lantus regimen consisted of 20 units in the morning as well as 20 units in the evening.  Not on any short acting insulin and not on any oral hypoglycemic agents.  No evidence of metabolic acidosis to suggest associated DKA, and clinical and laboratory findings are not consistent with HH NK.  As described above, resume a component of 2 in the hopes that this may improve some of the patient's confusion and somnolence.  That being said, be conservative in the resumption of the basal insulin so as to not induce hypoglycemia, particularly given that the patient has been completely off of exogenous insulin for 3 months.   Plan: Lantus 5 units subcu. gentle normal saline running at 75 cc/h.  Dizziness.  Accu-Cheks before every meal and  at bedtime was low-dose sliding scale insulin.      #) Dehydration: Recent decline in oral intake, he presenting labs reveal a evidence of prerenal azotemia in the absence of associated AKI, while urinalysis shows elevated specific gravity, physical exam shows dry oral mucosa membranes.  Not associated with any hypotension.  Resuscitative efforts with caution in the context of the patient's history of chronic combined heart failure, with LVEF of 30 to 35% noted per most recent echo in September 2021.  Plan: Gentle IV fluids, as above.  Monitor daily weights.  We will repeat BMP in the morning, wit monitoring in the function.     #) Elevated troponin: Presenting troponin noted to be 174, with repeat value trending up slightly to 179.  No prior troponin high-sensitivity troponin I values available for point comparison.  EKG shows known permanent atrial fibrillation with ventricular rate of 69, and no evidence of acute ischemic changes.  Patient denies any chest pain.  Presenting elevation stems from contribution from evidence of filling defect noted in the left atrium, with suspicion for left atrial appendage thrombus.  This was also suspected on most recent prior echo in September.  Patient's daughter confirms the family prevention for such, including TEE to further evaluate, and would also prefer to not engage in blood thinners as the patient appears asymptomatic from this potential thrombus.  Family would also not want CT surgery should subsequent indication arise.  Consequently, as treatment would not change even if the patient were to develop a STEMI at this time, trending troponin values and also stop monitoring on telemetry.  Plan: Troponin lab checks, as above.  Will also discontinue telemetry at this time.      #) Left lower quadrant abdominal pain: Indicates left lower quadrant pain, which frequently feels may be new for him over the last 4 to 5 days.  No recent trauma.  CT abdomen/pelvis  shows evidence of prominent stool in the descending colon, suggesting of potential contribution to his abdominal discomfort from constipation.  In the absence of any CT evidence to suggest bowel obstruction, perforation, or abscess.  Plan: We will start a scheduled stool softener.  As needed ED, will continue this for now, although may be able to de-escalate so as to induce less of a associated constipation exacerbation in the setting.      #) Seizure disorder: Outpatient medication list notes scheduled daily Keppra, however family reports that they only get this to the patient on a as needed basis, and would prefer to continue to administer in this fashion.  No evidence of seizure-like activity at this time.  Plan: Continue home as needed Keppra per the patient's family's request.     #) Chronic combined systolic/diastolic heart failure: With most recent echocardiogram in September 2021 showing LVEF 30 to 35%, global hypokinesis of the left ventricle, grade 2 diastolic function, and suggestion of mass in the left atrial appendage potentially representing thrombus.  His outpatient heart failure regimen includes Lasix 20 mg daily as well as Lopressor he is not on an ACE inhibitor or an ARB.  No evidence to suggest acute volume overload at this time, rather the patient physical exam and clinical evidence of dehydration, as further described above  Plan: Hold home Lasix dose for today, while providing gentle IV fluids, with plan to reevaluate fluid status tomorrow morning to aid in determination of timing of resumption of Lasix.  Repeat BMP in the morning.      #) Permanent atrial fibrillation: in the setting of CHA2DS2-VASc score of 5 criteria are met for chronic anticoagulation for the purpose of thromboembolic prophylaxis.  However, the family that the patient remain off of blood thinners due to their concern regarding bleeding risk, and the patient's remaining quality of life.  Presenting EKG  shows rate controlled atrial ablation changes.  His outpatient AV nodal blocking regimen consists of Lopressor.  Plan: Resume home Lopressor, as above.  Per family's request, will continue to refrain from formal anticoagulation, as above.    DVT prophylaxis: SCDs Code Status: DNR Family Communication: Case discussed with patient's daughter Disposition Plan: Per Rounding Team Consults called: none  Admission status: Observation; MedSurg.  Of note, this patient was added by me to the following Admit List/Treatment  Team:  armcadmits     PLEASE NOTE THAT DRAGON DICTATION SOFTWARE WAS USED IN THE CONSTRUCTION OF THIS NOTE.   [facility] Pager [phone] From 12PM- 12AM  Otherwise, please contact night-coverage  [email or web address] Password [email or web address]  01/23/2020, 3:15 PM

## 2020-01-23 NOTE — ED Notes (Signed)
Male external catheter placed to collect urine sample.

## 2020-01-23 NOTE — ED Triage Notes (Signed)
Patient reported via EMS for altered mental status increasing for 2 days per recommendation of hospice nurse. Patient post stroke in September. DNR recently revoked due to improved status.

## 2020-01-23 NOTE — TOC Progression Note (Signed)
Transition of Care [facility]) - Progression Note    Patient Details  Name: Lee Gonzalez MRN: [id number] Date of Birth: 1950/10/14  [facility] CM/SW Contact  Shawn Route, RN Phone Number: 01/23/2020, 1:57 PM  Clinical Narrative:     Spoke with granddaughter and Wife.  Patient has been living at his daughters house under hospice care.  DNR recently revoked since patient has improved.  Family is interested in rehab or long term care at this time when patient is medically stable.   Expected Discharge Plan: Long Term Nursing Home Barriers to Discharge: Continued Medical Work up  Expected Discharge Plan and Services Expected Discharge Plan: Long Term Nursing Home     Post Acute Care Choice: Nursing Home                                         Social Determinants of Health (SDOH) Interventions    Readmission Risk Interventions No flowsheet data found.

## 2020-01-23 NOTE — ED Notes (Signed)
Date and time results received: 01/23/20 1150  Test: lactic Critical Value: 2.2  Name of Provider Notified: Siadecki

## 2020-01-23 NOTE — ED Provider Notes (Signed)
[facility] Emergency Department Provider Note ____________________________________________   Event Date/Time   First MD Initiated Contact with Patient 01/23/20 1043     (approximate)  I have reviewed the triage vital signs and the nursing notes.   HISTORY  Chief Complaint Altered Mental Status  Level 5 caveat: History of present illness limited due to altered mental status  HPI Lee Gonzalez is a 69 y.o. male with PMH as noted below, currently in home hospice care who presents with altered mental status and abdominal pain.  Per the daughter, the patient was admitted to the hospital few months ago for stroke.  After that time he was taken off almost all of his medications and transition to comfort care.  However, in the interim his clinical status had improved and although he is still on hospice he was started back on some of his medications.  He is not on any insulin.  Over the last week he has become weaker, less talkative, and has developed a "knot" on his left lower abdomen which is painful.   Past Medical History:  Diagnosis Date  . Diabetes mellitus without complication (HCC)   . Hypertension   . Seizures (HCC)   . Stroke [facility])     Patient Active Problem List   Diagnosis Date Noted  . Abnormal echocardiogram   . CVA (cerebral vascular accident) (HCC): Subacute 10/18/2019  . Goals of care, counseling/discussion   . Palliative care by specialist   . DNR (do not resuscitate)   . AKI (acute kidney injury) (HCC)   . Dehydration   . Acute metabolic encephalopathy   . Fibrinolysis (HCC)   . Insulin-requiring or dependent type II diabetes mellitus (HCC)   . Failure to thrive in adult   . Hypophosphatemia   . AF (paroxysmal atrial fibrillation) (HCC)   . COVID-19 virus infection   . Seizure (HCC) 10/16/2019  . Pain due to onychomycosis of toenails of both feet 11/08/2018  . Diabetes mellitus without complication (HCC) 11/08/2018  .  Coagulation disorder (HCC) 11/08/2018  . Permanent atrial fibrillation (HCC) 07/31/2018  . Thrombus of left atrial appendage 07/31/2018  . Cocaine abuse (HCC) 12/19/2017  . 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 Elevated troponin 12/19/2017  . Head pain, chronic 06/08/2016  . Altered mental status 06/14/2015  . Sepsis (HCC) 06/14/2015  . Convulsions (HCC) 05/02/2014  . Head and face pain 10/28/2013  . History of CVA (cerebrovascular accident) 10/28/2013  . Syncope 10/28/2013    Past Surgical History:  Procedure Laterality Date  . none      Prior to Admission medications   Medication Sig Start Date End Date Taking? Authorizing Provider  furosemide (LASIX) 20 MG tablet Take 1 tablet (20 mg total) by mouth daily. 10/22/19  Yes Rodolph Bong, MD  levETIRAcetam (KEPPRA) 1000 MG tablet Take 1 tablet (1,000 mg total) by mouth 2 (two) times daily. Patient taking differently: Take 1,000 mg by mouth 2 (two) times daily as needed. 10/22/19  Yes Rodolph Bong, MD  metoprolol tartrate (LOPRESSOR) 25 MG tablet Take 1 tablet (25 mg total) by mouth 2 (two) times daily. 10/22/19  Yes Rodolph Bong, MD  insulin glargine (LANTUS) 100 UNIT/ML Solostar Pen Inject 7 Units into the skin daily. Patient taking differently: Inject 10-20 Units into the skin See admin instructions. Take 20 units in the morning, and 10 units at night 10/22/19   Rodolph Bong, MD  Allergies Patient has no known allergies.  Family History  Problem Relation Age of Onset  . Diabetes Mellitus II Father   . Cancer Mother     Social History Social History   Tobacco Use  . Smoking status: Former Smoker    Packs/day: 0.50    Years: 10.00    Pack years: 5.00    Types: Cigarettes    Quit date: 07/12/1997    Years since quitting: 22.5  . Smokeless tobacco: Never Used  Substance Use Topics  . Alcohol use: No    Alcohol/week: 0.0 standard drinks  . Drug use: No     Review of Systems Level 5 caveat: Unable to obtain review of systems due to altered mental status    ____________________________________________   PHYSICAL EXAM:  VITAL SIGNS: ED Triage Vitals  Enc Vitals Group     BP 01/23/20 1100 (!) 178/98     Pulse Rate 01/23/20 1100 68     Resp 01/23/20 1100 18     Temp 01/23/20 1100 98.7 F (37.1 C)     Temp Source 01/23/20 1100 Oral     SpO2 01/23/20 1100 99 %     Weight 01/23/20 1102 147 lb 11.3 oz (67 kg)     Height 01/23/20 1102 5\' 6"  (1.676 m)     Head Circumference --      Peak Flow --      Pain Score --      Pain Loc --      Pain Edu? --      Excl. in GC? --     Constitutional: Alert, confused.  Chronically ill and weak appearing but in no acute distress. Eyes: Conjunctivae are normal.  EOMI.  PERRLA. Head: Atraumatic. Nose: No congestion/rhinnorhea. Mouth/Throat: Mucous membranes are dry.   Neck: Normal range of motion.  Cardiovascular: Normal rate, regular rhythm. Grossly normal heart sounds.  Good peripheral circulation. Respiratory: Normal respiratory effort.  No retractions. Lungs CTAB. Gastrointestinal: Soft with moderate tenderness to the left lower quadrant.  No distention.  No palpable masses. Genitourinary: No flank tenderness. Musculoskeletal: No lower extremity edema.  Extremities warm and well perfused.  Neurologic:  Normal speech and language.  No acute neurologic deficits. Skin:  Skin is warm and dry. No rash noted. Psychiatric: Calm and cooperative.  ____________________________________________   LABS (all labs ordered are listed, but only abnormal results are displayed)  Labs Reviewed  COMPREHENSIVE METABOLIC PANEL - Abnormal; Notable for the following components:      Result Value   Potassium 5.2 (*)    Glucose, Bld 442 (*)    BUN 24 (*)    All other components within normal limits  LACTIC ACID, PLASMA - Abnormal; Notable for the following components:   Lactic Acid, Venous 2.1 (*)    All  other components within normal limits  URINALYSIS, COMPLETE (UACMP) WITH MICROSCOPIC - Abnormal; Notable for the following components:   Color, Urine YELLOW (*)    APPearance CLEAR (*)    Glucose, UA >=500 (*)    Protein, ur 100 (*)    All other components within normal limits  BLOOD GAS, VENOUS - Abnormal; Notable for the following components:   pO2, Ven <31.0 (*)    Bicarbonate 32.5 (*)    Acid-Base Excess 5.5 (*)    All other components within normal limits  TROPONIN I (HIGH SENSITIVITY) - Abnormal; Notable for the following components:   Troponin I (High Sensitivity) 174 (*)    All other components  within normal limits  TROPONIN I (HIGH SENSITIVITY) - Abnormal; Notable for the following components:   Troponin I (High Sensitivity) 179 (*)    All other components within normal limits  RESP PANEL BY RT-PCR (FLU A&B, COVID) ARPGX2  LIPASE, BLOOD  CBC WITH DIFFERENTIAL/PLATELET  LACTIC ACID, PLASMA   ____________________________________________  EKG  ED ECG REPORT I, Dionne Bucy, the attending physician, personally viewed and interpreted this ECG.  Date: 01/23/2020 EKG Time: 1056 Rate: 73 Rhythm: Atrial fibrillation and QRS Axis: normal Intervals: Nonspecific IVCD ST/T Wave abnormalities: Nonspecific abnormalities Narrative Interpretation: no evidence of acute ischemia  ____________________________________________  RADIOLOGY  CT abdomen: Atrial thrombus  ____________________________________________   PROCEDURES  Procedure(s) performed: No  Procedures  Critical Care performed: No ____________________________________________   INITIAL IMPRESSION / ASSESSMENT AND PLAN / ED COURSE  Pertinent labs & imaging results that were available during my care of the patient were reviewed by me and considered in my medical decision making (see chart for details).  69 year old male with PMH as noted above presents with increased weakness, altered mental status, and  abdominal pain over the last week.  I reviewed the past medical records in Epic.  The patient was admitted in September with metabolic encephalopathy and a subacute CVA.  He also has CHF.  He was evaluated by palliative care and transitioned to comfort measures.  He was taken off of most of his medications at that time.  Per the daughter, the patient subsequently improved.  He is still on home hospice, but has been started back on many of his medications and his DNR has been rescinded.  On exam today, the patient is chronically ill and weak appearing.  His vital signs are normal except for hypertension.  He has chronic weakness due to a stroke but no acute neurologic deficits.  He is alert but with slurred speech and only really able to say his name.  He appears confused.  The abdomen is soft with some tenderness to the left lower quadrant.  There is no palpable mass, but possibly a little bit of a bulge in this area which the daughter has identified as being the source of the pain.  Differential is broad but includes hyperglycemia/DKA, dehydration, electrolyte abnormality, other metabolic disturbance, UTI or other infection, diverticulitis, colitis, or other intra-abdominal etiology, or less likely primary cardiac or CNS cause.  Based on discussion with the daughter about the goals of care we will obtain a CT of the abdomen, lab work-up, and reassess.  ----------------------------------------- 3:26 PM on 01/23/2020 -----------------------------------------  CT does not show any acute significant intra-abdominal findings.  There is an atrial thrombus although this may be subacute.  Lab work-up reveals hyperglycemia, mild hyperkalemia, and an elevated troponin.  I obtained a CT of the head to rule out acute stroke, and this is negative.  I had an extensive discussion with the daughter about the overall goals of care given his hospice status.  She feels the patient would not want any invasive  procedures, so at this time we have decided against acute cardiac intervention or TEE.  However, given the acute change as well as the hyperglycemia and elevated troponin, the daughter would prefer that we admit the patient at least for observation, IV fluids, and to make sure that he is stable with no further acute deterioration.  Clydie Braun the hospice provider also came to speak to the daughter and coordinate with me.  I discussed the case with Dr. Dalene Carrow from the hospitalist service for  admission. ____________________________________________   FINAL CLINICAL IMPRESSION(S) / ED DIAGNOSES  Final diagnoses:  Altered mental status, unspecified altered mental status type  Hyperglycemia  Elevated troponin      NEW MEDICATIONS STARTED DURING THIS VISIT:  New Prescriptions   No medications on file     Note:  This document was prepared using Dragon voice recognition software and may include unintentional dictation errors.    Dionne Bucy, MD 01/23/20 1535

## 2020-01-23 NOTE — ED Notes (Signed)
Patient denies pain and is resting comfortably.  

## 2020-01-23 NOTE — ED Notes (Signed)
Date and time results received: 01/23/20 1214 (use smartphrase ".now" to insert current time)  Test: troponin Critical Value: 174  Name of Provider Notified: [email or web address]

## 2020-01-24 DIAGNOSIS — I5022 Chronic systolic (congestive) heart failure: Secondary | ICD-10-CM

## 2020-01-24 DIAGNOSIS — R569 Unspecified convulsions: Secondary | ICD-10-CM

## 2020-01-24 DIAGNOSIS — G9341 Metabolic encephalopathy: Secondary | ICD-10-CM | POA: Diagnosis not present

## 2020-01-24 DIAGNOSIS — I5189 Other ill-defined heart diseases: Secondary | ICD-10-CM

## 2020-01-24 DIAGNOSIS — E1165 Type 2 diabetes mellitus with hyperglycemia: Secondary | ICD-10-CM | POA: Diagnosis not present

## 2020-01-24 DIAGNOSIS — I4821 Permanent atrial fibrillation: Secondary | ICD-10-CM | POA: Diagnosis not present

## 2020-01-24 DIAGNOSIS — Z794 Long term (current) use of insulin: Secondary | ICD-10-CM

## 2020-01-24 LAB — GLUCOSE, CAPILLARY
Glucose-Capillary: 106 mg/dL — ABNORMAL HIGH (ref 70–99)
Glucose-Capillary: 344 mg/dL — ABNORMAL HIGH (ref 70–99)

## 2020-01-24 LAB — CBC
HCT: 47.7 % (ref 39.0–52.0)
Hemoglobin: 15.3 g/dL (ref 13.0–17.0)
MCH: 29.1 pg (ref 26.0–34.0)
MCHC: 32.1 g/dL (ref 30.0–36.0)
MCV: 90.7 fL (ref 80.0–100.0)
Platelets: 168 10*3/uL (ref 150–400)
RBC: 5.26 MIL/uL (ref 4.22–5.81)
RDW: 14.4 % (ref 11.5–15.5)
WBC: 5.9 10*3/uL (ref 4.0–10.5)
nRBC: 0 % (ref 0.0–0.2)

## 2020-01-24 LAB — BASIC METABOLIC PANEL
Anion gap: 14 (ref 5–15)
BUN: 25 mg/dL — ABNORMAL HIGH (ref 8–23)
CO2: 23 mmol/L (ref 22–32)
Calcium: 9.9 mg/dL (ref 8.9–10.3)
Chloride: 103 mmol/L (ref 98–111)
Creatinine, Ser: 1.15 mg/dL (ref 0.61–1.24)
GFR, Estimated: >60 mL/min (ref >60)
Glucose, Bld: 138 mg/dL — ABNORMAL HIGH (ref 70–99)
Potassium: 5 mmol/L (ref 3.5–5.1)
Sodium: 140 mmol/L (ref 135–145)

## 2020-01-24 LAB — HEMOGLOBIN A1C
Hgb A1c MFr Bld: 10.7 % — ABNORMAL HIGH (ref 4.8–5.6)
Mean Plasma Glucose: 260.39 mg/dL

## 2020-01-24 MED ORDER — DOCUSATE SODIUM 100 MG PO CAPS: 100.0000 mg | ORAL_CAPSULE | Freq: Two times a day (BID) | ORAL | 0 refills | Status: AC

## 2020-01-24 MED ORDER — INSULIN NPH (HUMAN) (ISOPHANE) 100 UNIT/ML ~~LOC~~ SUSP: 5.0000 [IU] | Freq: Two times a day (BID) | SUBCUTANEOUS | 0 refills | Status: AC

## 2020-01-24 MED ORDER — INSULIN SYRINGE 31G X 5/16" 0.3 ML MISC
1.0000 | Freq: Two times a day (BID) | 0 refills | Status: AC
Start: 2020-01-24 — End: ?

## 2020-01-24 MED ORDER — LEVETIRACETAM 500 MG PO TABS: 1000.0000 mg | ORAL_TABLET | Freq: Two times a day (BID) | ORAL | Status: DC

## 2020-01-24 MED ORDER — LEVETIRACETAM 1000 MG PO TABS: 1000.0000 mg | ORAL_TABLET | Freq: Two times a day (BID) | ORAL | 1 refills | Status: AC

## 2020-01-24 MED ORDER — INSULIN PEN NEEDLE 32G X 4 MM MISC: 1.0000 | Freq: Two times a day (BID) | 0 refills | Status: AC

## 2020-01-24 MED ORDER — INSULIN GLARGINE 100 UNIT/ML SOLOSTAR PEN: 5.0000 [IU] | PEN_INJECTOR | Freq: Two times a day (BID) | SUBCUTANEOUS | 0 refills | Status: DC

## 2020-01-24 MED ORDER — SODIUM CHLORIDE 0.9 % IV SOLN: INTRAVENOUS | Status: DC

## 2020-01-24 MED ORDER — LEVETIRACETAM 1000 MG PO TABS: 1000.0000 mg | ORAL_TABLET | Freq: Two times a day (BID) | ORAL | 1 refills | Status: DC

## 2020-01-24 NOTE — Progress Notes (Addendum)
[facility] Liaison Note:  Patient seen being fed lunch by hospital aide. He was alert and did attempt to answer simple questions, but was focused more on chewing. Plan is for discharge home today via [facility] at 2 pm. Writer has spoken to patient's daughter Olivia Mackie who is aware and in agreement with discharge plan. Discussed insulin orders with attending physician Dr. Renae Gloss, Lantus to be changed to Hospice formulary insulin, Olivia Mackie aware. Patient is also now a DNR and has a signed out of a facility DNR in place for transport. Hospice team updated to discharge. Dayna Barker BSN, RN, [facility] [employer]  [phone]

## 2020-01-24 NOTE — TOC Progression Note (Signed)
Transition of Care [facility]) - Progression Note    Patient Details  Name: Chino Sardo MRN: [id number] Date of Birth: 03-02-1950  [facility] CM/SW Contact  Margarito Liner, LCSW Phone Number: 01/24/2020, 9:31 AM  Clinical Narrative: CSW was updated by [facility] liaison, Dayna Barker, RN regarding patient's case. She spoke with daughter late yesterday who said she confirmed with daughter plan to return home with hospice at discharge. CSW called wife and confirmed the same. Patient will need EMS transport home when discharged. Address on facesheet is correct.    Expected Discharge Plan: Home w Hospice Care Barriers to Discharge: Continued Medical Work up  Expected Discharge Plan and Services Expected Discharge Plan: Home w Hospice Care        Post Acute Care Choice: Home with Hospice Care  Social Determinants of Health (SDOH) Interventions    Readmission Risk Interventions No flowsheet data found.

## 2020-01-24 NOTE — Discharge Summary (Addendum)
[facility] [place] [facility]   PATIENT NAME: Lee Gonzalez    MR#:  [id number]  DATE OF BIRTH:  Aug 13, 1950  DATE OF ADMISSION:  01/23/2020 ADMITTING PHYSICIAN: Angie Fava, DO  DATE OF DISCHARGE: 01/24/2020  PRIMARY CARE PHYSICIAN: Marletta Lor, NP    ADMISSION DIAGNOSIS:  Hyperglycemia [R73.9] Elevated troponin [R77.8] Altered mental status, unspecified altered mental status type [R41.82] AMS (altered mental status) [R41.82]  DISCHARGE DIAGNOSIS:  Principal Problem:   Hyperglycemia Active Problems:   Elevated troponin   Permanent atrial fibrillation (HCC)   Thrombus of left atrial appendage   Diabetes mellitus without complication (HCC)   Seizure (HCC)   Acute metabolic encephalopathy   Type 2 diabetes mellitus with hyperglycemia, with long-term current use of insulin (HCC)   Abdominal pain   Mass of heart   Chronic systolic CHF (congestive heart failure) (HCC)   SECONDARY DIAGNOSIS:   Past Medical History:  Diagnosis Date  . Diabetes mellitus without complication (HCC)   . Hypertension   . Seizures (HCC)   . Stroke [facility])     HOSPITAL COURSE:   1.  Type 2 diabetes mellitus with hyperglycemia.  Hemoglobin A1c elevated at 8.5.  Started on NPH 5 units subcutaneous injection twice a day. 2.  Left atrium mass which could be thrombus versus myxoma.  No further work-up since under hospice care 3.  Elevated troponin.  Likely demand ischemia.  Troponins flat.  No further work-up. 4.  Underlying chronic kidney disease stage II.  Dehydration given some IV fluids.  Patient eating this morning. 5.  Seizure disorder.  As needed Keppra as per family. 6.  Chronic systolic congestive heart failure.  Under hospice care on Lasix at home and metoprolol. 7.  Chronic atrial fibrillation 8.  Acute metabolic encephalopathy on presentation.  Patient eating at this point.  Family feels comfortable taking him home.   DISCHARGE CONDITIONS:    Fair  CONSULTS OBTAINED:  Hospice  DRUG ALLERGIES:  No Known Allergies  DISCHARGE MEDICATIONS:   Allergies as of 01/24/2020   No Known Allergies     Medication List    STOP taking these medications   insulin glargine 100 UNIT/ML Solostar Pen Commonly known as: LANTUS     TAKE these medications   docusate sodium 100 MG capsule Commonly known as: COLACE Take 1 capsule (100 mg total) by mouth 2 (two) times daily.   furosemide 20 MG tablet Commonly known as: LASIX Take 1 tablet (20 mg total) by mouth daily.   insulin NPH Human 100 UNIT/ML injection Commonly known as: NovoLIN N Inject 0.05 mLs (5 Units total) into the skin 2 (two) times daily before a meal.   Insulin Pen Needle 32G X 4 MM Misc 1 Dose by Does not apply route 2 (two) times daily.   INSULIN SYRINGE .3CC/31GX5/16" 31G X 5/16" 0.3 ML Misc 1 Dose by Does not apply route in the morning and at bedtime.   levETIRAcetam 1000 MG tablet Commonly known as: Keppra Take 1 tablet (1,000 mg total) by mouth 2 (two) times daily. What changed:   when to take this  reasons to take this   metoprolol tartrate 25 MG tablet Commonly known as: LOPRESSOR Take 1 tablet (25 mg total) by mouth 2 (two) times daily.        DISCHARGE INSTRUCTIONS:   Follow-up PMD 5 days Follow-up hospice care  If you experience worsening of your admission symptoms, develop shortness of breath, life threatening emergency, suicidal or  homicidal thoughts you must seek medical attention immediately by calling 911 or calling your MD immediately  if symptoms less severe.  You Must read complete instructions/literature along with all the possible adverse reactions/side effects for all the Medicines you take and that have been prescribed to you. Take any new Medicines after you have completely understood and accept all the possible adverse reactions/side effects.   Please note  You were cared for by a hospitalist during your hospital stay.  If you have any questions about your discharge medications or the care you received while you were in the hospital after you are discharged, you can call the unit and asked to speak with the hospitalist on call if the hospitalist that took care of you is not available. Once you are discharged, your primary care physician will handle any further medical issues. Please note that NO REFILLS for any discharge medications will be authorized once you are discharged, as it is imperative that you return to your primary care physician (or establish a relationship with a primary care physician if you do not have one) for your aftercare needs so that they can reassess your need for medications and monitor your lab values.    Today   CHIEF COMPLAINT:   Chief Complaint  Patient presents with  . Altered Mental Status    HISTORY OF PRESENT ILLNESS:  Lee Gonzalez  is a 69 y.o. male came in with altered mental status   VITAL SIGNS:  Blood pressure (!) 170/99, pulse 82, temperature 97.8 F (36.6 C), temperature source Oral, resp. rate 18, height 5\' 6"  (1.676 m), weight 62.5 kg, SpO2 100 %.  I/O:    Intake/Output Summary (Last 24 hours) at 01/24/2020 1132 Last data filed at 01/24/2020 1035 Gross per 24 hour  Intake 120 ml  Output 150 ml  Net -30 ml    PHYSICAL EXAMINATION:  GENERAL:  69 y.o.-year-old patient lying in the bed with no acute distress.  EYES: Pupils equal, round, reactive to light and accommodation. No scleral icterus.  HEENT: Head atraumatic, normocephalic. Oropharynx and nasopharynx clear.  LUNGS: Normal breath sounds bilaterally, no wheezing, rales,rhonchi or crepitation. No use of accessory muscles of respiration.  CARDIOVASCULAR: S1, S2 irregular regular. No murmurs, rubs, or gallops.  ABDOMEN: Soft, non-tender, non-distended. Bowel sounds present. EXTREMITIES: No pedal edema.   PSYCHIATRIC: The patient is alert and is eating.   DATA REVIEW:   CBC Recent Labs  Lab  01/24/20 0730  WBC 5.9  HGB 15.3  HCT 47.7  PLT 168    Chemistries  Recent Labs  Lab 01/23/20 1112 01/24/20 0730  NA 137 140  K 5.2* 5.0  CL 98 103  CO2 28 23  GLUCOSE 442* 138*  BUN 24* 25*  CREATININE 1.07 1.15  CALCIUM 10.2 9.9  AST 19  --   ALT 12  --   ALKPHOS 85  --   BILITOT 0.9  --      Microbiology Results  Results for orders placed or performed during the hospital encounter of 01/23/20  Resp Panel by RT-PCR (Flu A&B, Covid) Nasopharyngeal Swab     Status: None   Collection Time: 01/23/20  4:03 PM   Specimen: Nasopharyngeal Swab; Nasopharyngeal(NP) swabs in vial transport medium  Result Value Ref Range Status   SARS Coronavirus 2 by RT PCR NEGATIVE NEGATIVE Final    Comment: (NOTE) SARS-CoV-2 target nucleic acids are NOT DETECTED.  The SARS-CoV-2 RNA is generally detectable in upper respiratory specimens during the  acute phase of infection. The lowest concentration of SARS-CoV-2 viral copies this assay can detect is 138 copies/mL. A negative result does not preclude SARS-Cov-2 infection and should not be used as the sole basis for treatment or other patient management decisions. A negative result may occur with  improper specimen collection/handling, submission of specimen other than nasopharyngeal swab, presence of viral mutation(s) within the areas targeted by this assay, and inadequate number of viral copies(<138 copies/mL). A negative result must be combined with clinical observations, patient history, and epidemiological information. The expected result is Negative.  Fact Sheet for Patients:  BloggerCourse.comhttps://www.fda.gov/media/152166/download  Fact Sheet for Healthcare Providers:  SeriousBroker.ithttps://www.fda.gov/media/152162/download  This test is no t yet approved or cleared by the [place] FDA and  has been authorized for detection and/or diagnosis of SARS-CoV-2 by FDA under an Emergency Use Authorization (EUA). This EUA will remain  in effect (meaning this  test can be used) for the duration of the COVID-19 declaration under Section 564(b)(1) of the Act, 21 U.S.C.section 360bbb-3(b)(1), unless the authorization is terminated  or revoked sooner.       Influenza A by PCR NEGATIVE NEGATIVE Final   Influenza B by PCR NEGATIVE NEGATIVE Final    Comment: (NOTE) The Xpert Xpress SARS-CoV-2/FLU/RSV plus assay is intended as an aid in the diagnosis of influenza from Nasopharyngeal swab specimens and should not be used as a sole basis for treatment. Nasal washings and aspirates are unacceptable for Xpert Xpress SARS-CoV-2/FLU/RSV testing.  Fact Sheet for Patients: BloggerCourse.comhttps://www.fda.gov/media/152166/download  Fact Sheet for Healthcare Providers: SeriousBroker.ithttps://www.fda.gov/media/152162/download  This test is not yet approved or cleared by the [place] FDA and has been authorized for detection and/or diagnosis of SARS-CoV-2 by FDA under an Emergency Use Authorization (EUA). This EUA will remain in effect (meaning this test can be used) for the duration of the COVID-19 declaration under Section 564(b)(1) of the Act, 21 U.S.C. section 360bbb-3(b)(1), unless the authorization is terminated or revoked.  Performed at [facility], [address] [place], [place] [address]     RADIOLOGY:  DG Chest 1 View  Result Date: 01/23/2020 CLINICAL DATA:  Altered mental status EXAM: CHEST  1 VIEW COMPARISON:  10/16/2019 and prior FINDINGS: No pneumothorax or pleural effusion. Cardiomegaly with central pulmonary vascular congestion, unchanged. No new focal consolidation. No acute osseous abnormality. IMPRESSION: Cardiomegaly with central pulmonary vascular congestion, unchanged. No new focal consolidation. Electronically Signed   By: Stana Buntinghikanele  Emekauwa M.D.   On: 01/23/2020 19:04   CT Head Wo Contrast  Result Date: 01/23/2020 CLINICAL DATA:  Mental status changes EXAM: CT HEAD WITHOUT CONTRAST TECHNIQUE: Contiguous axial images were obtained from the  base of the skull through the vertex without intravenous contrast. 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 There is atrophy and chronic small vessel disease changes. Associated ventriculomegaly. No acute intracranial abnormality. Specifically, no hemorrhage, hydrocephalus, mass lesion, acute infarction, or significant intracranial injury. Vascular: No hyperdense vessel or unexpected calcification. Skull: No acute calvarial abnormality. 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 Atrophy, chronic microvascular disease. No acute intracranial abnormality. Electronically Signed   By: Charlett NoseKevin  Dover M.D.   On: 01/23/2020 14:47   CT ABDOMEN PELVIS W CONTRAST  Result Date: 01/23/2020 CLINICAL DATA:  Abdominal pain and altered mental status. EXAM: CT ABDOMEN AND PELVIS WITH CONTRAST TECHNIQUE: Multidetector CT imaging of the abdomen and pelvis was performed using the standard protocol following bolus administration of intravenous contrast. CONTRAST:  100mL OMNIPAQUE IOHEXOL 300  MG/ML  SOLN COMPARISON:  10/31/2009 FINDINGS: Lower chest: We partially include a 6.9 by 3.6 cm posterior filling defect in the left atrium, suspicious for thrombus, tumor not completely excluded. This could predispose to systemic arterial emboli. Mild to moderate cardiomegaly. Subsegmental atelectasis or scarring in the right middle lobe, lingula, and left lower lobe. In the left posterior costophrenic sulcus, there is a 1.2 by 0.7 by 1.5 cm density with central calcification, based on comparison to the prior exam I suspect that this is a small diaphragmatic hernia or pleural lipoma that has probably intervally infarcted. There is previously a fat density in this region. Hepatobiliary: Contracted gallbladder. Nonspecific 1.6 by 1.2 cm fluid density lesion along the gallbladder fossa on image 16 of series 2, not well seen on the prior exam, probably a cyst or similar benign  lesion but technically nonspecific given the orientation. 0.3 cm nonspecific hypodensity in the right hepatic lobe on image 16 of series 2. No biliary dilatation. Pancreas: Unremarkable Spleen: Calcification along the upper margin of the spleen likely from remote infarct or inflammation. Adrenals/Urinary Tract: Stable bilateral low-density adrenal masses, probably adenomas although technically nonspecific. Renal atrophy and scarring, left greater than right. Hypodense lesions of the kidneys (left greater than right) are likely cysts but many are technically too small to characterize. Stomach/Bowel: Descending and sigmoid colon diverticulosis without active diverticulitis identified. Prominent stool in the rectal vault, fecal impaction not excluded. No dilated small bowel. Vascular/Lymphatic: Aortoiliac atherosclerotic vascular disease. Stenosis of the right proximal superficial femoral artery. Stenosis of the left proximal common iliac artery. Reproductive: Mild prostatomegaly. Other: No supplemental non-categorized findings. Musculoskeletal: Small lipoma along the right anterior abdominal wall musculature superficial to the lower rib cage, image 24 series 2. Old healed fractures of right transverse processes at L3 and L4. Large superior endplate Schmorl's nodes at T12 and L1. Grade 1 degenerative anterolisthesis at L4-5. Lumbar spondylosis and degenerative disc disease cause right foraminal impingement at L4-5 and left foraminal impingement at L5-S1. IMPRESSION: 1. Partially included 6.9 by 3.6 cm posterior filling defect in the left atrium, suspicious for thrombus, tumor not completely excluded. This could predispose to systemic arterial emboli. Further characterization with trans-esophageal echocardiography (TEE) is recommended. 2. Mild to moderate cardiomegaly. 3. Descending and sigmoid colon diverticulosis. 4. Stable (from 2011) bilateral low-density adrenal masses, probably adenomas although technically  nonspecific. 5. Renal atrophy and scarring, left greater than right. Hypodense lesions of the kidneys (left greater than right) are likely cysts but many are technically too small to characterize. 6. Prominent stool in the rectal vault, fecal impaction not excluded. 7. Lumbar spondylosis and degenerative disc disease causing right foraminal impingement at L4-5 and left foraminal impingement at L5-S1. 8. Aortic atherosclerosis. Vascular stenoses of the left proximal common iliac artery and right proximal SFA. Aortic Atherosclerosis (ICD10-I70.0). Critical Value/emergent results were called by telephone at the time of interpretation on 01/23/2020 at 1:17 pm to provider Dr. Dionne Bucy , who verbally acknowledged these results. Electronically Signed   By: Gaylyn Rong M.D.   On: 01/23/2020 13:17      Management plans discussed with the patient, family and they are in agreement.  CODE STATUS:     Code Status Orders  (From admission, onward)         Start     Ordered   01/23/20 1723  Do not attempt resuscitation (DNR)  Continuous       Question Answer Comment  In the event of cardiac or respiratory ARREST Do  not call a "code blue"   In the event of cardiac or respiratory ARREST Do not perform Intubation, CPR, defibrillation or ACLS   In the event of cardiac or respiratory ARREST Use medication by any route, position, wound care, and other measures to relive pain and suffering. May use oxygen, suction and manual treatment of airway obstruction as needed for comfort.      01/23/20 1723        Code Status History    Date Active Date Inactive Code Status Order ID Comments User Context   10/16/2019 1843 10/23/2019 0043 DNR [id number]  Lynn Ito, MD ED   10/16/2019 1834 10/16/2019 1843 Full Code [id number]  Lynn Ito, MD ED   12/19/2017 1906 12/22/2017 1918 Full Code [id number]  Shaune Pollack, MD Inpatient   12/19/2017 1501 12/19/2017 1906 DNR [id number]  Shaune Pollack, MD ED   06/14/2015 1044  06/18/2015 1710 DNR [id number]  Enedina Finner, MD Inpatient   06/14/2015 0259 06/14/2015 1044 Full Code [id number]  Ihor Austin, MD ED   Advance Care Planning Activity      TOTAL TIME TAKING CARE OF THIS PATIENT: 35 minutes.    Alford Highland M.D on 01/24/2020 at 11:32 AM  Between 7am to 6pm - Pager - [phone]  After 6pm go to [email or web address] - password [email or web address]  [facility]  CC: Primary care physician; Marletta Lor, NP

## 2020-01-24 NOTE — TOC Transition Note (Signed)
Transition of Care [facility]) - CM/SW Discharge Note   Patient Details  Name: Lee Gonzalez MRN: [id number] Date of Birth: [birth year]  [facility] CM/SW Contact:  Margarito Liner, LCSW Phone Number: 01/24/2020, 11:53 AM   Clinical Narrative: Patient has orders to discharge home today and resume hospice services with [facility]. EMS transport set up with [facility] for 2:00. No further concerns. CSW signing off.  Final next level of care: Home w Hospice Care Barriers to Discharge: No Barriers Identified   Patient Goals and CMS Choice Patient states their goals for this hospitalization and ongoing recovery are:: Wife and granddaughter state they are interested in Long term care CMS Medicare.gov Compare Post Acute Care list provided to:: Patient Represenative (must comment) Choice offered to / list presented to : Spouse  Discharge Placement                Patient to be transferred to facility by: [facility] Name of family member notified: Ileana Roup Patient and family notified of of transfer: 01/24/20  Discharge Plan and Services     Post Acute Care Choice: Nursing Home                               Social Determinants of Health (SDOH) Interventions     Readmission Risk Interventions No flowsheet data found.

## 2020-01-24 NOTE — Discharge Instructions (Signed)
Hyperglycemia Hyperglycemia occurs when the level of sugar (glucose) in the blood is too high. Glucose is a type of sugar that provides the body's main source of energy. Certain hormones (insulin and glucagon) control the level of glucose in the blood. Insulin lowers blood glucose, and glucagon increases blood glucose. Hyperglycemia can result from having too little insulin in the bloodstream, or from the body not responding normally to insulin. Hyperglycemia occurs most often in people who have diabetes (diabetes mellitus), but it can happen in people who do not have diabetes. It can develop quickly, and it can be life-threatening if it causes you to become severely dehydrated (diabetic ketoacidosis or hyperglycemic hyperosmolar state). Severe hyperglycemia is a medical emergency. What are the causes? If you have diabetes, hyperglycemia may be caused by:  Diabetes medicine.  Medicines that increase blood glucose or affect your diabetes control.  Not eating enough, or not eating often enough.  Changes in physical activity level.  Being sick or having an infection. If you have prediabetes or undiagnosed diabetes:  Hyperglycemia may be caused by those conditions. If you do not have diabetes, hyperglycemia may be caused by:  Certain medicines, including steroid medicines, beta-blockers, epinephrine, and thiazide diuretics.  Stress.  Serious illness.  Surgery.  Diseases of the pancreas.  Infection. What increases the risk? Hyperglycemia is more likely to develop in people who have risk factors for diabetes, such as:  Having a family member with diabetes.  Having a gene for type 1 diabetes that is passed from parent to child (inherited).  Living in an area with cold weather conditions.  Exposure to certain viruses.  Certain conditions in which the body's disease-fighting (immune) system attacks itself (autoimmune disorders).  Being overweight or obese.  Having an inactive  (sedentary) lifestyle.  Having been diagnosed with insulin resistance.  Having a history of prediabetes, gestational diabetes, or polycystic ovarian syndrome (PCOS).  Being of American-Indian, African-American, Hispanic/Latino, or Asian/Pacific Islander descent. What are the signs or symptoms? Hyperglycemia may not cause any symptoms. If you do have symptoms, they may include early warning signs, such as:  Increased thirst.  Hunger.  Feeling very tired.  Needing to urinate more often than usual.  Blurry vision. Other symptoms may develop if hyperglycemia gets worse, such as:  Dry mouth.  Loss of appetite.  Fruity-smelling breath.  Weakness.  Unexpected or rapid weight gain or weight loss.  Tingling or numbness in the hands or feet.  Headache.  Skin that does not quickly return to normal after being lightly pinched and released (poor skin turgor).  Abdominal pain.  Cuts or bruises that are slow to heal. How is this diagnosed? Hyperglycemia is diagnosed with a blood test to measure your blood glucose level. This blood test is usually done while you are having symptoms. Your health care provider may also do a physical exam and review your medical history. You may have more tests to determine the cause of your hyperglycemia, such as:  A fasting blood glucose (FBG) test. You will not be allowed to eat (you will fast) for at least 8 hours before a blood sample is taken.  An A1c (hemoglobin A1c) blood test. This provides information about blood glucose control over the previous 2-3 months.  An oral glucose tolerance test (OGTT). This measures your blood glucose at two times: ? After fasting. This is your baseline blood glucose level. ? Two hours after drinking a beverage that contains glucose. How is this treated? Treatment depends on the cause   of your hyperglycemia. Treatment may include:  Taking medicine to regulate your blood glucose levels. If you take insulin or  other diabetes medicines, your medicine or dosage may be adjusted.  Lifestyle changes, such as exercising more, eating healthier foods, or losing weight.  Treating an illness or infection, if this caused your hyperglycemia.  Checking your blood glucose more often.  Stopping or reducing steroid medicines, if these caused your hyperglycemia. If your hyperglycemia becomes severe and it results in hyperglycemic hyperosmolar state, you must be hospitalized and given IV fluids. Follow these instructions at home:  General instructions  Take over-the-counter and prescription medicines only as told by your health care provider.  Do not use any products that contain nicotine or tobacco, such as cigarettes and e-cigarettes. If you need help quitting, ask your health care provider.  Limit alcohol intake to no more than 1 drink per day for nonpregnant women and 2 drinks per day for men. One drink equals 12 oz of beer, 5 oz of wine, or 1 oz of hard liquor.  Learn to manage stress. If you need help with this, ask your health care provider.  Keep all follow-up visits as told by your health care provider. This is important. Eating and drinking   Maintain a healthy weight.  Exercise regularly, as directed by your health care provider.  Stay hydrated, especially when you exercise, get sick, or spend time in hot temperatures.  Eat healthy foods, such as: ? Lean proteins. ? Complex carbohydrates. ? Fresh fruits and vegetables. ? Low-fat dairy products. ? Healthy fats.  Drink enough fluid to keep your urine clear or pale yellow. If you have diabetes:  Make sure you know the symptoms of hyperglycemia.  Follow your diabetes management plan, as told by your health care provider. Make sure you: ? Take your insulin and medicines as directed. ? Follow your exercise plan. ? Follow your meal plan. Eat on time, and do not skip meals. ? Check your blood glucose as often as directed. Make sure to  check your blood glucose before and after exercise. If you exercise longer or in a different way than usual, check your blood glucose more often. ? Follow your sick day plan whenever you cannot eat or drink normally. Make this plan in advance with your health care provider.  Share your diabetes management plan with people in your workplace, school, and household.  Check your urine for ketones when you are ill and as told by your health care provider.  Carry a medical alert card or wear medical alert jewelry. Contact a health care provider if:  Your blood glucose is at or above 240 mg/dL (13.3 mmol/L) for 2 days in a row.  You have problems keeping your blood glucose in your target range.  You have frequent episodes of hyperglycemia. Get help right away if:  You have difficulty breathing.  You have a change in how you think, feel, or act (mental status).  You have nausea or vomiting that does not go away. These symptoms may represent a serious problem that is an emergency. Do not wait to see if the symptoms will go away. Get medical help right away. Call your local emergency services (911 in the [place] Do not drive yourself to the hospital. Summary  Hyperglycemia occurs when the level of sugar (glucose) in the blood is too high.  Hyperglycemia is diagnosed with a blood test to measure your blood glucose level. This blood test is usually done while you are   having symptoms. Your health care provider may also do a physical exam and review your medical history.  If you have diabetes, follow your diabetes management plan as told by your health care provider.  Contact your health care provider if you have problems keeping your blood glucose in your target range. This information is not intended to replace advice given to you by your health care provider. Make sure you discuss any questions you have with your health care provider. Document Revised: 10/19/2015 Document Reviewed:  10/19/2015 [employer] Patient Education  2020 [employer]  

## 2020-03-17 DEATH — deceased

## 2021-01-13 IMAGING — CT CT HEAD W/O CM
3 of 4 series · 16 of 47 positions shown, 19 images · non-contrast
Comparison: 10/16/2019

CLINICAL DATA: Mental status changes

EXAM:
CT HEAD WITHOUT CONTRAST
TECHNIQUE: Contiguous axial images were obtained from the base of the skull
through the vertex without intravenous contrast.

[Series 3: coronal soft tissue · coronal · 0.31mm/px · 3 of 74 slices shown]
[im 25/74  brain]
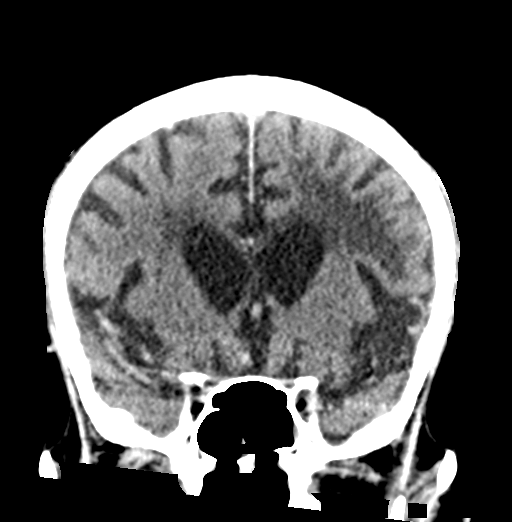
[im 33/74  brain]
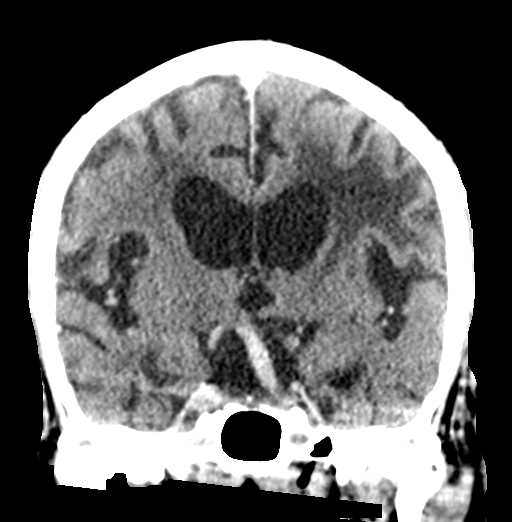
[im 41/74  brain]
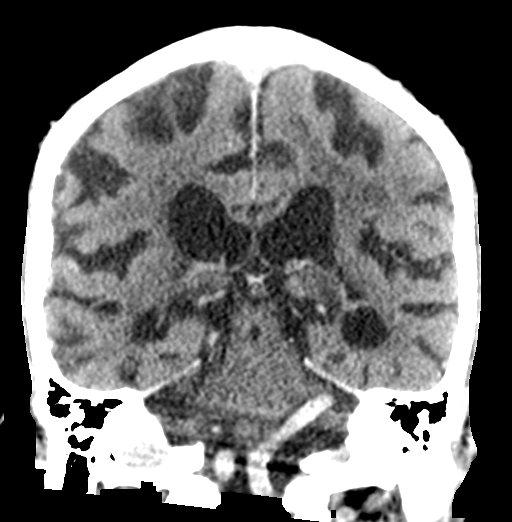

[Series 4: sagittal soft tissue · sagittal · 0.33mm/px · 3 of 52 slices shown]
[im 18/52  brain]
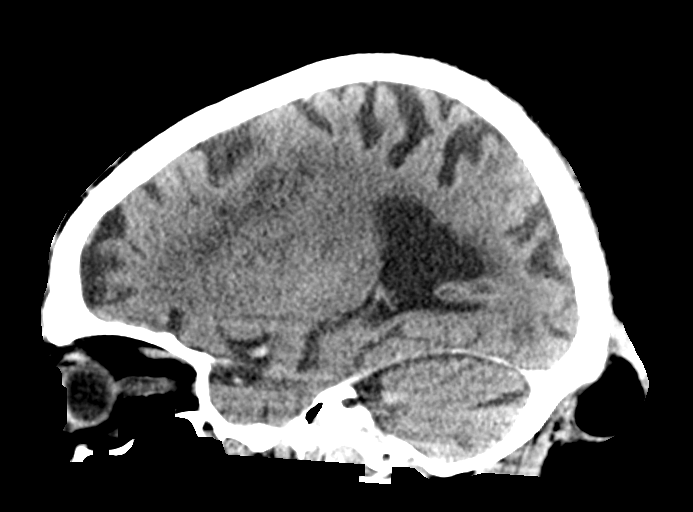
[im 26/52  brain]
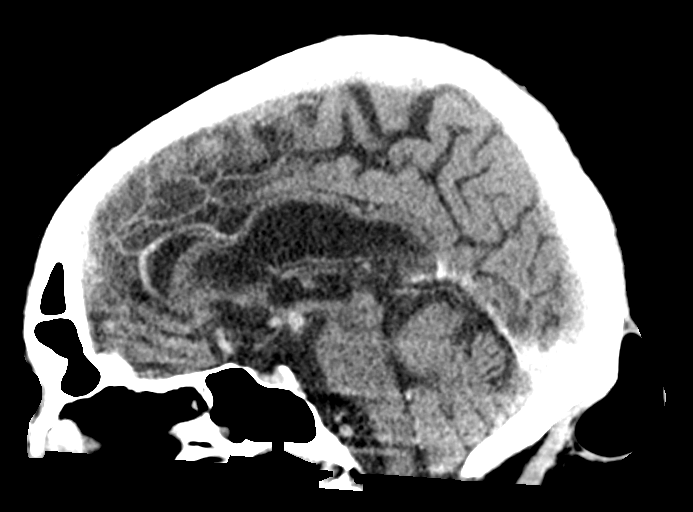
[im 35/52  brain]
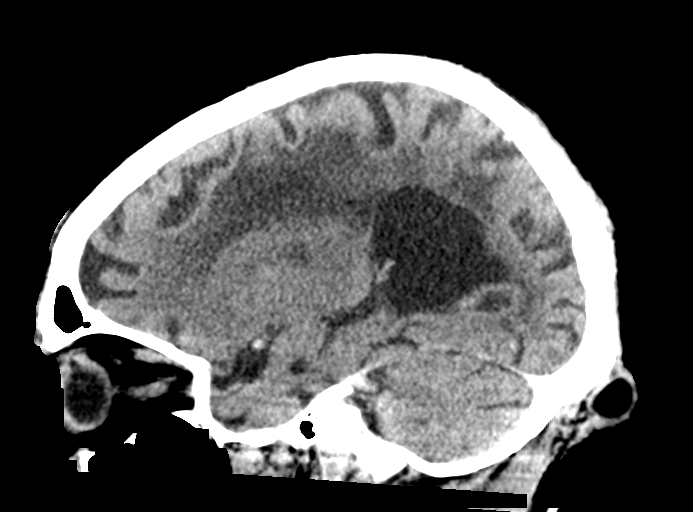

[Series 5: head wo · axial · 0.46mm/px · z∈[+499,+629]mm · 10 of 32 slices shown, 13 images]
[im 3/32  brain]
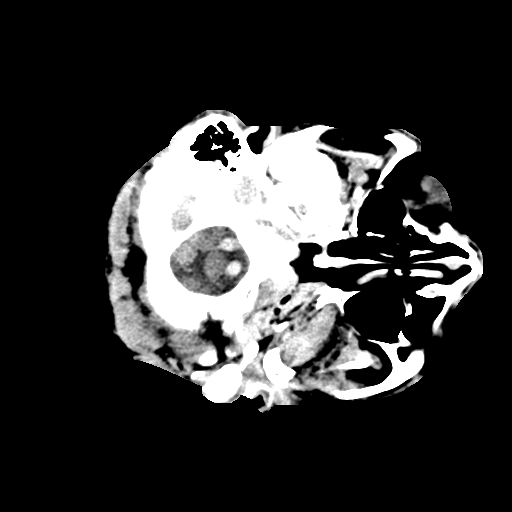
[im 3/32  bone]
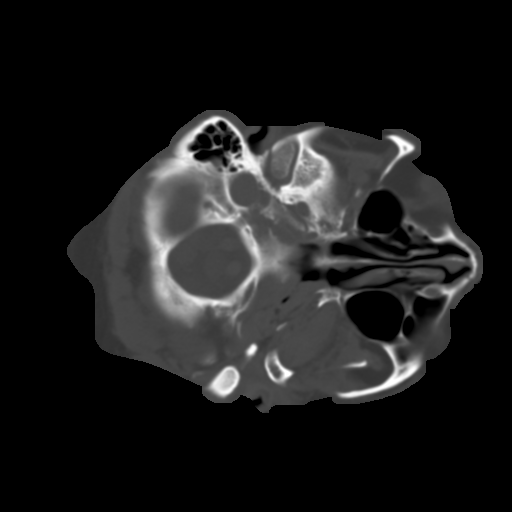
[im 5/32  brain]
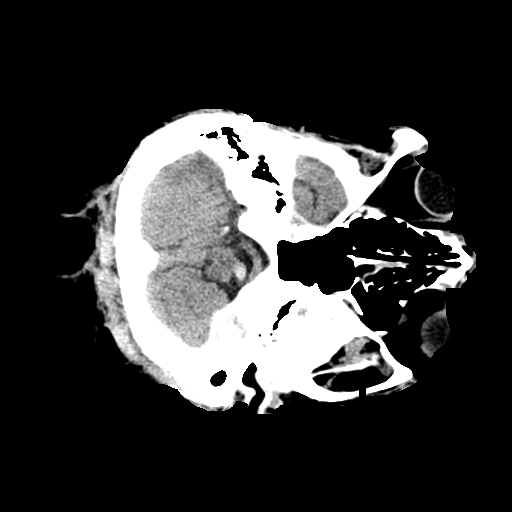
[im 9/32  brain]
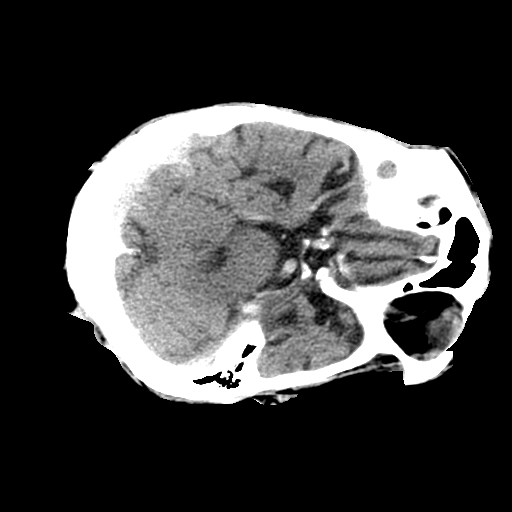
[im 12/32  brain]
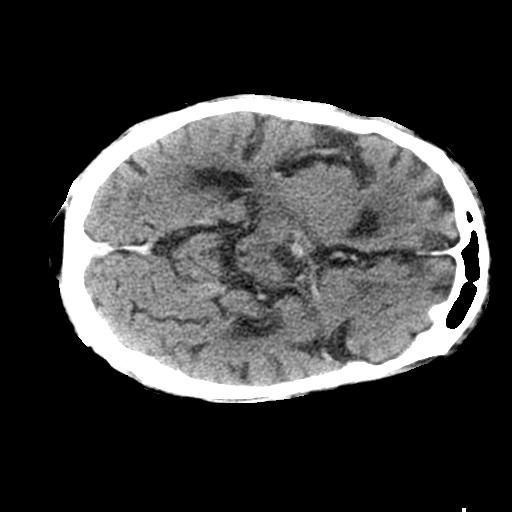
[im 14/32  brain]
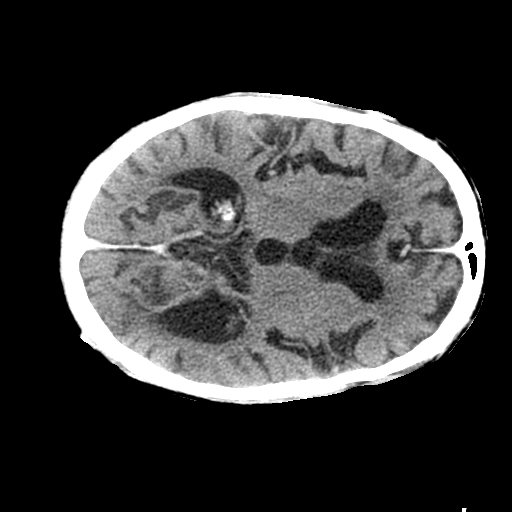
[im 14/32  bone]
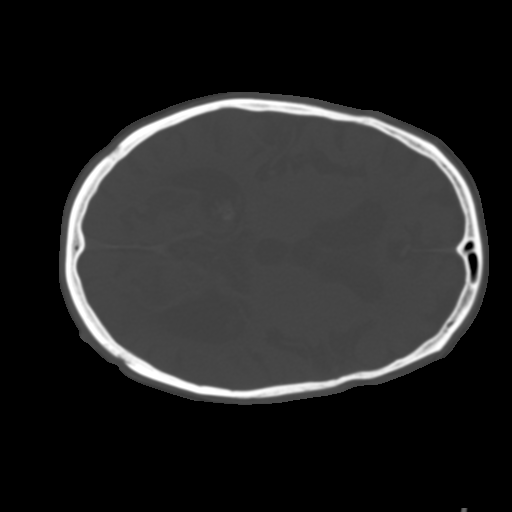
[im 18/32  brain]
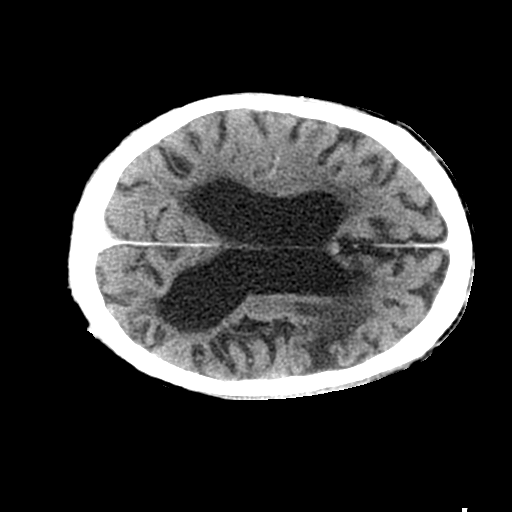
[im 20/32  brain]
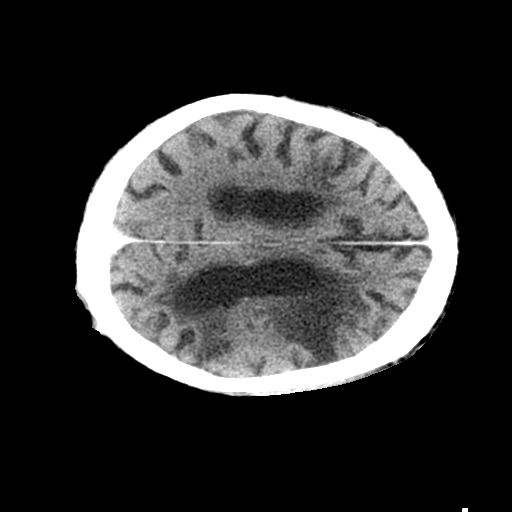
[im 23/32  brain]
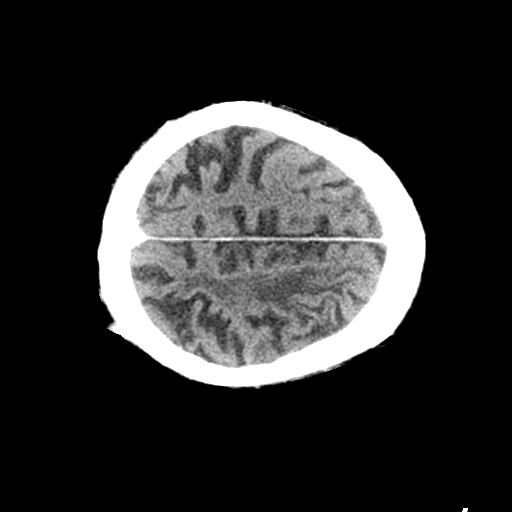
[im 27/32  brain]
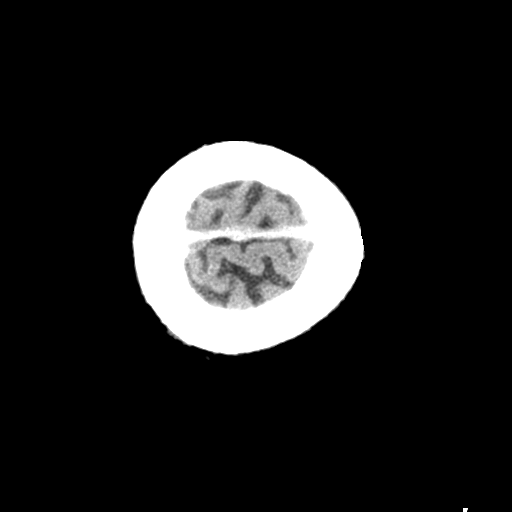
[im 27/32  bone]
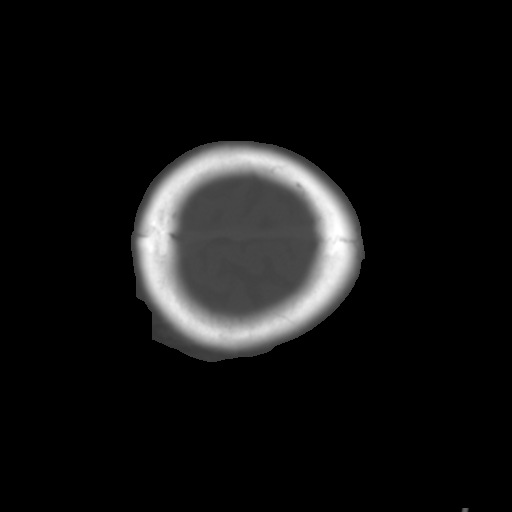
[im 29/32  brain]
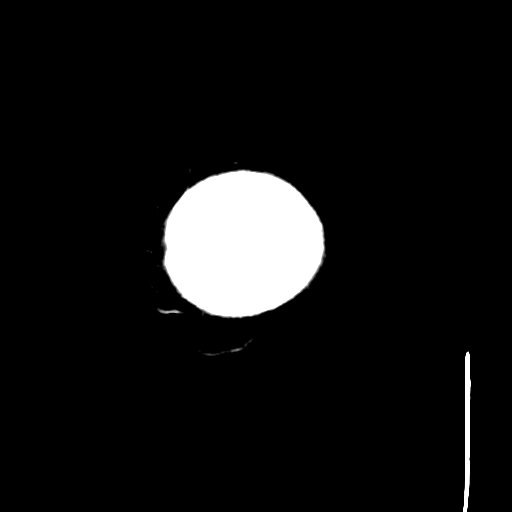

[16 of 47 positions shown; findings below may reference images not displayed]

FINDINGS: Brain: Old left MCA infarct, unchanged. There is atrophy and chronic
small vessel disease changes. Associated ventriculomegaly. No acute
intracranial abnormality. Specifically, no hemorrhage,
hydrocephalus, mass lesion, acute infarction, or significant
intracranial injury.

Vascular: No hyperdense vessel or unexpected calcification.

Skull: No acute calvarial abnormality.

Sinuses/Orbits: No acute findings

Other: None
IMPRESSION: Old left MCA infarct.

Atrophy, chronic microvascular disease.

No acute intracranial abnormality.

## 2022-01-03 ENCOUNTER — Other Ambulatory Visit: Payer: Self-pay

## 2022-01-04 ENCOUNTER — Other Ambulatory Visit: Payer: Self-pay
